# Patient Record
Sex: Female | Born: 1935 | Race: Black or African American | Hispanic: No | Smoking: Never smoker
Health system: Southern US, Community
[De-identification: ages and names within clinical notes are randomized; demographics above are authoritative.]

## PROBLEM LIST (undated history)

## (undated) ENCOUNTER — Emergency Department (HOSPITAL_COMMUNITY): Payer: Medicare Other | Source: Home / Self Care

## (undated) DIAGNOSIS — M1990 Unspecified osteoarthritis, unspecified site: Secondary | ICD-10-CM

## (undated) DIAGNOSIS — I2510 Atherosclerotic heart disease of native coronary artery without angina pectoris: Secondary | ICD-10-CM

## (undated) DIAGNOSIS — I219 Acute myocardial infarction, unspecified: Secondary | ICD-10-CM

## (undated) DIAGNOSIS — E039 Hypothyroidism, unspecified: Secondary | ICD-10-CM

## (undated) DIAGNOSIS — I639 Cerebral infarction, unspecified: Secondary | ICD-10-CM

## (undated) DIAGNOSIS — I509 Heart failure, unspecified: Secondary | ICD-10-CM

## (undated) DIAGNOSIS — I4891 Unspecified atrial fibrillation: Secondary | ICD-10-CM

## (undated) HISTORY — PX: TONSILLECTOMY: SUR1361

## (undated) HISTORY — PX: EYE SURGERY: SHX253

---

## 2002-09-21 ENCOUNTER — Encounter: Payer: Self-pay | Admitting: Ophthalmology

## 2002-09-24 ENCOUNTER — Ambulatory Visit (HOSPITAL_COMMUNITY): Admission: RE | Admit: 2002-09-24 | Discharge: 2002-09-25 | Payer: Self-pay | Admitting: Ophthalmology

## 2003-03-09 ENCOUNTER — Encounter: Payer: Self-pay | Admitting: Family Medicine

## 2003-03-09 ENCOUNTER — Ambulatory Visit (HOSPITAL_COMMUNITY): Admission: RE | Admit: 2003-03-09 | Discharge: 2003-03-09 | Payer: Self-pay | Admitting: Family Medicine

## 2003-07-08 ENCOUNTER — Ambulatory Visit (HOSPITAL_COMMUNITY): Admission: RE | Admit: 2003-07-08 | Discharge: 2003-07-08 | Payer: Self-pay | Admitting: Ophthalmology

## 2004-01-20 ENCOUNTER — Ambulatory Visit (HOSPITAL_COMMUNITY): Admission: RE | Admit: 2004-01-20 | Discharge: 2004-01-20 | Payer: Self-pay | Admitting: Ophthalmology

## 2005-05-16 ENCOUNTER — Ambulatory Visit (HOSPITAL_COMMUNITY): Admission: RE | Admit: 2005-05-16 | Discharge: 2005-05-16 | Payer: Self-pay | Admitting: Ophthalmology

## 2007-10-08 ENCOUNTER — Ambulatory Visit (HOSPITAL_COMMUNITY): Payer: Self-pay | Admitting: Family Medicine

## 2007-10-08 ENCOUNTER — Encounter (HOSPITAL_COMMUNITY): Admission: RE | Admit: 2007-10-08 | Discharge: 2007-11-07 | Payer: Self-pay | Admitting: Family Medicine

## 2008-02-23 ENCOUNTER — Ambulatory Visit (HOSPITAL_COMMUNITY): Payer: Self-pay | Admitting: Family Medicine

## 2008-02-23 ENCOUNTER — Encounter (HOSPITAL_COMMUNITY): Admission: RE | Admit: 2008-02-23 | Discharge: 2008-03-24 | Payer: Self-pay | Admitting: Family Medicine

## 2009-05-03 ENCOUNTER — Emergency Department (HOSPITAL_COMMUNITY): Admission: EM | Admit: 2009-05-03 | Discharge: 2009-05-04 | Payer: Self-pay | Admitting: Emergency Medicine

## 2010-12-16 LAB — GLUCOSE, CAPILLARY: Glucose-Capillary: 166 mg/dL — ABNORMAL HIGH (ref 70–99)

## 2011-01-26 NOTE — Op Note (Signed)
NAME:  [name], [name]                         ACCOUNT NO.:  [id number]   MEDICAL RECORD NO.:  [id number]                   PATIENT TYPE:  OIB   LOCATION:  2550                                 FACILITY:  [facility]   PHYSICIAN:  Salley Scarlet., M.D.         DATE OF BIRTH:  1921-09-08   DATE OF PROCEDURE:  01/20/2004  DATE OF DISCHARGE:  01/20/2004                                 OPERATIVE REPORT   PREOPERATIVE DIAGNOSES:  1. Glaucoma, poor control, right eye.  2. Cataract, right eye.   POSTOPERATIVE DIAGNOSES:  1. Glaucoma, poor control, right eye.  2. Cataract right eye.   OPERATION:  Trabeculectomy right eye, with extracapsular cataract extraction  and intraocular lens implantation, right eye.   ANESTHESIA:  Local using Xylocaine 2%, Marcaine 0.75%.   PROCEDURE:  Under the influence of IV sedation, a Van Lint akinesia and  retrobulbar anesthesia was given.  The patient was prepped and draped in the  usual manner.  The lid speculum was inserted under the upper and lower lid  of the right eye and a 4-0 silk traction suture was passed through the belly  of the superior rectus muscle for traction.  A limbal-based conjunctival  flap was turned and hemostasis achieved by using cautery.  A triangular  scleral flap was fashioned, having its base hinged at the limbus and being  about 6 mm in length.  After fashioning this flap, a Weck-Cel pledget soaked  in mitomycin C was allowed to sit on the sclera beneath the conjunctiva for  approximately two minutes.  The eye was then thoroughly irrigated so as to  irrigate the mitomycin C from the sclera.  The triangular scleral flap was  then dissected down into clear cornea using the crescent blade.  An incision  was made in the conjunctiva at the limbus at the 11 o'clock position.  Occucoat was injected into the eye through this incision.  An anterior  capsulotomy was done using a bent 25-gauge needle.  Then beneath the  triangular scleral  flap, a 3 x 3 section of tissue was excised so as to  remove the trabeculum and to enter the anterior chamber.  The corneoscleral  wound was then extended first toward the left, then toward the right, using  corneal scissors and placing an 8-0 Vicryl scissor across each arm of the  incision that was made respectively toward the left, then toward the right.  A third suture was placed at the apex of the triangular flap at the 12  o'clock position.  At this point the patient began to thrash around  uncontrollably and the decision was made to intubate the patient.  The  anesthesiologist was called, and the patient was intubated without  complications and the procedure was continued without complications.  The  cataract was then manually expressed from the eye and the two previously-  placed sutures were closed and the third one placed at the  12 o'clock  position.  The I/A handpiece was passed into the eye and the residual  cortical material was aspirated.  The posterior capsule was polished using  an olive-tipped polisher.  A posterior chamber lens of the EZ60 variety was  inserted into the eye behind the iris without difficulty.  The corneoscleral  wound was closed by using interrupted sutures of 8-0 Vicryl and 10-0 nylon.  The anterior chamber was reformed and the pupil was constricted using  Miochol.  Before closing the final suture, Occucoat was injected into the  eye so as to make sure that the anterior chamber stayed formed in the  immediate postop period.  After making sure that the wound was secure,  Tenon's capsule and the conjunctiva were closed over the sclera using a  running suture of 8-0 Vicryl.  Tenon's and conjunctiva were closed in  layers.  Celestone 1 mL and 0.5 mL of gentamicin were injected  subconjuctivally.  Maxitrol ophthalmic ointment and Pilopine ointment were  applied along with a patch and Fox shield.  The patient tolerated the  procedure well and was discharged to  the postanesthesia recovery room in  satisfactory condition.  She will be discharged when stable with  instructions to rest in bed today, to take Vicodin every four hours as  needed for pain, and to see me in the office tomorrow for further  evaluation.   DISCHARGE DIAGNOSES:  1. Glaucoma, poor control, right eye.  2. Cataract, right eye.                                               Salley Scarlet., M.D.    TB/MEDQ  D:  01/20/2004  T:  01/22/2004  Job:  [id number]

## 2011-01-26 NOTE — Op Note (Signed)
NAME:  [name], [name]                           ACCOUNT NO.:  [id number]   MEDICAL RECORD NO.:  [id number]                   PATIENT TYPE:  OIB   LOCATION:  5735                                 FACILITY:  [facility]   PHYSICIAN:  Salley Scarlet., M.D.         DATE OF BIRTH:  15-Feb-1921   DATE OF PROCEDURE:  DATE OF DISCHARGE:  09/25/2002                                 OPERATIVE REPORT   PREOPERATIVE DIAGNOSES:  1. Glaucoma poorly controlled left eye.  2. Cataract, left eye.   OPERATION:  1. Trabeculectomy, left eye.  2. Extracapsular extraction with intraoperative lens implantation, left eye.   SURGEON:  Nadyne Coombes, M.D.   ANESTHESIA:  Local using Xylocaine 2%, Marcaine 0.75% and Wydase.   JUSTIFICATION FOR PROCEDURE:  This is a 77 year old lady who has been  followed for several years for glaucoma.  She is on maximum tolerated  medical therapy but the pressure is still poorly controlled.  She complains  of blurring of vision.  She has developed a cataract over the course of  several years so that now she has a visual acuity best corrected of 20/50 on  the right and 20/70 on the left.  Trabeculectomy with cataract extraction  was recommended.  She is admitted at this time to undergo this procedure.   DESCRIPTION OF PROCEDURE:  Under the influence of IV sedation a Van Lint  akinesia and retrobulbar anesthesia were given.  The patient was prepped and  draped in the usual manner.  The lid speculum was inserted under the upper  and lower lid of the left eye and a 4-0 silk traction suture was passed  through the belly of the superior rectus muscle.  A limbal based  conjunctival flap was turned and hemostasis was achieved by using cautery.  A triangular scleral flap was fashioned in the sclerae approximately 1/2  thickness.  After fashioning the flap, a [place] pledget which had been  soaked in Mitomycin C was allowed to sit on the sclera beneath the  conjunctiva for  approximately two minutes.  The Mitomycin C was then  thoroughly irrigated from the eye.  The scleral flap was finished and  dissected down into clear cornea using crescent blade.  Then a 3 x 4 section  of tissue was removed from beneath the scleral flap containing the  trabeculum and entering the anterior chamber.  A peripheral iridectomy was  made in the eye.  The corneoscleral wound was then extended first toward the  right and then toward the left using corneal scissors.  A single 8-0 Vicryl  suture across the incision was made, towards the left and towards the right.  An anterior capsulotomy was done using bent 25 gauge needle.  The nucleus  was then manually expressed from the eye.  The two previously placed sutures  were closed and a third one was placed at the 12:00 position.  The eye hand  piece was passed into the eye and the residual cortical material was  aspirated.  The posterior capsule was polished using Olk's polisher. A PMMA  lens was then inserted into the eye behind the iris without difficulty.  The  anterior chamber was reformed using Miochol.  The corneoscleral wound was  closed by using a combination of interrupted sutures of 8-0 Vicryl and 10-0  Nylon packing each apex of the triangle scleral fat now down with 10-0  Nylon.  After it was ascertained that the wound was air tight but allowing  fluid to filter through the scleral wound, the conjunctiva was closed over  the sclera using running suture of 8-0 Vicryl.  Then 1 cc of Celestone and  0.25 cc of Gentamicin were injected subconjuctivally.  Atropine drops and  Maxitrol ointment were applied along with a pad and Fox shield.  The patient  tolerated the procedure well and was discharged to the postanesthesia care  unit in satisfactory condition.  She has been instructed to rest today.  Take Vicodin every four hours as-needed for pain.  To be seen in the office  tomorrow for further evaluation.   DISCHARGE  DIAGNOSES:  1. Glaucoma, poorly controlled.  2. Immature cataract, left eye.                                               Salley Scarlet., M.D.    TB/MEDQ  D:  09/30/2002  T:  10/01/2002  Job:  [id number]

## 2011-06-18 ENCOUNTER — Ambulatory Visit (HOSPITAL_COMMUNITY)
Admission: RE | Admit: 2011-06-18 | Discharge: 2011-06-18 | Disposition: A | Discharge status: Home / Self Care | Payer: Medicare Other | Source: Ambulatory Visit | Attending: Ophthalmology | Admitting: Ophthalmology

## 2011-06-18 ENCOUNTER — Encounter (HOSPITAL_COMMUNITY)
Admission: RE | Admit: 2011-06-18 | Discharge: 2011-06-18 | Disposition: A | Discharge status: Home / Self Care | Payer: Medicare Other | Source: Ambulatory Visit | Attending: Ophthalmology | Admitting: Ophthalmology

## 2011-06-18 ENCOUNTER — Other Ambulatory Visit (HOSPITAL_COMMUNITY): Payer: Self-pay | Admitting: Ophthalmology

## 2011-06-18 DIAGNOSIS — I10 Essential (primary) hypertension: Secondary | ICD-10-CM

## 2011-06-18 DIAGNOSIS — R5383 Other fatigue: Secondary | ICD-10-CM | POA: Insufficient documentation

## 2011-06-18 DIAGNOSIS — R5381 Other malaise: Secondary | ICD-10-CM | POA: Insufficient documentation

## 2011-06-18 DIAGNOSIS — Z01818 Encounter for other preprocedural examination: Secondary | ICD-10-CM | POA: Insufficient documentation

## 2011-06-18 DIAGNOSIS — R0602 Shortness of breath: Secondary | ICD-10-CM | POA: Insufficient documentation

## 2011-06-18 DIAGNOSIS — I517 Cardiomegaly: Secondary | ICD-10-CM | POA: Insufficient documentation

## 2011-06-18 LAB — BASIC METABOLIC PANEL
CO2: 25 mEq/L (ref 19–32)
Calcium: 10.5 mg/dL (ref 8.4–10.5)
GFR calc non Af Amer: 72 mL/min — ABNORMAL LOW (ref >90)
Potassium: 4.4 mEq/L (ref 3.5–5.1)
Sodium: 140 mEq/L (ref 135–145)

## 2011-06-18 LAB — CBC
HCT: 36.4 % (ref 36.0–46.0)
Hemoglobin: 12.3 g/dL (ref 12.0–15.0)
MCH: 33.3 pg (ref 26.0–34.0)
MCHC: 33.8 g/dL (ref 30.0–36.0)
MCV: 98.6 fL (ref 78.0–100.0)
Platelets: 152 10*3/uL (ref 150–400)
RBC: 3.69 MIL/uL — ABNORMAL LOW (ref 3.87–5.11)
RDW: 16 % — ABNORMAL HIGH (ref 11.5–15.5)
WBC: 7.7 10*3/uL (ref 4.0–10.5)

## 2011-06-18 LAB — SURGICAL PCR SCREEN
MRSA, PCR: NEGATIVE
Staphylococcus aureus: NEGATIVE

## 2011-06-18 LAB — PROTIME-INR: Prothrombin Time: 21.7 seconds — ABNORMAL HIGH (ref 11.6–15.2)

## 2011-06-18 LAB — APTT: aPTT: 40 seconds — ABNORMAL HIGH (ref 24–37)

## 2011-06-20 ENCOUNTER — Observation Stay (HOSPITAL_COMMUNITY)
Admission: RE | Admit: 2011-06-20 | Discharge: 2011-06-21 | Disposition: A | Discharge status: Home / Self Care | Payer: Medicare Other | Source: Ambulatory Visit | Attending: Ophthalmology | Admitting: Ophthalmology

## 2011-06-20 DIAGNOSIS — Z01818 Encounter for other preprocedural examination: Secondary | ICD-10-CM | POA: Insufficient documentation

## 2011-06-20 DIAGNOSIS — E119 Type 2 diabetes mellitus without complications: Secondary | ICD-10-CM | POA: Insufficient documentation

## 2011-06-20 DIAGNOSIS — Z01810 Encounter for preprocedural cardiovascular examination: Secondary | ICD-10-CM | POA: Insufficient documentation

## 2011-06-20 DIAGNOSIS — E039 Hypothyroidism, unspecified: Secondary | ICD-10-CM | POA: Insufficient documentation

## 2011-06-20 DIAGNOSIS — I517 Cardiomegaly: Secondary | ICD-10-CM | POA: Insufficient documentation

## 2011-06-20 DIAGNOSIS — H4020X0 Unspecified primary angle-closure glaucoma, stage unspecified: Principal | ICD-10-CM | POA: Insufficient documentation

## 2011-06-20 DIAGNOSIS — Z01812 Encounter for preprocedural laboratory examination: Secondary | ICD-10-CM | POA: Insufficient documentation

## 2011-06-20 DIAGNOSIS — I4891 Unspecified atrial fibrillation: Secondary | ICD-10-CM | POA: Insufficient documentation

## 2011-06-21 LAB — PROTIME-INR
INR: 1.53 — ABNORMAL HIGH (ref 0.00–1.49)
Prothrombin Time: 18.7 s — ABNORMAL HIGH (ref 11.6–15.2)

## 2011-06-21 LAB — GLUCOSE, CAPILLARY
Glucose-Capillary: 135 mg/dL — ABNORMAL HIGH (ref 70–99)
Glucose-Capillary: 107 mg/dL — ABNORMAL HIGH (ref 70–99)

## 2011-06-28 NOTE — Op Note (Signed)
NAMECAITLYNE, [name]               ACCOUNT NO.:  [id number]  MEDICAL RECORD NO.:  [id number]  LOCATION:                                 FACILITY:  PHYSICIAN:  Chalmers Guest, M.D.     DATE OF BIRTH:  [birth year]  DATE OF PROCEDURE:  06/20/2011 DATE OF DISCHARGE:                              OPERATIVE REPORT   PREOPERATIVE DIAGNOSIS:  Uncontrolled angle closure glaucoma, right eye associated with intraocular inflammation with pre-existing scarring from previous cataract surgery and previous glaucoma surgery.  POSTOPERATIVE DIAGNOSIS:  Uncontrolled angle closure glaucoma, right eye associated with intraocular inflammation with pre-existing scarring from previous cataract surgery and previous glaucoma surgery.  PROCEDURE:  Ahmed valve with mitomycin-C with Tutoplast graft.  COMPLICATIONS:  None.  ANESTHESIA:  Xylocaine 2% with epinephrine and 50:50 mix of 0.75% Marcaine with ampule of Wydase.  PROCEDURE IN DETAIL:  The patient was taken to the operating room, where a peribulbar block was given with the aforementioned local anesthetic agent.  Following this, pressure was applied to the eye and then the patient's face was prepped and draped in usual sterile fashion.  With the surgeon sitting superiorly, a 6-0 nylon suture was passed through clear cornea to infraduct the eye.  With the eye in intraductal position, it was noted that the superior nasal conjunctiva was extensively scarred.  An incision was made at the superior temporal limbus using a Hoskins forceps and a sharp Westcott scissors.  It was extended at the limbus using the blunt scissors.  A fornix based conjunctival flap was formed.  Bleeding was controlled with cautery. Following this, mitomycin-C 0.4 mg/mL was placed on a Gelfoam sponge and allowed to stay on the eye for 3 minutes and then irrigated with 50 mL of balanced salt solution.  At this point, the flexible plate Ahmed was taken from the packet and noted to  have no defects.  The Ahmed glaucoma valve was FP7 flexible plate, SN #[id number].  It was irrigated and it was noted that there was a hole in the tube line.  The tube was cut beneath the hole and then irrigated and appropriately planned.  It was then placed on the eye and measurements were made with the eyelets 8 mm from the corneoscleral limbus.  It was sutured to approximately 7 mm posterior to the superior temporal corneal scleral limbus using a 4-0 Mersilene suture on a spatula needle.  Following this, the patient was moving excessively during the case, therefore, the Tutoplast graft was taken from the packet.  The Tutoplast was fashioned to cover the tube. It was then irrigated.  The Tutoplast with reference [phone].  It was cut and then sewed to the sclera after the Ahmed tube had been placed into the eye.  The tube was placed in the eye after a 22-gauge needle tract was made into the anterior chamber passed in through the iris.  The tube was positioned above the iris and was not occluded.  It was sutured to the sclera with a 9-0 nylon suture.  Two interrupted nylon sutures were then used to suture the scleral patch graft.  A paracentesis tract was formed where Provisc was injected in the  eye.  Following this, the conjunctiva was then closed with 8-0 Vicryl.  The Vicryl suture was sutured on each corner and then sutured temporally.  BSS was injected in the anterior chamber to remove portions of the viscoelastic.  The bleb elevated and it was Seidel negative.  The subconjunctival injection of Kenalog 5 mg was given subconjunctivally in inferior nasal quadrant. Following this, topical TobraDex was placed on the eye.  A patch and Fox shield were placed.  The patient returned to the recovery area in stable condition.  The patient is 77 years old and has diabetes and other medical problems and was very uncomfortable at the end of the case necessitating admission to the  hospital.     Chalmers Guest, M.D.     RW/MEDQ  D:  06/20/2011  T:  06/21/2011  Job:  [id number]  Electronically Signed by Chalmers Guest M.D. on 06/28/2011 05:10:22 PM

## 2012-02-03 ENCOUNTER — Other Ambulatory Visit: Payer: Self-pay

## 2012-02-03 ENCOUNTER — Ambulatory Visit (HOSPITAL_COMMUNITY): Admit: 2012-02-03 | Payer: Self-pay | Admitting: Cardiology

## 2012-02-03 ENCOUNTER — Encounter (HOSPITAL_COMMUNITY): Payer: Self-pay | Admitting: *Deleted

## 2012-02-03 ENCOUNTER — Emergency Department (HOSPITAL_COMMUNITY): Payer: Medicare Other

## 2012-02-03 ENCOUNTER — Encounter (HOSPITAL_COMMUNITY)
Admission: EM | Disposition: A | Discharge status: Home / Self Care | Payer: Self-pay | Source: Home / Self Care | Attending: Cardiology

## 2012-02-03 ENCOUNTER — Inpatient Hospital Stay (HOSPITAL_COMMUNITY)
Admission: EM | Admit: 2012-02-03 | Discharge: 2012-02-05 | DRG: 248 | Disposition: A | Discharge status: Home / Self Care | Payer: Medicare Other | Attending: Cardiology | Admitting: Cardiology

## 2012-02-03 ENCOUNTER — Inpatient Hospital Stay (HOSPITAL_COMMUNITY): Payer: Medicare Other

## 2012-02-03 DIAGNOSIS — I10 Essential (primary) hypertension: Secondary | ICD-10-CM | POA: Diagnosis present

## 2012-02-03 DIAGNOSIS — I5021 Acute systolic (congestive) heart failure: Secondary | ICD-10-CM | POA: Diagnosis present

## 2012-02-03 DIAGNOSIS — I4891 Unspecified atrial fibrillation: Secondary | ICD-10-CM | POA: Diagnosis present

## 2012-02-03 DIAGNOSIS — I5023 Acute on chronic systolic (congestive) heart failure: Secondary | ICD-10-CM | POA: Diagnosis present

## 2012-02-03 DIAGNOSIS — M069 Rheumatoid arthritis, unspecified: Secondary | ICD-10-CM | POA: Diagnosis present

## 2012-02-03 DIAGNOSIS — I509 Heart failure, unspecified: Secondary | ICD-10-CM | POA: Diagnosis present

## 2012-02-03 DIAGNOSIS — E119 Type 2 diabetes mellitus without complications: Secondary | ICD-10-CM | POA: Diagnosis present

## 2012-02-03 DIAGNOSIS — E039 Hypothyroidism, unspecified: Secondary | ICD-10-CM | POA: Diagnosis present

## 2012-02-03 DIAGNOSIS — I252 Old myocardial infarction: Secondary | ICD-10-CM

## 2012-02-03 DIAGNOSIS — I2109 ST elevation (STEMI) myocardial infarction involving other coronary artery of anterior wall: Principal | ICD-10-CM | POA: Diagnosis present

## 2012-02-03 DIAGNOSIS — H409 Unspecified glaucoma: Secondary | ICD-10-CM | POA: Diagnosis present

## 2012-02-03 DIAGNOSIS — I213 ST elevation (STEMI) myocardial infarction of unspecified site: Secondary | ICD-10-CM

## 2012-02-03 DIAGNOSIS — I2510 Atherosclerotic heart disease of native coronary artery without angina pectoris: Secondary | ICD-10-CM | POA: Diagnosis present

## 2012-02-03 HISTORY — DX: Heart failure, unspecified: I50.9

## 2012-02-03 HISTORY — PX: PERCUTANEOUS CORONARY STENT INTERVENTION (PCI-S): SHX5485

## 2012-02-03 HISTORY — DX: Atherosclerotic heart disease of native coronary artery without angina pectoris: I25.10

## 2012-02-03 HISTORY — DX: Unspecified osteoarthritis, unspecified site: M19.90

## 2012-02-03 HISTORY — DX: Unspecified atrial fibrillation: I48.91

## 2012-02-03 HISTORY — DX: Hypothyroidism, unspecified: E03.9

## 2012-02-03 HISTORY — PX: CARDIAC CATHETERIZATION: SHX172

## 2012-02-03 HISTORY — DX: Acute myocardial infarction, unspecified: I21.9

## 2012-02-03 HISTORY — PX: LEFT HEART CATHETERIZATION WITH CORONARY ANGIOGRAM: SHX5451

## 2012-02-03 LAB — DIFFERENTIAL
Eosinophils Absolute: 0 10*3/uL (ref 0.0–0.7)
Eosinophils Relative: 0 % (ref 0–5)
Lymphocytes Relative: 28 % (ref 12–46)
Lymphs Abs: 2 10*3/uL (ref 0.7–4.0)
Monocytes Absolute: 0.5 10*3/uL (ref 0.1–1.0)
Monocytes Relative: 7 % (ref 3–12)

## 2012-02-03 LAB — POCT I-STAT TROPONIN I: Troponin i, poc: 0.08 ng/mL (ref 0.00–0.08)

## 2012-02-03 LAB — CBC
HCT: 35.5 % — ABNORMAL LOW (ref 36.0–46.0)
Hemoglobin: 11.8 g/dL — ABNORMAL LOW (ref 12.0–15.0)
MCH: 31.5 pg (ref 26.0–34.0)
MCV: 94.7 fL (ref 78.0–100.0)
RBC: 3.75 MIL/uL — ABNORMAL LOW (ref 3.87–5.11)

## 2012-02-03 LAB — POCT I-STAT, CHEM 8
Creatinine, Ser: 0.7 mg/dL (ref 0.50–1.10)
Hemoglobin: 13.3 g/dL (ref 12.0–15.0)
Potassium: 4.5 mEq/L (ref 3.5–5.1)
Sodium: 142 mEq/L (ref 135–145)
TCO2: 21 mmol/L (ref 0–100)

## 2012-02-03 LAB — MRSA PCR SCREENING: MRSA by PCR: NEGATIVE

## 2012-02-03 SURGERY — LEFT HEART CATHETERIZATION WITH CORONARY ANGIOGRAM

## 2012-02-03 MED ORDER — CARVEDILOL 3.125 MG PO TABS
3.1250 mg | ORAL_TABLET | Freq: Two times a day (BID) | ORAL | Status: DC
  Administered 2012-02-04 (×2): 3.125 mg via ORAL
  Filled 2012-02-03 (×5): qty 1

## 2012-02-03 MED ORDER — EPTIFIBATIDE 75 MG/100ML IV SOLN
1.0000 ug/kg/min | INTRAVENOUS | Status: AC
  Administered 2012-02-03: 1 ug/kg/min via INTRAVENOUS
  Filled 2012-02-03 (×2): qty 100

## 2012-02-03 MED ORDER — CLOPIDOGREL BISULFATE 300 MG PO TABS
ORAL_TABLET | ORAL | Status: AC
  Filled 2012-02-03: qty 2

## 2012-02-03 MED ORDER — METHOTREXATE 2.5 MG PO TABS
10.0000 mg | ORAL_TABLET | ORAL | Status: DC
  Administered 2012-02-03: 10 mg via ORAL
  Filled 2012-02-03: qty 4

## 2012-02-03 MED ORDER — BIMATOPROST 0.03 % OP SOLN
1.0000 [drp] | Freq: Every day | OPHTHALMIC | Status: DC
  Filled 2012-02-03: qty 2.5

## 2012-02-03 MED ORDER — SODIUM CHLORIDE 0.9 % IV SOLN
1.0000 mL/kg/h | INTRAVENOUS | Status: DC
  Administered 2012-02-03: 1 mL/kg/h via INTRAVENOUS

## 2012-02-03 MED ORDER — FENTANYL CITRATE 0.05 MG/ML IJ SOLN
INTRAMUSCULAR | Status: AC
  Filled 2012-02-03: qty 2

## 2012-02-03 MED ORDER — SODIUM CHLORIDE 0.9 % IV BOLUS (SEPSIS): 500.0000 mL | Freq: Once | INTRAVENOUS | Status: DC

## 2012-02-03 MED ORDER — NITROGLYCERIN 0.4 MG SL SUBL: 0.4000 mg | SUBLINGUAL_TABLET | SUBLINGUAL | Status: DC | PRN

## 2012-02-03 MED ORDER — HEPARIN (PORCINE) IN NACL 2-0.9 UNIT/ML-% IJ SOLN
INTRAMUSCULAR | Status: AC
  Filled 2012-02-03: qty 1000

## 2012-02-03 MED ORDER — ONDANSETRON HCL 4 MG/2ML IJ SOLN: 4.0000 mg | Freq: Four times a day (QID) | INTRAMUSCULAR | Status: DC | PRN

## 2012-02-03 MED ORDER — SODIUM CHLORIDE 0.9 % IJ SOLN: 3.0000 mL | INTRAMUSCULAR | Status: DC | PRN

## 2012-02-03 MED ORDER — SODIUM CHLORIDE 0.9 % IJ SOLN
3.0000 mL | Freq: Two times a day (BID) | INTRAMUSCULAR | Status: DC
  Administered 2012-02-04 (×2): 3 mL via INTRAVENOUS

## 2012-02-03 MED ORDER — GLIPIZIDE ER 5 MG PO TB24
5.0000 mg | ORAL_TABLET | Freq: Every day | ORAL | Status: DC
  Administered 2012-02-04 – 2012-02-05 (×2): 5 mg via ORAL
  Filled 2012-02-03 (×3): qty 1

## 2012-02-03 MED ORDER — POTASSIUM CHLORIDE CRYS ER 10 MEQ PO TBCR
10.0000 meq | EXTENDED_RELEASE_TABLET | Freq: Every day | ORAL | Status: DC
  Administered 2012-02-03 – 2012-02-05 (×3): 10 meq via ORAL
  Filled 2012-02-03 (×3): qty 1

## 2012-02-03 MED ORDER — HEPARIN BOLUS VIA INFUSION: 2500.0000 [IU] | Freq: Once | INTRAVENOUS | Status: DC

## 2012-02-03 MED ORDER — BRINZOLAMIDE 1 % OP SUSP
1.0000 [drp] | Freq: Three times a day (TID) | OPHTHALMIC | Status: DC
  Filled 2012-02-03: qty 10

## 2012-02-03 MED ORDER — LIDOCAINE HCL (PF) 1 % IJ SOLN
INTRAMUSCULAR | Status: AC
  Filled 2012-02-03: qty 30

## 2012-02-03 MED ORDER — CLOPIDOGREL BISULFATE 75 MG PO TABS
75.0000 mg | ORAL_TABLET | Freq: Every day | ORAL | Status: DC
  Administered 2012-02-04 – 2012-02-05 (×2): 75 mg via ORAL
  Filled 2012-02-03 (×2): qty 1

## 2012-02-03 MED ORDER — LEVOTHYROXINE SODIUM 125 MCG PO TABS
125.0000 ug | ORAL_TABLET | Freq: Every day | ORAL | Status: DC
  Administered 2012-02-04 – 2012-02-05 (×2): 125 ug via ORAL
  Filled 2012-02-03 (×3): qty 1

## 2012-02-03 MED ORDER — EPTIFIBATIDE 75 MG/100ML IV SOLN
INTRAVENOUS | Status: AC
  Administered 2012-02-03: 1 ug/kg/min via INTRAVENOUS
  Filled 2012-02-03: qty 100

## 2012-02-03 MED ORDER — CEFAZOLIN SODIUM 1-5 GM-% IV SOLN
INTRAVENOUS | Status: AC
  Filled 2012-02-03: qty 50

## 2012-02-03 MED ORDER — SODIUM CHLORIDE 0.9 % IV SOLN
0.2500 mg/kg/h | INTRAVENOUS | Status: DC
  Filled 2012-02-03: qty 250

## 2012-02-03 MED ORDER — MORPHINE SULFATE 4 MG/ML IJ SOLN: 4.0000 mg | Freq: Once | INTRAMUSCULAR | Status: DC

## 2012-02-03 MED ORDER — METOPROLOL TARTRATE 1 MG/ML IV SOLN
INTRAVENOUS | Status: AC
  Filled 2012-02-03: qty 5

## 2012-02-03 MED ORDER — BIMATOPROST 0.01 % OP SOLN
1.0000 [drp] | Freq: Every day | OPHTHALMIC | Status: DC
  Administered 2012-02-03 – 2012-02-04 (×2): 1 [drp] via OPHTHALMIC
  Filled 2012-02-03: qty 2.5

## 2012-02-03 MED ORDER — NITROGLYCERIN 0.2 MG/ML ON CALL CATH LAB
INTRAVENOUS | Status: AC
  Filled 2012-02-03: qty 1

## 2012-02-03 MED ORDER — ATORVASTATIN CALCIUM 40 MG PO TABS
40.0000 mg | ORAL_TABLET | Freq: Every day | ORAL | Status: DC
  Administered 2012-02-03 – 2012-02-04 (×2): 40 mg via ORAL
  Filled 2012-02-03 (×3): qty 1

## 2012-02-03 MED ORDER — METFORMIN HCL 500 MG PO TABS: 500.0000 mg | ORAL_TABLET | Freq: Two times a day (BID) | ORAL | Status: DC

## 2012-02-03 MED ORDER — HEPARIN BOLUS VIA INFUSION
4000.0000 [IU] | Freq: Once | INTRAVENOUS | Status: AC
  Administered 2012-02-03: 4000 [IU] via INTRAVENOUS

## 2012-02-03 MED ORDER — ALLOPURINOL 100 MG PO TABS
100.0000 mg | ORAL_TABLET | Freq: Every day | ORAL | Status: DC
  Administered 2012-02-03 – 2012-02-05 (×3): 100 mg via ORAL
  Filled 2012-02-03 (×3): qty 1

## 2012-02-03 MED ORDER — SODIUM CHLORIDE 0.9 % IV SOLN: 250.0000 mL | INTRAVENOUS | Status: DC

## 2012-02-03 MED ORDER — METOPROLOL TARTRATE 1 MG/ML IV SOLN
5.0000 mg | Freq: Once | INTRAVENOUS | Status: AC
  Administered 2012-02-03: 5 mg via INTRAVENOUS

## 2012-02-03 MED ORDER — HEPARIN SODIUM (PORCINE) 5000 UNIT/ML IJ SOLN
INTRAMUSCULAR | Status: AC
  Filled 2012-02-03: qty 1

## 2012-02-03 MED ORDER — BRIMONIDINE TARTRATE 0.2 % OP SOLN
1.0000 [drp] | Freq: Two times a day (BID) | OPHTHALMIC | Status: DC
  Administered 2012-02-03 – 2012-02-05 (×4): 1 [drp] via OPHTHALMIC
  Filled 2012-02-03: qty 5

## 2012-02-03 MED ORDER — HEPARIN (PORCINE) IN NACL 100-0.45 UNIT/ML-% IJ SOLN
850.0000 [IU]/h | INTRAMUSCULAR | Status: DC
  Filled 2012-02-03: qty 250

## 2012-02-03 MED ORDER — SODIUM CHLORIDE 0.9 % IV BOLUS (SEPSIS)
500.0000 mL | Freq: Once | INTRAVENOUS | Status: AC
  Administered 2012-02-03: 500 mL via INTRAVENOUS

## 2012-02-03 MED ORDER — TIMOLOL MALEATE 0.5 % OP SOLN
1.0000 [drp] | Freq: Two times a day (BID) | OPHTHALMIC | Status: DC
  Administered 2012-02-03 – 2012-02-05 (×4): 1 [drp] via OPHTHALMIC
  Filled 2012-02-03: qty 5

## 2012-02-03 MED ORDER — LEVOTHYROXINE SODIUM 125 MCG PO TABS
125.0000 ug | ORAL_TABLET | Freq: Every day | ORAL | Status: DC
  Filled 2012-02-03: qty 1

## 2012-02-03 MED ORDER — GLIPIZIDE ER 5 MG PO TB24
5.0000 mg | ORAL_TABLET | Freq: Every day | ORAL | Status: DC
  Filled 2012-02-03: qty 1

## 2012-02-03 MED ORDER — COLCHICINE 0.6 MG PO TABS
0.6000 mg | ORAL_TABLET | Freq: Two times a day (BID) | ORAL | Status: DC
  Administered 2012-02-03 – 2012-02-05 (×4): 0.6 mg via ORAL
  Filled 2012-02-03 (×5): qty 1

## 2012-02-03 MED ORDER — ACETAMINOPHEN 325 MG PO TABS
650.0000 mg | ORAL_TABLET | ORAL | Status: DC | PRN
  Administered 2012-02-04: 650 mg via ORAL
  Filled 2012-02-03: qty 2

## 2012-02-03 MED ORDER — BIVALIRUDIN 250 MG IV SOLR
INTRAVENOUS | Status: AC
  Filled 2012-02-03: qty 250

## 2012-02-03 MED ORDER — BRIMONIDINE TARTRATE-TIMOLOL 0.2-0.5 % OP SOLN: 1.0000 [drp] | Freq: Two times a day (BID) | OPHTHALMIC | Status: DC

## 2012-02-03 MED ORDER — CHOLECALCIFEROL 10 MCG (400 UNIT) PO TABS
400.0000 [IU] | ORAL_TABLET | Freq: Every morning | ORAL | Status: DC
  Administered 2012-02-04 – 2012-02-05 (×2): 400 [IU] via ORAL
  Filled 2012-02-03 (×2): qty 1

## 2012-02-03 MED ORDER — ASPIRIN 81 MG PO CHEW
81.0000 mg | CHEWABLE_TABLET | Freq: Every day | ORAL | Status: DC
  Administered 2012-02-04 – 2012-02-05 (×2): 81 mg via ORAL
  Filled 2012-02-03 (×2): qty 1

## 2012-02-03 MED ORDER — NICARDIPINE HCL 2.5 MG/ML IV SOLN
INTRAVENOUS | Status: AC
  Filled 2012-02-03: qty 10

## 2012-02-03 NOTE — Progress Notes (Signed)
MEDICATION RELATED CONSULT NOTE - INITIAL   Pharmacy Consult for Integrelin for 12 hours Indication: STEMI  No Known Allergies  Patient Measurements: Height: 5\' 3"  (160 cm) (estimate) Weight: 130 lb (58.968 kg) (estimate) IBW/kg (Calculated) : 52.4    Vital Signs: BP: 121/69 mmHg (05/26 1756) Intake/Output from previous day:   Intake/Output from this shift:    Labs:  Basename 02/03/12 1751 02/03/12 1732  WBC -- 6.9  HGB 13.3 11.8*  HCT 39.0 35.5*  PLT -- 158  APTT -- --  CREATININE 0.70 --  LABCREA -- --  CREATININE 0.70 --  CREAT24HRUR -- --  MG -- --  PHOS -- --  ALBUMIN -- --  PROT -- --  ALBUMIN -- --  AST -- --  ALT -- --  ALKPHOS -- --  BILITOT -- --  BILIDIR -- --  IBILI -- --   Estimated Creatinine Clearance: 39.4 ml/min (by C-G formula based on Cr of 0.7).   Microbiology: No results found for this or any previous visit (from the past 720 hour(s)).  Medical History: Past Medical History  Diagnosis Date  . Diabetes mellitus   . Glaucoma   . A-fib   . Myocardial infarction     3.5x90mm Veriflex stent 02/03/2012    Medications:  Prescriptions prior to admission  Medication Sig Dispense Refill  . allopurinol (ZYLOPRIM) 100 MG tablet Take 100 mg by mouth daily.      . bimatoprost (LUMIGAN) 0.03 % ophthalmic solution Place 1 drop into both eyes at bedtime.      . brimonidine-timolol (COMBIGAN) 0.2-0.5 % ophthalmic solution Place 1 drop into the right eye every 12 (twelve) hours.      . brinzolamide (AZOPT) 1 % ophthalmic suspension Place 1 drop into the right eye 3 (three) times daily.      . cholecalciferol (VITAMIN D) 400 UNITS TABS Take by mouth.      . colchicine 0.6 MG tablet Take 0.6 mg by mouth 2 (two) times daily.      Marland Kitchen glipiZIDE (GLUCOTROL XL) 5 MG 24 hr tablet Take 5 mg by mouth daily.      Marland Kitchen levothyroxine (SYNTHROID, LEVOTHROID) 125 MCG tablet Take 125 mcg by mouth daily.      . metFORMIN (GLUMETZA) 500 MG (MOD) 24 hr tablet Take  500-1,000 mg by mouth 2 (two) times daily with a meal. Takes 2 tablets (1000 mg) in the morning and 1 tablet (500 mg) in the evening      . methotrexate (RHEUMATREX) 2.5 MG tablet Take 10 mg by mouth once a week. Caution:Chemotherapy. Protect from light.      . potassium chloride (K-DUR,KLOR-CON) 10 MEQ tablet Take 10 mEq by mouth daily.        Assessment: 78 yo presents with code STEMI s/p PCI to continue integrelin 12 hours.  Goal of Therapy:  Adequate platelet inhibition  Plan:  Cont integrelin at 1 mcg/kg/min for 12 hours. Check CBC in 6 hours.   Talbert Cage Poteet 02/03/2012,8:21 PM

## 2012-02-03 NOTE — ED Notes (Addendum)
Cardio MD and cath lab at bedside

## 2012-02-03 NOTE — ED Provider Notes (Signed)
History     CSN: [id number]  Arrival date & time 02/03/12  1726   First MD Initiated Contact with Patient 02/03/12 1733      Chief Complaint  Patient presents with  . Code STEMI    (Consider location/radiation/quality/duration/timing/severity/associated sxs/prior treatment) HPI Comments: After eating today developed R sided chest pressure.  STEMI on EKG per EMS.  VSS in route.  Patient is a 78 y.o. female presenting with chest pain. The history is provided by the patient.  Chest Pain Episode onset: 1 hour ago. Duration of episode(s) is 1 hour. Chest pain occurs constantly. The chest pain is unchanged. Associated with: anything. The severity of the pain is moderate. Radiates to: R back/side of chest. Pertinent negatives for primary symptoms include no shortness of breath, no cough, no abdominal pain, no nausea and no vomiting. She tried nitroglycerin (improvement) for the symptoms.     Past Medical History  Diagnosis Date  . Diabetes mellitus   . Glaucoma   . A-fib     Past Surgical History  Procedure Date  . Knee surgery   . Eye surgery     History reviewed. No pertinent family history.  History  Substance Use Topics  . Smoking status: Never Smoker   . Smokeless tobacco: Not on file  . Alcohol Use: No    OB History    Grav Para Term Preterm Abortions TAB SAB Ect Mult Living                  Review of Systems  Constitutional: Negative for activity change.  HENT: Negative for congestion.   Eyes: Negative for visual disturbance.  Respiratory: Negative for cough, chest tightness and shortness of breath.   Cardiovascular: Positive for chest pain. Negative for leg swelling.  Gastrointestinal: Negative for nausea, vomiting and abdominal pain.  Genitourinary: Negative for dysuria.  Skin: Negative for rash.  Neurological: Negative for syncope.  Psychiatric/Behavioral: Negative for behavioral problems.    Allergies  Review of patient's allergies indicates not on  file.  Home Medications  No current outpatient prescriptions on file.  BP 121/83  Resp 17  Ht 5\' 3"  (1.6 m)  Wt 130 lb (58.968 kg)  BMI 23.03 kg/m2  SpO2 96%  Physical Exam  Constitutional: She is oriented to person, place, and time. She appears well-developed and well-nourished.       uncomfotable appearing.  HENT:  Head: Normocephalic and atraumatic.  Eyes: Conjunctivae and EOM are normal. Pupils are equal, round, and reactive to light. No scleral icterus.  Neck: Normal range of motion. Neck supple.  Cardiovascular: Regular rhythm.  Exam reveals no gallop and no friction rub.   No murmur heard.      tachy  Pulmonary/Chest: Effort normal and breath sounds normal. No respiratory distress. She has no wheezes. She has no rales. She exhibits no tenderness.  Abdominal: Soft. She exhibits no distension and no mass. There is no tenderness. There is no rebound and no guarding.  Musculoskeletal: Normal range of motion.  Neurological: She is alert and oriented to person, place, and time. She has normal reflexes. No cranial nerve deficit.  Skin: Skin is warm and dry. No rash noted.  Psychiatric: She has a normal mood and affect. Her behavior is normal. Judgment and thought content normal.    ED Course  Procedures (including critical care time)  Labs Reviewed  POCT I-STAT, CHEM 8 - Abnormal; Notable for the following:    Glucose, Bld 243 (*)  All other components within normal limits  POCT I-STAT TROPONIN I  CBC  DIFFERENTIAL  PRO B NATRIURETIC PEPTIDE   No results found.   1. STEMI (ST elevation myocardial infarction)       MDM  After eating today developed R sided chest pressure.  STEMI on EKG per EMS.  VSS in route.  Tachycardic on arrival.  HDS.  Asa given.  Nitro, morphine given.  Heparin bolus ordered.  HR slowed to 80s with lopressor.  Code STEMI called in route.  Cards to take to cath lab.        Army [place], MD 02/03/12 [phone]

## 2012-02-03 NOTE — ED Notes (Signed)
Pt very poor historian on PMH

## 2012-02-03 NOTE — CV Procedure (Addendum)
Procedure performed:   Left heart catheterization including hemodynamic monitoring of the left ventricle,LV gram, selective right and left coronary arteriography. PTCA and thrombectomy using xpress-way (unsuccessful) and Pronto catheters (successful), PTCA and stenting of the mid LAD with implantation of a non-drug-eluting 2.5 x 24 mm bare-metal Veriflex stent. Right femoral arteriogram and closure of right femoral arterial access with Perclose. Unsuccessful closure hence FemoStop was applied.  Indication patient is a 78 year-old woman with history of hypertension,   Diabetes Mellitus   who presents with acute ST elevation myocardial infarction. Was emergently brought to the cardiac catheterization lab to evaluate her coronary anatomy.  Hemodynamic data:  Left ventricular pressure was 106/13 with LVEDP of 22 mm mercury. Aortic pressure was 105/54 with a mean of 75 mm mercury.  Left ventricle: Performed in the RAO projection revealed LVEF of 30-35% There was no MR. mid to distal anterior anterolateral apical and inferoapical akinesis.  Right coronary artery: The vessel is smooth, normal,  Dominant.  Left main coronary artery is large and normal.  Circumflex coronary artery: A large vessel giving origin to a large obtuse marginal 1.   Ramus intermediate: This is a moderate-sized vessel which is smooth and normal.  LAD:  LAD gives origin to a moderate sized  diagonal-1.  LAD is occluded in the midsegment.  Interventional data: Extremely difficult case. Patient had significant thrombus burden and in spite of multiple catheter passage is unable to extract any kind of thrombus with Xpressway catheter. At the use Pronto-LP catheter and after multiple passes I was able to successfully established TIMI-3 flow except at the apex which had 0 flow. A large part of the myocardium was salvaged.  Successful PTCA and stenting of the mid LAD with a 3.5 x 24 mm Veriflex stent. Stenosis reduced to 100% to 0%  and TIMI flow improved from TIMI 0 to TIMI 3 flow.  Technique: Technique: Under sterile precautions using a 6 French right femoral arterial access, a 6 French sheath was introduced into the right femoral artery under fluoroscopy guidance. A 6 [place] multipurpose B2 catheter was advanced into the ascending aorta  right coronary artery was cannulated, a 6 [place] FLP 0.5 guide catheter was utilized then left coronary artery was cannulated and angiography was performed in multiple views. After completing the angioplasty I performed left the program to evaluate her LV systolic function for better management of the patient post PCI. MP-B-2 Catheter was used to perform LV gram which was performed in RAO projection. Catheter exchanged out of the body over J-Wire. NO immediate complications noted. Patient tolerated the procedure well.     Technique of intervention:  Using a 6 [place] FL 3.5  guide catheter the LM  coronary  was selected and cannulated. Using Angiomax for anticoagulation, I utilized a Cougar guidewire and across the LAD coronary artery without any difficulty. I placed the tip of the wire into the distal  coronary artery. Angiography was performed.   Then I utilized a Xpressway aspiration catheter and because of inability to be successful in establishing any flow I performed balloon angioplasty with a 3.0 x 15 mm emerge balloon, I again utilized Pronto-LP ASPIRATION CATHETER AND THIS TIME I WAS SUCCESSFUL IN ASPIRATION OF LARGE AMOUNT OF THROMBUS WHICH IS WELL ORGANIZED.   I then utilized the 0.0 x 15 mm emerge balloon. I performed balloon angioplasty at 8-14 pressure x 5 for 20-50 seconds each.   I proceeded with implantation of a 3.5 x 24 mm Veriflex  non-  drug-eluting stent into the mid LAD  coronary artery. The stent was deployed at 8x55 Sec then 14x22 Sec  Post-balloon angioplasty results were excellent with 0% residual stenoses and TIMI-3 flow was maintained, Except at the very tip which  there was no flow in this lesion was left alone. There was no evidence of edge dissection. The guidewire was withdrawn out of the body and the guide catheter was engaged and pulled out of the body over the J-wire the was no immediate complication. Patient tolerated the procedure well.  Disposition: Patient will be  admitted to the intensive care unit for further observation.  Will need Dual antiplatelet therapy with Plavix and ASA 81 mg for at least 1 year.

## 2012-02-03 NOTE — H&P (Addendum)
[name] is an 78 y.o. female.   Chief Complaint: Chest pain onset around 2:30 to 3:00 this afternoon  PCP: Michelle Nasuti, MD HPI: Patient is a 78 year old African American female with history of hypertension, diabetes mellitus who is admitted to the hospital via EMS for anterolateral acute myocardial infarction. She was also found to be in atrial fibrillation with rapid ventricular response on initial contact. EKG revealed ST segment elevations with hyperacute T waves in V2 to V6 with reciprocal changes in the inferior leads. There were also Q waves noted in V1 to V6. Old EKG revealed poor progression and pulmonary disease pattern. The EKG changes are new. Hence was brought directly to the cardiac catheterization lab to evaluate her coronary anatomy. She had started to feel better while she was in the emergency room.  Past Medical History  Diagnosis Date  . Diabetes mellitus   . Glaucoma   . A-fib   . Myocardial infarction     Past Surgical History  Procedure Date  . Knee surgery   . Eye surgery     History reviewed. No pertinent family history. Social History:  reports that she has never smoked. She does not have any smokeless tobacco history on file. She reports that she does not drink alcohol. Her drug history not on file.  Allergies: No Known Allergies  Medications Prior to Admission  Medication Sig Dispense Refill  . allopurinol (ZYLOPRIM) 100 MG tablet Take 100 mg by mouth daily.      . bimatoprost (LUMIGAN) 0.03 % ophthalmic solution Place 1 drop into both eyes at bedtime.      . brimonidine-timolol (COMBIGAN) 0.2-0.5 % ophthalmic solution Place 1 drop into the right eye every 12 (twelve) hours.      . brinzolamide (AZOPT) 1 % ophthalmic suspension Place 1 drop into the right eye 3 (three) times daily.      . cholecalciferol (VITAMIN D) 400 UNITS TABS Take by mouth.      . colchicine 0.6 MG tablet Take 0.6 mg by mouth 2 (two) times daily.      Marland Kitchen glipiZIDE (GLUCOTROL XL)  5 MG 24 hr tablet Take 5 mg by mouth daily.      Marland Kitchen levothyroxine (SYNTHROID, LEVOTHROID) 125 MCG tablet Take 125 mcg by mouth daily.      . metFORMIN (GLUMETZA) 500 MG (MOD) 24 hr tablet Take 500-1,000 mg by mouth 2 (two) times daily with a meal. Takes 2 tablets (1000 mg) in the morning and 1 tablet (500 mg) in the evening      . methotrexate (RHEUMATREX) 2.5 MG tablet Take 10 mg by mouth once a week. Caution:Chemotherapy. Protect from light.      . potassium chloride (K-DUR,KLOR-CON) 10 MEQ tablet Take 10 mEq by mouth daily.        Results for orders placed during the hospital encounter of 02/03/12 (from the past 48 hour(s))  CBC     Status: Abnormal   Collection Time   02/03/12  5:32 PM      Component Value Range Comment   WBC 6.9  4.0 - 10.5 (K/uL)    RBC 3.75 (*) 3.87 - 5.11 (MIL/uL)    Hemoglobin 11.8 (*) 12.0 - 15.0 (g/dL)    HCT 16.1 (*) 09.6 - 46.0 (%)    MCV 94.7  78.0 - 100.0 (fL)    MCH 31.5  26.0 - 34.0 (pg)    MCHC 33.2  30.0 - 36.0 (g/dL)    RDW 04.5 (*)  11.5 - 15.5 (%)    Platelets 158  150 - 400 (K/uL)   DIFFERENTIAL     Status: Normal   Collection Time   02/03/12  5:32 PM      Component Value Range Comment   Neutrophils Relative 65  43 - 77 (%)    Neutro Abs 4.5  1.7 - 7.7 (K/uL)    Lymphocytes Relative 28  12 - 46 (%)    Lymphs Abs 2.0  0.7 - 4.0 (K/uL)    Monocytes Relative 7  3 - 12 (%)    Monocytes Absolute 0.5  0.1 - 1.0 (K/uL)    Eosinophils Relative 0  0 - 5 (%)    Eosinophils Absolute 0.0  0.0 - 0.7 (K/uL)    Basophils Relative 0  0 - 1 (%)    Basophils Absolute 0.0  0.0 - 0.1 (K/uL)   PRO B NATRIURETIC PEPTIDE     Status: Abnormal   Collection Time   02/03/12  5:33 PM      Component Value Range Comment   Pro B Natriuretic peptide (BNP) 1499.0 (*) 0 - 450 (pg/mL)   POCT I-STAT TROPONIN I     Status: Normal   Collection Time   02/03/12  5:49 PM      Component Value Range Comment   Troponin i, poc 0.08  0.00 - 0.08 (ng/mL)    Comment 3            POCT  I-STAT, CHEM 8     Status: Abnormal   Collection Time   02/03/12  5:51 PM      Component Value Range Comment   Sodium 142  135 - 145 (mEq/L)    Potassium 4.5  3.5 - 5.1 (mEq/L)    Chloride 108  96 - 112 (mEq/L)    BUN 17  6 - 23 (mg/dL)    Creatinine, Ser 0.45  0.50 - 1.10 (mg/dL)    Glucose, Bld 409 (*) 70 - 99 (mg/dL)    Calcium, Ion 8.11  1.12 - 1.32 (mmol/L)    TCO2 21  0 - 100 (mmol/L)    Hemoglobin 13.3  12.0 - 15.0 (g/dL)    HCT 91.4  78.2 - 95.6 (%)    No results found.  Review of systems: A brief review of systems was performed and there was no history of stroke, no history of recent GI bleed, no major surgeries in the last 3-4 years. Other systems are negative.  Blood pressure 121/69, resp. rate 17, height 5\' 3"  (1.6 m), weight 58.968 kg (130 lb), SpO2 100.00%. General appearance: alert, cooperative, appears stated age and mild distress Eyes: conjunctivae/corneas clear. PERRL, EOM's intact. Fundi benign. Neck: no adenopathy, no carotid bruit, no JVD, supple, symmetrical, trachea midline and thyroid not enlarged, symmetric, no tenderness/mass/nodules Neck: JVP - normal, carotids 2+= without bruits Resp: clear to auscultation bilaterally Chest wall: no tenderness Cardio: S1, S2 normal and 2/6 systolic ejection murmur heard in the apical region. There is no gallop. GI: soft, non-tender; bowel sounds normal; no masses,  no organomegaly Extremities: extremities normal, atraumatic, no cyanosis or edema Pulses: 2+ and symmetric Skin: Skin color, texture, turgor normal. No rashes or lesions Neurologic: Alert and oriented X 3, normal strength and tone. Normal symmetric reflexes. Normal coordination and gait   Assessment/Plan  1. Acute anterolateral wall myocardial infarction onset at least 2-3 hours prior to presentation. Still has hyperacute T wave changes noted in anterolateral leads. 2. A. Fibrillation with  RVR. Duration unknown.  Recommendation I have met the patient's  family including her grandson who she lives with and discussed with him regarding proceeding with cardiac catheterization. I may further condition after this.  Pamella Pert, MD 02/03/2012, 7:40 PM

## 2012-02-03 NOTE — ED Notes (Signed)
Pt to cath lab with cardio MD and RN, family at bedside, belongings to family

## 2012-02-03 NOTE — ED Notes (Signed)
EDP and resident at bedside, pt on zoll and ekg being repeated

## 2012-02-03 NOTE — ED Provider Notes (Signed)
I saw and evaluated the patient, reviewed the resident's note and I agree with the findings and plan.   .Face to face Exam:  General:  Awake HEENT:  Atraumatic Resp:  Normal effort Abd:  Nondistended Neuro:No focal weakness Lymph: No adenopathy   CRITICAL CARE Performed by: Nelva Nay L   Total critical care time: 30 min  Critical care time was exclusive of separately billable procedures and treating other patients.  Critical care was necessary to treat or prevent imminent or life-threatening deterioration.  Critical care was time spent personally by me on the following activities: development of treatment plan with patient and/or surrogate as well as nursing, discussions with consultants, evaluation of patient's response to treatment, examination of patient, obtaining history from patient or surrogate, ordering and performing treatments and interventions, ordering and review of laboratory studies, ordering and review of radiographic studies, pulse oximetry and re-evaluation of patient's condition.   Nelia Shi, MD 02/03/12 1946

## 2012-02-03 NOTE — Progress Notes (Signed)
PHARMACIST - PHYSICIAN COMMUNICATION DR:  Jacinto Halim CONCERNING:  METFORMIN SAFE ADMINISTRATION POLICY  RECOMMENDATION: Metformin has been placed on DISCONTINUE (rejected order) STATUS and should be reordered only after any of the conditions below are ruled out.  Current safety recommendations include avoiding metformin for a minimum of 48 hours after the patient's exposure to intravenous contrast media.  DESCRIPTION:  The Pharmacy Committee has adopted a policy that restricts the use of metformin in hospitalized patients until all the contraindications to administration have been ruled out. Specific contraindications are: []  Serum creatinine ? 1.5 for males []  Serum creatinine ? 1.4 for females [x]  Shock, acute MI, sepsis, hypoxemia, dehydration []  Planned administration of intravenous iodinated contrast media []  Heart Failure patients with low EF []  Acute or chronic metabolic acidosis (including DKA)     This patient received contrast during her emergent cath this evening. Will plan to hold metformin for a minimum of 48 hours -- please re-order when patient should be resumed.  Georgina Pillion, PharmD, BCPS Clinical Pharmacist Pager: [phone] 02/03/2012 8:25 PM

## 2012-02-03 NOTE — Progress Notes (Signed)
Patient [name], 78 year old African American female arrived at Cath lab after incurring heart attack.  After stemi procedure, patient moved to room 2910.  Patient's family thanked Orthoptist for providing pastoral prayer, presence, and conversation.  For follow-up, I will refer patient to the unit Chaplain.

## 2012-02-03 NOTE — ED Notes (Signed)
EMS-right sided chest pain started this afternoon with no associated symptoms, pt states she thought it was a gas bubble.  324 asa given en route. afib on monitor 80-130. Pt given 4 nitros en route, pt is now pain free. 20(R)AC. No hx of stent or cabg. BO 130 systolic.

## 2012-02-04 DIAGNOSIS — I4891 Unspecified atrial fibrillation: Secondary | ICD-10-CM | POA: Diagnosis present

## 2012-02-04 LAB — COMPREHENSIVE METABOLIC PANEL
Albumin: 3.2 g/dL — ABNORMAL LOW (ref 3.5–5.2)
Alkaline Phosphatase: 75 U/L (ref 39–117)
BUN: 16 mg/dL (ref 6–23)
Potassium: 4.6 mEq/L (ref 3.5–5.1)
Sodium: 138 mEq/L (ref 135–145)
Total Protein: 6.2 g/dL (ref 6.0–8.3)

## 2012-02-04 LAB — CBC
HCT: 31.7 % — ABNORMAL LOW (ref 36.0–46.0)
MCV: 94.6 fL (ref 78.0–100.0)
RBC: 3.35 MIL/uL — ABNORMAL LOW (ref 3.87–5.11)
WBC: 6.8 10*3/uL (ref 4.0–10.5)

## 2012-02-04 LAB — BASIC METABOLIC PANEL
BUN: 15 mg/dL (ref 6–23)
CO2: 20 mEq/L (ref 19–32)
Chloride: 108 mEq/L (ref 96–112)
Creatinine, Ser: 0.64 mg/dL (ref 0.50–1.10)
Glucose, Bld: 147 mg/dL — ABNORMAL HIGH (ref 70–99)

## 2012-02-04 LAB — LIPID PANEL
LDL Cholesterol: 39 mg/dL (ref 0–99)
Triglycerides: 80 mg/dL (ref <150)
VLDL: 16 mg/dL (ref 0–40)

## 2012-02-04 LAB — CARDIAC PANEL(CRET KIN+CKTOT+MB+TROPI)
Relative Index: 9.5 — ABNORMAL HIGH (ref 0.0–2.5)
Relative Index: 9.6 — ABNORMAL HIGH (ref 0.0–2.5)
Relative Index: 9 — ABNORMAL HIGH (ref 0.0–2.5)
Total CK: 1171 U/L — ABNORMAL HIGH (ref 7–177)
Total CK: 845 U/L — ABNORMAL HIGH (ref 7–177)
Troponin I: >25 ng/mL (ref <0.30)
Troponin I: >25 ng/mL (ref <0.30)

## 2012-02-04 MED ORDER — RAMIPRIL 1.25 MG PO CAPS
1.2500 mg | ORAL_CAPSULE | Freq: Every day | ORAL | Status: DC
  Administered 2012-02-04: 1.25 mg via ORAL
  Filled 2012-02-04 (×2): qty 1

## 2012-02-04 MED ORDER — PREDNISOLONE ACETATE 1 % OP SUSP
1.0000 [drp] | Freq: Two times a day (BID) | OPHTHALMIC | Status: DC
  Filled 2012-02-04: qty 1

## 2012-02-04 MED ORDER — SODIUM CHLORIDE 0.9 % IV SOLN: 250.0000 mL | INTRAVENOUS | Status: AC

## 2012-02-04 MED ORDER — BRINZOLAMIDE 1 % OP SUSP
1.0000 [drp] | Freq: Three times a day (TID) | OPHTHALMIC | Status: DC
  Filled 2012-02-04 (×2): qty 10

## 2012-02-04 MED ORDER — CHOLECALCIFEROL 10 MCG (400 UNIT) PO TABS: 400.0000 [IU] | ORAL_TABLET | Freq: Every day | ORAL | Status: DC

## 2012-02-04 MED ORDER — BRIMONIDINE TARTRATE-TIMOLOL 0.2-0.5 % OP SOLN: 1.0000 [drp] | Freq: Two times a day (BID) | OPHTHALMIC | Status: DC

## 2012-02-04 MED ORDER — ENOXAPARIN SODIUM 40 MG/0.4ML ~~LOC~~ SOLN
40.0000 mg | SUBCUTANEOUS | Status: DC
  Administered 2012-02-04: 40 mg via SUBCUTANEOUS
  Filled 2012-02-04 (×2): qty 0.4

## 2012-02-04 MED ORDER — PREDNISOLONE ACETATE 1 % OP SUSP
1.0000 [drp] | Freq: Two times a day (BID) | OPHTHALMIC | Status: DC
  Administered 2012-02-04 – 2012-02-05 (×2): 1 [drp] via OPHTHALMIC
  Filled 2012-02-04 (×2): qty 5

## 2012-02-04 MED ORDER — SODIUM CHLORIDE 0.9 % IV SOLN: 250.0000 mL | INTRAVENOUS | Status: DC

## 2012-02-04 MED ORDER — METHOTREXATE 2.5 MG PO TABS: 10.0000 mg | ORAL_TABLET | ORAL | Status: DC

## 2012-02-04 MED ORDER — ALUM & MAG HYDROXIDE-SIMETH 200-200-20 MG/5ML PO SUSP
30.0000 mL | ORAL | Status: DC | PRN
  Administered 2012-02-04: 30 mL via ORAL
  Filled 2012-02-04: qty 30

## 2012-02-04 MED FILL — Dextrose Inj 5%: INTRAVENOUS | Qty: 50 | Status: AC

## 2012-02-04 NOTE — Progress Notes (Signed)
CRITICAL VALUE ALERT  Critical value received: CKMB =121.5; Troponin > 25  Date of notification:  5/57/2013  Time of notification:  0126  Critical value read back:yes  Nurse who received alert:  Ruffin Pyo  MD notified (1st page):  Expected resulted; pt with STEMI  Time of first page:    MD notified (2nd page):  Time of second page:  Responding MD:    Time MD responded:

## 2012-02-04 NOTE — Clinical Documentation Improvement (Signed)
CHF DOCUMENTATION CLARIFICATION QUERY  THIS DOCUMENT IS NOT A PERMANENT PART OF THE MEDICAL RECORD  TO RESPOND TO THE THIS QUERY, FOLLOW THE INSTRUCTIONS BELOW:  1. If needed, update documentation for the patient's encounter via the notes activity.  2. Access this query again and click edit on the In [employer].  3. After updating, or not, click F2 to complete all highlighted (required) fields concerning your review. Select "additional documentation in the medical record" OR "no additional documentation provided".  4. Click Sign note button.  5. The deficiency will fall out of your In Basket *Please let us know if you are not able to complete this workflow by phone or e-mail (listed below).  Please update your documentation within the medical record to reflect your response to this query.                                                                                    02/04/12  Dear Dr.[facility],  In a better effort to capture your patient's severity of illness, reflect appropriate length of stay and utilization of resources, a review of the patient medical record has revealed the following indicators the diagnosis of Heart Failure.  Based on your clinical judgment, please clarify and document in a progress note and/or discharge summary the clinical condition associated with the following supporting information: In responding to this query please exercise your independent judgment.  The fact that a query is asked, does not imply that any particular answer is desired or expected.  Possible Clinical Conditions?  Please specify systolic heart failure as acute or chronic: thanks.   Chronic Systolic Congestive Heart Failure  Acute Systolic Congestive Heart Failure  Acute on Chronic Systolic Congestive Heart Failure  Other Condition  Cannot Clinically Determine  Supporting Information: Risk Factors: chronic atrial fibrillation; s/p acute MI  Signs & Symptoms: Decreased  breath sound at bases with faint basilar Rales; Mild systolic heart failure; 2/6 systolic ejection murmur heard in the apical region  Diagnostics: Lab: BNP: 1499  EKG: afib  Radiology: Shallow inspiration. Cardiac enlargement. No pulmonary vascular congestion or edema.  Treatment: cath Meds: integrillin  Reviewed: additional documentation in the medical record  Thank You,  Amada Kingfisher  RN, BSN, CCM  Clinical Documentation Specialist: [email or web address] [phone]   Health Information Management [place]  I have put this diagnosis in my discharge notes    .

## 2012-02-04 NOTE — Progress Notes (Signed)
Patient a little confused this afternoon, stating she has been sitting in the sun all day and ready to go back in the house.  Patient easily reoriented.  Remembers me when I enter the room.  She remembers and can name all family members at bedside.  Grips equal, ambulating with very minimal assist to bathroom.  Transferred up from 2900 this afternoon before dinner.  Shades in current room all the way up and room is very bright.  Patient stated she got a little confused with the room changes.  Family states patient does get some confused at home when she awakes from sleep.  Dr. Jacinto Halim paged and notified.  No new orders received.  Will continue to monitor.  Colman Cater

## 2012-02-04 NOTE — Progress Notes (Signed)
Subjective:  Patient denies any chest pain or shortness of breath states feels better after PCI. States she used to take Coumadin many years ago for an irregular heart beat.  Objective:  Vital Signs in the last 24 hours: Temp:  [97.7 F (36.5 C)-98.5 F (36.9 C)] 97.7 F (36.5 C) (05/27 0400) Pulse Rate:  [56-80] 74  (05/27 0800) Resp:  [14-23] 18  (05/27 0800) BP: (95-157)/(11-102) 122/70 mmHg (05/27 0756) SpO2:  [96 %-100 %] 100 % (05/27 0800) Weight:  [58.968 kg (130 lb)-63.4 kg (139 lb 12.4 oz)] 63.4 kg (139 lb 12.4 oz) (05/26 2036)  Intake/Output from previous day: 05/26 0701 - 05/27 0700 In: 1003.7 [P.O.:420; I.V.:583.7] Out: 525 [Urine:525] Intake/Output from this shift: Total I/O In: 560 [P.O.:560] Out: -   Physical Exam: Neck: no adenopathy, no carotid bruit, no JVD and supple, symmetrical, trachea midline Lungs: Decreased breath sound at bases with faint basilar Rales Heart: irregularly irregular rhythm, S1, S2 normal and Soft systolic murmur and S3 gallop noted Abdomen: soft, non-tender; bowel sounds normal; no masses,  no organomegaly Extremities: extremities normal, atraumatic, no cyanosis or edema and Right groin stable with no hematoma or bruit  Lab Results:  Basename 02/04/12 0336 02/03/12 1751 02/03/12 1732  WBC 6.8 -- 6.9  HGB 10.7* 13.3 --  PLT 132* -- 158    Basename 02/04/12 0336 02/03/12 2344  NA 137 138  K 4.3 4.6  CL 108 107  CO2 20 19  GLUCOSE 147* 236*  BUN 15 16  CREATININE 0.64 0.65    Basename 02/04/12 0336 02/03/12 2343  TROPONINI >25.00* >25.00*   Hepatic Function Panel  Basename 02/03/12 2344  PROT 6.2  ALBUMIN 3.2*  AST 141*  ALT 24  ALKPHOS 75  BILITOT 0.2*  BILIDIR --  IBILI --    Basename 02/03/12 2352  CHOL 97   No results found for this basename: PROTIME in the last 72 hours  Imaging: Imaging results have been reviewed and Dg Chest Portable 1 View  02/03/2012  *RADIOLOGY REPORT*  Clinical Data: Code STEMI.   Post cath lab.  History of MI, CHF, diabetes.  PORTABLE CHEST - 1 VIEW  Comparison: 06/18/2011  Findings: Cardiac enlargement with normal pulmonary vascularity. Shallow inspiration.  No focal airspace consolidation or edema.  No blunting of costophrenic angles.  No pneumothorax.  Degenerative changes in the spine and shoulders.  Calcification of the aorta.  IMPRESSION: Shallow inspiration.  Cardiac enlargement.  No pulmonary vascular congestion or edema.  Original Report Authenticated By: Marlon Pel, M.D.    Cardiac Studies:  Assessment/Plan:  Status post acute anterolateral wall myocardial infarction status post PCI to LAD Mild systolic heart failure Hypertension Diabetes mellitus Chronic atrial fibrillation Hypothyroidism Rheumatoid arthritis  Plan Continue present management Add low-dose ACE inhibitors Check labs in a.m.   LOS: 1 day    [name] 02/04/2012, 10:29 AM

## 2012-02-05 DIAGNOSIS — I5021 Acute systolic (congestive) heart failure: Secondary | ICD-10-CM | POA: Diagnosis present

## 2012-02-05 LAB — CARDIAC PANEL(CRET KIN+CKTOT+MB+TROPI)
Relative Index: 5.4 — ABNORMAL HIGH (ref 0.0–2.5)
Total CK: 379 U/L — ABNORMAL HIGH (ref 7–177)

## 2012-02-05 LAB — CBC
Hemoglobin: 9.9 g/dL — ABNORMAL LOW (ref 12.0–15.0)
MCH: 31.2 pg (ref 26.0–34.0)
MCHC: 33.3 g/dL (ref 30.0–36.0)
MCV: 93.7 fL (ref 78.0–100.0)
RBC: 3.17 MIL/uL — ABNORMAL LOW (ref 3.87–5.11)

## 2012-02-05 LAB — BASIC METABOLIC PANEL
BUN: 15 mg/dL (ref 6–23)
CO2: 23 mEq/L (ref 19–32)
Calcium: 9.6 mg/dL (ref 8.4–10.5)
GFR calc non Af Amer: 60 mL/min — ABNORMAL LOW (ref >90)
Glucose, Bld: 113 mg/dL — ABNORMAL HIGH (ref 70–99)
Potassium: 4.2 mEq/L (ref 3.5–5.1)

## 2012-02-05 LAB — POCT ACTIVATED CLOTTING TIME: Activated Clotting Time: 435 seconds

## 2012-02-05 MED ORDER — CARVEDILOL 3.125 MG PO TABS: 3.1250 mg | ORAL_TABLET | Freq: Two times a day (BID) | ORAL | Status: DC

## 2012-02-05 MED ORDER — PRAVASTATIN SODIUM 20 MG PO TABS: 20.0000 mg | ORAL_TABLET | Freq: Every day | ORAL | Status: DC

## 2012-02-05 MED ORDER — ASPIRIN 81 MG PO CHEW: 81.0000 mg | CHEWABLE_TABLET | Freq: Every day | ORAL | Status: AC

## 2012-02-05 MED ORDER — NITROGLYCERIN 0.4 MG SL SUBL: 0.4000 mg | SUBLINGUAL_TABLET | SUBLINGUAL | Status: DC | PRN

## 2012-02-05 MED ORDER — CLOPIDOGREL BISULFATE 75 MG PO TABS: 75.0000 mg | ORAL_TABLET | Freq: Every day | ORAL | Status: AC

## 2012-02-05 MED ORDER — RAMIPRIL 1.25 MG PO CAPS: 1.2500 mg | ORAL_CAPSULE | Freq: Every day | ORAL | Status: DC

## 2012-02-05 NOTE — Discharge Summary (Signed)
Physician Discharge Summary  Patient ID: [name] MRN: [id number] DOB/AGE: 1921/03/17 78 y.o.  Admit date: 02/03/2012 Discharge date: 02/05/2012  Primary Discharge Diagnosis Acute anterolateral myocardial infarction that occurred on 02/03/2012. Successful PTCA and stenting of the mid LAD with a non-drug-eluting 3.5 x 24 mm Veriflex stent.  Secondary Discharge Diagnosis 2. Atrial fibrillation probably chronic as evidenced by markedly enlarged left atrium by echocardiogram done on 02/05/2012.3. Diabetes mellitus controlled 2. 4. Hypertension 5. Hyperlipidemia 6. Acute systolic heart failure with ejection fraction of 30% due to the acute myocardial infarction. Presently hemodynamically stable. Heart failure resolved with medical therapy.   Significant Diagnostic Studies: Left heart catheterization including hemodynamic monitoring of the left ventricle,LV gram, selective right and left coronary arteriography. PTCA and thrombectomy using xpress-way (unsuccessful) and Pronto catheters (successful), PTCA and stenting of the mid LAD with implantation of a non-drug-eluting 2.5 x 24 mm bare-metal Veriflex stent.  Hospital Course: Patient has been having chest pain since 2 PM or so and eventually came to the hospital around 5:30 in the evening. Patient was admitted to the hospital directly via activation of STEMI and was brought to the emergency department and then directly to the cardiac catheterization lab. She underwent very complex but successful angioplasty to the totally occluded mid LAD. Apical LAD remained occluded at the end of the procedure. Patient did well and is dilating in the hallway with of cardiac rehabilitation without any chest pain, shortness breath. She slept well last night without any PND or orthopnea. Hence she was felt stable for discharge with home health care and outpatient cardiac rehabilitation after a period or patient's family members were involved in the care of the  patient. Patient will need aspirin 81 mg by mouth daily and this will be permanent and at least one year of Plavix for acute myocardial infarction and a bare-metal stent in the mid LAD. From atrial fibrillation standpoint, patient is 78 years of age with anemia and will be on aspirin and Plavix for acute myocardial infarction. She'll be a high risk for bleed. I will consider starting her on Coumadin if her hemoglobin stabilizes. Patient was remotely on Coumadin but was stopped in the past. Keep her PCP informed regarding this.   Discharge Exam: Blood pressure 88/54, pulse 64, temperature 98.3 F (36.8 C), temperature source Oral, resp. rate 18, height 5\' 2"  (1.575 m), weight 63.4 kg (139 lb 12.4 oz), SpO2 92.00%.    General appearance: alert, cooperative, appears stated age and no distress Resp: clear to auscultation bilaterally Cardio: regular rate and rhythm, S1, S2 normal, no murmur, click, rub or gallop Extremities: extremities normal, atraumatic, no cyanosis or edema Pulses: 2+ and symmetric right groin site without hematoma. Labs:   Lab Results  Component Value Date   WBC 8.3 02/05/2012   HGB 9.9* 02/05/2012   HCT 29.7* 02/05/2012   MCV 93.7 02/05/2012   PLT 122* 02/05/2012    Lab 02/05/12 0508 02/03/12 2344  NA 141 --  K 4.2 --  CL 110 --  CO2 23 --  BUN 15 --  CREATININE 0.84 --  CALCIUM 9.6 --  PROT -- 6.2  BILITOT -- 0.2*  ALKPHOS -- 75  ALT -- 24  AST -- 141*  GLUCOSE 113* --   Lab Results  Component Value Date   CKTOTAL 379* 02/05/2012   CKMB 20.3* 02/05/2012   TROPONINI 22.57* 02/05/2012    Lipid Panel     Component Value Date/Time   CHOL 97 02/03/2012 2352  TRIG 80 02/03/2012 2352   HDL 42 02/03/2012 2352   CHOLHDL 2.3 02/03/2012 2352   VLDL 16 02/03/2012 2352   LDLCALC 39 02/03/2012 2352   HbA1c 6.7.  Radiology: Dg Chest Portable 1 View  02/03/2012  *RADIOLOGY REPORT*  Clinical Data: Code STEMI.  Post cath lab.  History of MI, CHF, diabetes.  PORTABLE  CHEST - 1 VIEW  Comparison: 06/18/2011  Findings: Cardiac enlargement with normal pulmonary vascularity. Shallow inspiration.  No focal airspace consolidation or edema.  No blunting of costophrenic angles.  No pneumothorax.  Degenerative changes in the spine and shoulders.  Calcification of the aorta.  IMPRESSION: Shallow inspiration.  Cardiac enlargement.  No pulmonary vascular congestion or edema.  Original Report Authenticated By: Marlon Pel, M.D.    EKG: On admission on 02/03/2012 revealed presence of atrial fibrillation with rapid response, hyperacute T waves in V2 to V6 with reciprocal ST depression in inferior leads suggestive of acute injury pattern in the anterolateral wall. 02/04/2012 EKG reveals atrial fibrillation with a ventricular rate of 61 beats a minute, Q waves noted in V1 to V6 with T wave inversion suggestive of completed anterior infarct.  Echocardiogram done on 01/28/2012 reveals ejection fraction of 30%, mid to distal anterior anteroapical dialysis. Mild mitral regurgitation. Left atrium is markedly dilated.  FOLLOW UP PLANS AND APPOINTMENTS  Medication List  As of 02/05/2012 10:59 AM   STOP taking these medications         potassium chloride 10 MEQ tablet         TAKE these medications         allopurinol 100 MG tablet   Commonly known as: ZYLOPRIM   Take 100 mg by mouth daily.      aspirin 81 MG chewable tablet   Chew 1 tablet (81 mg total) by mouth daily.      bimatoprost 0.03 % ophthalmic solution   Commonly known as: LUMIGAN   Place 1 drop into both eyes at bedtime.      brinzolamide 1 % ophthalmic suspension   Commonly known as: AZOPT   Place 1 drop into both eyes 3 (three) times daily.      carvedilol 3.125 MG tablet   Commonly known as: COREG   Take 1 tablet (3.125 mg total) by mouth 2 (two) times daily with a meal.      cholecalciferol 400 UNITS Tabs   Commonly known as: VITAMIN D   Take 400 Units by mouth daily.      clopidogrel 75 MG  tablet   Commonly known as: PLAVIX   Take 1 tablet (75 mg total) by mouth daily with breakfast.      colchicine 0.6 MG tablet   Take 0.6 mg by mouth 2 (two) times daily.      COMBIGAN 0.2-0.5 % ophthalmic solution   Generic drug: brimonidine-timolol   Place 1 drop into both eyes every 12 (twelve) hours.      glipiZIDE 5 MG 24 hr tablet   Commonly known as: GLUCOTROL XL   Take 5 mg by mouth daily.      levothyroxine 125 MCG tablet   Commonly known as: SYNTHROID, LEVOTHROID   Take 125 mcg by mouth daily.      metFORMIN 500 MG (MOD) 24 hr tablet   Commonly known as: GLUMETZA   Take 500-1,000 mg by mouth 2 (two) times daily with a meal. Takes 2 tablets (1000 mg) in the morning and 1 tablet (500 mg) in the evening  methotrexate 2.5 MG tablet   Commonly known as: RHEUMATREX   Take 10 mg by mouth once a week. Caution:Chemotherapy. Protect from light. Takes on saturdays      nitroGLYCERIN 0.4 MG SL tablet   Commonly known as: NITROSTAT   Place 1 tablet (0.4 mg total) under the tongue every 5 (five) minutes as needed for chest pain (CP or SOB).      pravastatin 20 MG tablet   Commonly known as: PRAVACHOL   Take 1 tablet (20 mg total) by mouth daily.      prednisoLONE acetate 1 % ophthalmic suspension   Commonly known as: PRED FORTE   Place 1 drop into the right eye 2 (two) times daily.      ramipril 1.25 MG capsule   Commonly known as: ALTACE   Take 1 capsule (1.25 mg total) by mouth daily.           Follow-up Information    Call Pamella Pert, MD.   Contact information:   [address]  [place] [address] [phone]       Call Milana Obey, MD.   Contact information:   [address] [place] [address] [phone]           Pamella Pert, MD 02/05/2012, 10:59 AM

## 2012-02-05 NOTE — Progress Notes (Signed)
  Echocardiogram 2D Echocardiogram has been performed.  Sabriyah Wilcher L 02/05/2012, 9:14 AM

## 2012-02-05 NOTE — Discharge Instructions (Addendum)
Home Health Services arranged with [facility]. [phone].  1. Registered Nurse.   Durable Medical Equipment:  Rollator Walker to be delivered to room before d/c.

## 2012-02-05 NOTE — Care Management Note (Signed)
    Page 1 of 1   02/05/2012     10:04:17 AM   CARE MANAGEMENT NOTE 02/05/2012  Patient:  [name], [name]   Account Number:  [id number]  Date Initiated:  02/05/2012  Documentation initiated by:  GRAVES-BIGELOW,Carolle Ishii  Subjective/Objective Assessment:   Pt admitted with Stemi. Pt is from home with grandson. Will d/c home with granddaughter. Orders for [facility] RN placed in system.     Action/Plan:   Pt's family chose [facility] for services. CM made referral for services.   Anticipated DC Date:  02/05/2012   Anticipated DC Plan:  HOME W HOME HEALTH SERVICES      DC Planning Services  CM consult      Choice offered to / List presented to:  C-4 Adult Children        HH arranged  HH-10 DISEASE MANAGEMENT  HH-1 RN      [facility] agency  [facility]   Status of service:  Completed, signed off Medicare Important Message given?   (If response is "NO", the following Medicare IM given date fields will be blank) Date Medicare IM given:   Date Additional Medicare IM given:    Discharge Disposition:  HOME W HOME HEALTH SERVICES  Per UR Regulation:  Reviewed for med. necessity/level of care/duration of stay  If discussed at Long Length of Stay Meetings, dates discussed:    Comments:  02-05-12 [address] [place], [place] [phone] CM made referral for services listed above. SOC to begin within 24- 48 hours post d/c.

## 2012-02-05 NOTE — Progress Notes (Addendum)
CARDIAC REHAB PHASE I   PRE:  Rate/Rhythm: 77 Afib    BP: sitting 40981    SaO2: 100 RA  MODE:  Ambulation: 170 ft   POST:  Rate/Rhythm: 108 Afib    BP: sitting 19147     SaO2: 95 RA  Tolerated fairly well with RW and gait belt, assist x1. Fairly steady. Sts she does not walk much at home. Uses cane and family assist when she goes out. Suggest rollator for more independent walking and duration (goal to walk to mail box or in grocery store). Seems to fatigue easily. Discussed MI, stent, low sodium, daily wts, and walking 3 times qd. One family member present, who did not seem attentive. Pt N/A for CRPII. [phone]  Harriet Masson CES, ACSM

## 2012-02-05 NOTE — Progress Notes (Signed)
UR Completed Lyndall Windt Graves-Bigelow, RN,BSN [phone]  

## 2013-03-15 ENCOUNTER — Emergency Department (HOSPITAL_COMMUNITY): Payer: Medicare Other

## 2013-03-15 ENCOUNTER — Encounter (HOSPITAL_COMMUNITY): Payer: Self-pay | Admitting: *Deleted

## 2013-03-15 ENCOUNTER — Observation Stay (HOSPITAL_COMMUNITY)
Admission: EM | Admit: 2013-03-15 | Discharge: 2013-03-15 | Disposition: A | Discharge status: Home / Self Care | Payer: Medicare Other | Attending: Family Medicine | Admitting: Family Medicine

## 2013-03-15 DIAGNOSIS — I5022 Chronic systolic (congestive) heart failure: Secondary | ICD-10-CM

## 2013-03-15 DIAGNOSIS — I4891 Unspecified atrial fibrillation: Secondary | ICD-10-CM

## 2013-03-15 DIAGNOSIS — R079 Chest pain, unspecified: Secondary | ICD-10-CM

## 2013-03-15 DIAGNOSIS — R002 Palpitations: Secondary | ICD-10-CM

## 2013-03-15 DIAGNOSIS — I2510 Atherosclerotic heart disease of native coronary artery without angina pectoris: Secondary | ICD-10-CM

## 2013-03-15 DIAGNOSIS — R001 Bradycardia, unspecified: Secondary | ICD-10-CM

## 2013-03-15 DIAGNOSIS — R0789 Other chest pain: Secondary | ICD-10-CM

## 2013-03-15 DIAGNOSIS — I509 Heart failure, unspecified: Secondary | ICD-10-CM | POA: Insufficient documentation

## 2013-03-15 DIAGNOSIS — I498 Other specified cardiac arrhythmias: Principal | ICD-10-CM | POA: Insufficient documentation

## 2013-03-15 DIAGNOSIS — Z7901 Long term (current) use of anticoagulants: Secondary | ICD-10-CM | POA: Insufficient documentation

## 2013-03-15 DIAGNOSIS — I2109 ST elevation (STEMI) myocardial infarction involving other coronary artery of anterior wall: Secondary | ICD-10-CM | POA: Diagnosis present

## 2013-03-15 DIAGNOSIS — Z955 Presence of coronary angioplasty implant and graft: Secondary | ICD-10-CM

## 2013-03-15 DIAGNOSIS — I369 Nonrheumatic tricuspid valve disorder, unspecified: Secondary | ICD-10-CM

## 2013-03-15 DIAGNOSIS — E119 Type 2 diabetes mellitus without complications: Secondary | ICD-10-CM

## 2013-03-15 DIAGNOSIS — Z79899 Other long term (current) drug therapy: Secondary | ICD-10-CM | POA: Insufficient documentation

## 2013-03-15 DIAGNOSIS — Z9861 Coronary angioplasty status: Secondary | ICD-10-CM

## 2013-03-15 LAB — BASIC METABOLIC PANEL
BUN: 14 mg/dL (ref 6–23)
CO2: 25 mEq/L (ref 19–32)
GFR calc non Af Amer: 74 mL/min — ABNORMAL LOW (ref >90)
Glucose, Bld: 159 mg/dL — ABNORMAL HIGH (ref 70–99)
Potassium: 3.9 mEq/L (ref 3.5–5.1)

## 2013-03-15 LAB — COMPREHENSIVE METABOLIC PANEL
ALT: 8 U/L (ref 0–35)
AST: 16 U/L (ref 0–37)
Albumin: 3.4 g/dL — ABNORMAL LOW (ref 3.5–5.2)
CO2: 25 mEq/L (ref 19–32)
Calcium: 9.7 mg/dL (ref 8.4–10.5)
GFR calc non Af Amer: 72 mL/min — ABNORMAL LOW (ref >90)
Sodium: 141 mEq/L (ref 135–145)

## 2013-03-15 LAB — CBC
HCT: 31 % — ABNORMAL LOW (ref 36.0–46.0)
Hemoglobin: 10.4 g/dL — ABNORMAL LOW (ref 12.0–15.0)
MCH: 32.5 pg (ref 26.0–34.0)
MCHC: 33.5 g/dL (ref 30.0–36.0)
RBC: 3.2 MIL/uL — ABNORMAL LOW (ref 3.87–5.11)

## 2013-03-15 LAB — GLUCOSE, CAPILLARY
Glucose-Capillary: 100 mg/dL — ABNORMAL HIGH (ref 70–99)
Glucose-Capillary: 147 mg/dL — ABNORMAL HIGH (ref 70–99)

## 2013-03-15 LAB — TROPONIN I
Troponin I: <0.3 ng/mL (ref <0.30)
Troponin I: <0.3 ng/mL (ref <0.30)
Troponin I: <0.3 ng/mL (ref <0.30)

## 2013-03-15 LAB — MAGNESIUM: Magnesium: 1.6 mg/dL (ref 1.5–2.5)

## 2013-03-15 LAB — HEMOGLOBIN A1C: Mean Plasma Glucose: 114 mg/dL (ref <117)

## 2013-03-15 MED ORDER — CARVEDILOL 3.125 MG PO TABS
3.1250 mg | ORAL_TABLET | Freq: Every day | ORAL | Status: DC
  Administered 2013-03-15: 3.125 mg via ORAL
  Filled 2013-03-15 (×2): qty 1

## 2013-03-15 MED ORDER — ISOSORBIDE MONONITRATE ER 30 MG PO TB24
30.0000 mg | ORAL_TABLET | Freq: Every day | ORAL | Status: DC
  Administered 2013-03-15: 30 mg via ORAL
  Filled 2013-03-15: qty 1

## 2013-03-15 MED ORDER — PREDNISOLONE ACETATE 1 % OP SUSP
1.0000 [drp] | Freq: Two times a day (BID) | OPHTHALMIC | Status: DC
  Administered 2013-03-15: 1 [drp] via OPHTHALMIC
  Filled 2013-03-15: qty 1

## 2013-03-15 MED ORDER — SODIUM CHLORIDE 0.9 % IJ SOLN: 3.0000 mL | INTRAMUSCULAR | Status: DC | PRN

## 2013-03-15 MED ORDER — ASPIRIN EC 81 MG PO TBEC
81.0000 mg | DELAYED_RELEASE_TABLET | Freq: Every day | ORAL | Status: DC
  Administered 2013-03-15: 81 mg via ORAL
  Filled 2013-03-15: qty 1

## 2013-03-15 MED ORDER — LEVOTHYROXINE SODIUM 125 MCG PO TABS
125.0000 ug | ORAL_TABLET | Freq: Every day | ORAL | Status: DC
  Administered 2013-03-15: 125 ug via ORAL
  Filled 2013-03-15 (×2): qty 1

## 2013-03-15 MED ORDER — BIMATOPROST 0.01 % OP SOLN
1.0000 [drp] | Freq: Every day | OPHTHALMIC | Status: DC
  Filled 2013-03-15: qty 2.5

## 2013-03-15 MED ORDER — GLIPIZIDE ER 5 MG PO TB24
5.0000 mg | ORAL_TABLET | Freq: Every day | ORAL | Status: DC
  Administered 2013-03-15: 5 mg via ORAL
  Filled 2013-03-15 (×2): qty 1

## 2013-03-15 MED ORDER — SODIUM CHLORIDE 0.9 % IV SOLN: 250.0000 mL | INTRAVENOUS | Status: DC | PRN

## 2013-03-15 MED ORDER — SODIUM CHLORIDE 0.9 % IJ SOLN: 3.0000 mL | Freq: Two times a day (BID) | INTRAMUSCULAR | Status: DC

## 2013-03-15 MED ORDER — SODIUM CHLORIDE 0.9 % IJ SOLN
3.0000 mL | Freq: Two times a day (BID) | INTRAMUSCULAR | Status: DC
  Administered 2013-03-15: 3 mL via INTRAVENOUS

## 2013-03-15 MED ORDER — APIXABAN 5 MG PO TABS
5.0000 mg | ORAL_TABLET | Freq: Every day | ORAL | Status: DC
  Administered 2013-03-15: 5 mg via ORAL
  Filled 2013-03-15: qty 1

## 2013-03-15 MED ORDER — RAMIPRIL 1.25 MG PO CAPS
1.2500 mg | ORAL_CAPSULE | Freq: Every day | ORAL | Status: DC
  Administered 2013-03-15: 1.25 mg via ORAL
  Filled 2013-03-15: qty 1

## 2013-03-15 NOTE — ED Provider Notes (Signed)
History    CSN: [id number] Arrival date & time 03/15/13  0140  First MD Initiated Contact with Patient 03/15/13 0216     Chief Complaint  Patient presents with  . Palpitations   (Consider location/radiation/quality/duration/timing/severity/associated sxs/prior Treatment) HPI Pt with history of atrial fibrillation brought to the ED via EMS from home where she was complaining of palpitations earlier this evening. She denies having had any chest pain to me, but per EMS she was complaining of right sided chest pain and back pain earlier today. Family at bedside states she took one NTG at home and is now asymptomatic. She had reported HR variable from 30-160 while enroute. No SOB, fever, diaphoresis today.   Past Medical History  Diagnosis Date  . Diabetes mellitus   . Glaucoma   . A-fib   . Myocardial infarction     3.5x14mm Veriflex stent 02/03/2012  . Coronary artery disease   . Hypothyroidism   . CHF (congestive heart failure)   . Arthritis     RA   Past Surgical History  Procedure Laterality Date  . Eye surgery    . Cardiac catheterization  02/03/12  . Tonsillectomy     No family history on file. History  Substance Use Topics  . Smoking status: Never Smoker   . Smokeless tobacco: Not on file  . Alcohol Use: No   OB History   Grav Para Term Preterm Abortions TAB SAB Ect Mult Living                 Review of Systems All other systems reviewed and are negative except as noted in HPI.   Allergies  Review of patient's allergies indicates no known allergies.  Home Medications   Current Outpatient Rx  Name  Route  Sig  Dispense  Refill  . allopurinol (ZYLOPRIM) 100 MG tablet   Oral   Take 100 mg by mouth daily.         . bimatoprost (LUMIGAN) 0.03 % ophthalmic solution   Both Eyes   Place 1 drop into both eyes at bedtime.         . brimonidine-timolol (COMBIGAN) 0.2-0.5 % ophthalmic solution   Both Eyes   Place 1 drop into both eyes every 12 (twelve)  hours.         . brinzolamide (AZOPT) 1 % ophthalmic suspension   Both Eyes   Place 1 drop into both eyes 3 (three) times daily.         Marland Kitchen EXPIRED: carvedilol (COREG) 3.125 MG tablet   Oral   Take 1 tablet (3.125 mg total) by mouth 2 (two) times daily with a meal.   60 tablet   6   . cholecalciferol (VITAMIN D) 400 UNITS TABS   Oral   Take 400 Units by mouth daily.         . colchicine 0.6 MG tablet   Oral   Take 0.6 mg by mouth 2 (two) times daily.         Marland Kitchen glipiZIDE (GLUCOTROL XL) 5 MG 24 hr tablet   Oral   Take 5 mg by mouth daily.         Marland Kitchen levothyroxine (SYNTHROID, LEVOTHROID) 125 MCG tablet   Oral   Take 125 mcg by mouth daily.         . metFORMIN (GLUMETZA) 500 MG (MOD) 24 hr tablet   Oral   Take 500-1,000 mg by mouth 2 (two) times daily with a meal. Takes 2  tablets (1000 mg) in the morning and 1 tablet (500 mg) in the evening         . methotrexate (RHEUMATREX) 2.5 MG tablet   Oral   Take 10 mg by mouth once a week. Caution:Chemotherapy. Protect from light. Takes on saturdays         . EXPIRED: nitroGLYCERIN (NITROSTAT) 0.4 MG SL tablet   Sublingual   Place 1 tablet (0.4 mg total) under the tongue every 5 (five) minutes as needed for chest pain (CP or SOB).   30 tablet   3   . EXPIRED: pravastatin (PRAVACHOL) 20 MG tablet   Oral   Take 1 tablet (20 mg total) by mouth daily.   30 tablet   6   . prednisoLONE acetate (PRED FORTE) 1 % ophthalmic suspension   Right Eye   Place 1 drop into the right eye 2 (two) times daily.         Marland Kitchen EXPIRED: ramipril (ALTACE) 1.25 MG capsule   Oral   Take 1 capsule (1.25 mg total) by mouth daily.   30 capsule   6    BP 140/62  Pulse 53  Temp(Src) 98.3 F (36.8 C) (Oral)  Resp 16  SpO2 100% Physical Exam  Nursing note and vitals reviewed. Constitutional: She is oriented to person, place, and time. She appears well-developed and well-nourished.  HENT:  Head: Normocephalic and atraumatic.  Eyes:  EOM are normal. Pupils are equal, round, and reactive to light.  Neck: Normal range of motion. Neck supple.  Cardiovascular: Normal rate, normal heart sounds and intact distal pulses.  An irregularly irregular rhythm present.  Pulmonary/Chest: Effort normal and breath sounds normal.  Abdominal: Bowel sounds are normal. She exhibits no distension. There is no tenderness.  Musculoskeletal: Normal range of motion. She exhibits no edema and no tenderness.  Neurological: She is alert and oriented to person, place, and time. She has normal strength. No cranial nerve deficit or sensory deficit.  Skin: Skin is warm and dry. No rash noted.  Psychiatric: She has a normal mood and affect.    ED Course  Procedures (including critical care time) Labs Reviewed  CBC - Abnormal; Notable for the following:    RBC 3.20 (*)    Hemoglobin 10.4 (*)    HCT 31.0 (*)    RDW 17.2 (*)    Platelets 103 (*)    All other components within normal limits  BASIC METABOLIC PANEL - Abnormal; Notable for the following:    Glucose, Bld 159 (*)    GFR calc non Af Amer 74 (*)    GFR calc Af Amer 85 (*)    All other components within normal limits  PROTIME-INR - Abnormal; Notable for the following:    Prothrombin Time 15.8 (*)    All other components within normal limits  TROPONIN I   Dg Chest 2 View  03/15/2013   *RADIOLOGY REPORT*  Clinical Data: Right-sided chest and back discomfort. Palpitations.  CHEST - 2 VIEW  Comparison: 02/05/2012  Findings: Slightly shallow inspiration.  Diffuse cardiac enlargement with normal pulmonary vascularity similar to previous study.  No focal consolidation or airspace disease in the lungs. No blunting of costophrenic angles.  No pneumothorax.  Mild emphysematous changes.  Calcification of the aorta.  Degenerative changes in the spine and shoulders.  No significant change since previous study.  IMPRESSION: Diffuse cardiac enlargement.  No pulmonary vascular congestion or edema.  No active  lung disease.   Original Report Authenticated  By: Burman Nieves, M.D.   1. Chest pain   2. Palpitations     MDM   Date: 03/15/2013  Rate: 69  Rhythm: atrial fibrillation  QRS Axis: left  Intervals: normal  ST/T Wave abnormalities: normal  Conduction Disutrbances:none  Narrative Interpretation: low voltages  Old EKG Reviewed: unchanged  POC Trop did not cross over but was 0.02. Will order a formal lab Trop. Pt remains pain free in the ED with no significant or sustained tachycardia. Admit for rule out.    Charles B. Bernette Mayers, MD 03/15/13 4098

## 2013-03-15 NOTE — Progress Notes (Signed)
PHARMACIST - PHYSICIAN COMMUNICATION DR:    CONCERNING:  Eliquis   DESCRIPTION: 79yo female on Eliquis 5mg  daily at home, reordered as inpatient.  Proper dosing for this pt is 2.5mg  BID given age and weight <60kg.  Total dose appropriate but should be given divided BID.  Please consider changing.

## 2013-03-15 NOTE — Discharge Summary (Signed)
Physician Discharge Summary  [name] AVW:[id number] DOB: 09/19/1920 DOA: 03/15/2013  PCP: Milana Obey, MD  Admit date: 03/15/2013 Discharge date: 03/15/2013  Time spent: 30 minutes minutes  Recommendations for Outpatient Follow-up:  1.Acute MI; will obtain serial cardiac enzymes, patient scheduled for 2-D echo, if any studies are positive we will consult cardiology. 2. Chest pain; never present 3. A. Fib; Pt is rate controlled and Eliquis for anticoagulation  4. DM; well controlled with hemoglobin A1c= 5.6 5.CHF: Stable/mildly improved cardiac function. Patient to be discharged and follow up with her cardiologist discharge on home medication regimen   Discharge Diagnoses:  Principal Problem:   Chest pressure Active Problems:   Acute MI anterior wall first episode care 05/14   CAD (coronary artery disease), native coronary artery   Type II or unspecified type diabetes mellitus without mention of complication, not stated as uncontrolled   Atrial fibrillation   Palpitations   Sinus bradycardia   Chronic systolic CHF (congestive heart failure) EF30%   Stented coronary artery 05/14 by dr Jacinto Halim   Discharge Condition: Stable   Diet recommendation: Heart healthy  Filed Weights   03/15/13 0617  Weight: 58.605 kg (129 lb 3.2 oz)    History of present illness:  78 yo female with stemi s/p stent 5/14, chf with ef 30% , dm, woke up suddenly tonight with her heart racing fast her grandson stays with her called ems. She reported chest pressure then, but denies this now. ems reported her hr range from 30 to 130. She has been more normal here with rate above 50, and denies chest pressure since arrival. She did get ntg with ems. Denies recent fevers/n/v/d. No le edema or swelling. No sob. Feels back to normal now. TODAY cardiac enzymes x3 negative patient states that she never had chest pain or tightness. Patient reports that she had a fast heartbeat and therefore took some  nitroglycerin which significantly slow down her heart rate which is why patient was transported to ED negative chest pain, shortness of breath, negative tachycardia, negative pedal edema   Hospital Course:  Patient admitted on 03/15/2013 for tachycardia/bradycardia secondary to NTG. Patient had cardiac enzymes x3 which are negative, chest x-ray which showed no significant change from previous, and a 2-D cardiac echo which continues to show patient has congestive heart failure. Although it does show some mild improvement in LVEF= 40-50% as compared to cardiac echo from 02/05/2012 which showed LVEF= 30-45%  Procedures: CXR 03/15/2013 showed Diffuse cardiac enlargement. No pulmonary vascular congestion or  edema. No active lung disease  2D Cardiac Echo - Left ventricle: Septal hypokinesis The cavity size was normal. Wall thickness was normal. Systolic function was mildly reduced. The estimated ejection fraction was in the range of 45% to 50%. - Aortic valve: Mild regurgitation. - Mitral valve: Calcified annulus. Moderate regurgitation. - Left atrium: The atrium was severely dilated. - Right ventricle: The cavity size was mildly dilated. - Right atrium: The atrium was moderately to severely dilated. - Atrial septum: No defect or patent foramen ovale was identified. - Tricuspid valve: Severe regurgitation. - Pericardium, extracardiac: A trivial pericardial effusion was identified posterior to the heart.     EKG 03/15/2013;  clear to EKG dated 02/04/2012 patient continues to be in A. fib, leftward axis, patient has changes in her leads 1 aVL V4 through V6. Leads 1 and aVL T-wave flat/upright T wave in V4 through V6 is now up right. Consider lateral ischemia vs infarct.     Consultations:  Discharge Exam: Filed Vitals:   03/15/13 0154 03/15/13 0257 03/15/13 0316 03/15/13 0617  BP: 140/62 142/70 139/86 162/73  Pulse: 53  72 67  Temp: 98.3 F (36.8 C) 97.5 F (36.4 C) 97.8 F (36.6 C)  98.1 F (36.7 C)  TempSrc: Oral Oral Oral Oral  Resp: 16 20 24 18   Height:    5\' 2"  (1.575 m)  Weight:    58.605 kg (129 lb 3.2 oz)  SpO2: 100% 100% 98% 97%    General: AlertNAD Cardiovascular: Regular rhythm and rate, negative murmurs rubs gallops, DP/PT pulse one plus Respiratory: Clear to auscultation bilateral   Discharge Instructions     Medication List    ASK your doctor about these medications       allopurinol 100 MG tablet  Commonly known as:  ZYLOPRIM  Take 100 mg by mouth daily.     bimatoprost 0.03 % ophthalmic solution  Commonly known as:  LUMIGAN  Place 1 drop into both eyes at bedtime.     brinzolamide 1 % ophthalmic suspension  Commonly known as:  AZOPT  Place 1 drop into both eyes 3 (three) times daily.     carvedilol 3.125 MG tablet  Commonly known as:  COREG  Take 3.125 mg by mouth daily with breakfast.     cholecalciferol 400 UNITS Tabs  Commonly known as:  VITAMIN D  Take 400 Units by mouth daily.     COMBIGAN 0.2-0.5 % ophthalmic solution  Generic drug:  brimonidine-timolol  Place 1 drop into both eyes every 12 (twelve) hours.     ELIQUIS 5 MG Tabs tablet  Generic drug:  apixaban  Take 5 mg by mouth daily.     glipiZIDE 5 MG 24 hr tablet  Commonly known as:  GLUCOTROL XL  Take 5 mg by mouth daily.     isosorbide mononitrate 30 MG 24 hr tablet  Commonly known as:  IMDUR  Take 30 mg by mouth daily.     levothyroxine 125 MCG tablet  Commonly known as:  SYNTHROID, LEVOTHROID  Take 125 mcg by mouth daily.     metFORMIN 500 MG (MOD) 24 hr tablet  Commonly known as:  GLUMETZA  Take 500-1,000 mg by mouth 2 (two) times daily with a meal. Takes 2 tablets (1000 mg) in the morning and 1 tablet (500 mg) in the evening     methotrexate 2.5 MG tablet  Commonly known as:  RHEUMATREX  - Take 10 mg by mouth once a week. Caution:Chemotherapy. Protect from light.  - On Saturdays     nitroGLYCERIN 0.4 MG SL tablet  Commonly known as:   NITROSTAT  Place 0.4 mg under the tongue every 5 (five) minutes as needed for chest pain.     pravastatin 20 MG tablet  Commonly known as:  PRAVACHOL  Take 20 mg by mouth every evening.     prednisoLONE acetate 1 % ophthalmic suspension  Commonly known as:  PRED FORTE  Place 1 drop into the right eye 2 (two) times daily.     ramipril 1.25 MG capsule  Commonly known as:  ALTACE  Take 1.25 mg by mouth daily.       No Known Allergies    The results of significant diagnostics from this hospitalization (including imaging, microbiology, ancillary and laboratory) are listed below for reference.    Significant Diagnostic Studies: Dg Chest 2 View  03/15/2013   *RADIOLOGY REPORT*  Clinical Data: Right-sided chest and back discomfort. Palpitations.  CHEST -  2 VIEW  Comparison: 02/05/2012  Findings: Slightly shallow inspiration.  Diffuse cardiac enlargement with normal pulmonary vascularity similar to previous study.  No focal consolidation or airspace disease in the lungs. No blunting of costophrenic angles.  No pneumothorax.  Mild emphysematous changes.  Calcification of the aorta.  Degenerative changes in the spine and shoulders.  No significant change since previous study.  IMPRESSION: Diffuse cardiac enlargement.  No pulmonary vascular congestion or edema.  No active lung disease.   Original Report Authenticated By: Burman Nieves, M.D.    Microbiology: No results found for this or any previous visit (from the past 240 hour(s)).   Labs: Basic Metabolic Panel:  Recent Labs Lab 03/15/13 0235  NA 138  K 3.9  CL 107  CO2 25  GLUCOSE 159*  BUN 14  CREATININE 0.71  CALCIUM 9.6   Liver Function Tests: No results found for this basename: AST, ALT, ALKPHOS, BILITOT, PROT, ALBUMIN,  in the last 168 hours No results found for this basename: LIPASE, AMYLASE,  in the last 168 hours No results found for this basename: AMMONIA,  in the last 168 hours CBC:  Recent Labs Lab  03/15/13 0235  WBC 6.5  HGB 10.4*  HCT 31.0*  MCV 96.9  PLT 103*   Cardiac Enzymes:  Recent Labs Lab 03/15/13 0445  TROPONINI <0.30   BNP: BNP (last 3 results) No results found for this basename: PROBNP,  in the last 8760 hours CBG:  Recent Labs Lab 03/15/13 0615  GLUCAP 100*       Signed:  WOODS, CURTIS, J  [facility] 03/15/2013, 7:36 AM

## 2013-03-15 NOTE — Progress Notes (Signed)
  Echocardiogram 2D Echocardiogram has been performed.  Georgian Co 03/15/2013, 11:50 AM

## 2013-03-15 NOTE — ED Notes (Signed)
EMS-pt reports she started having right sided chest and back discomfort this evening and then started having palpitations. Pt took 1 nitro at home. Pt with hx of afib. HR 30-160 en route. 22g(L)hand. Pt denies any pain at this time. Denies sob.

## 2013-03-15 NOTE — H&P (Signed)
PCP:   Milana Obey, MD   Chief Complaint:  Heart racing  HPI: 79 yo female with stemi s/p stent 5/14, chf with ef 30% , dm, woke up suddenly tonight with her heart racing fast her grandson stays with her called ems.  She reported chest pressure then, but denies this now.  ems reported her hr range from 30 to 130.  She has been more normal here with rate above 50, and denies chest pressure since arrival.  She did get ntg with ems.  Denies recent fevers/n/v/d.  No le edema or swelling.  No sob.  Feels back to normal now.  Review of Systems:  Positive and negative as per HPI otherwise all other systems are negative  Past Medical History: Past Medical History  Diagnosis Date  . Diabetes mellitus   . Glaucoma   . A-fib   . Myocardial infarction     3.5x63mm Veriflex stent 02/03/2012  . Coronary artery disease   . Hypothyroidism   . CHF (congestive heart failure)   . Arthritis     RA   Past Surgical History  Procedure Laterality Date  . Eye surgery    . Cardiac catheterization  02/03/12  . Tonsillectomy      Medications: Prior to Admission medications   Medication Sig Start Date End Date Taking? Authorizing Provider  allopurinol (ZYLOPRIM) 100 MG tablet Take 100 mg by mouth daily.   Yes Historical Provider, MD  apixaban (ELIQUIS) 5 MG TABS tablet Take 5 mg by mouth daily.   Yes Historical Provider, MD  bimatoprost (LUMIGAN) 0.03 % ophthalmic solution Place 1 drop into both eyes at bedtime.   Yes Historical Provider, MD  brimonidine-timolol (COMBIGAN) 0.2-0.5 % ophthalmic solution Place 1 drop into both eyes every 12 (twelve) hours.   Yes Historical Provider, MD  brinzolamide (AZOPT) 1 % ophthalmic suspension Place 1 drop into both eyes 3 (three) times daily.   Yes Historical Provider, MD  carvedilol (COREG) 3.125 MG tablet Take 3.125 mg by mouth daily with breakfast.   Yes Historical Provider, MD  cholecalciferol (VITAMIN D) 400 UNITS TABS Take 400 Units by mouth daily.    Yes Historical Provider, MD  glipiZIDE (GLUCOTROL XL) 5 MG 24 hr tablet Take 5 mg by mouth daily.   Yes Historical Provider, MD  isosorbide mononitrate (IMDUR) 30 MG 24 hr tablet Take 30 mg by mouth daily.   Yes Historical Provider, MD  levothyroxine (SYNTHROID, LEVOTHROID) 125 MCG tablet Take 125 mcg by mouth daily.   Yes Historical Provider, MD  metFORMIN (GLUMETZA) 500 MG (MOD) 24 hr tablet Take 500-1,000 mg by mouth 2 (two) times daily with a meal. Takes 2 tablets (1000 mg) in the morning and 1 tablet (500 mg) in the evening   Yes Historical Provider, MD  methotrexate (RHEUMATREX) 2.5 MG tablet Take 10 mg by mouth once a week. Caution:Chemotherapy. Protect from light. On Saturdays   Yes Historical Provider, MD  nitroGLYCERIN (NITROSTAT) 0.4 MG SL tablet Place 0.4 mg under the tongue every 5 (five) minutes as needed for chest pain.   Yes Historical Provider, MD  pravastatin (PRAVACHOL) 20 MG tablet Take 20 mg by mouth every evening.   Yes Historical Provider, MD  prednisoLONE acetate (PRED FORTE) 1 % ophthalmic suspension Place 1 drop into the right eye 2 (two) times daily.   Yes Historical Provider, MD  ramipril (ALTACE) 1.25 MG capsule Take 1.25 mg by mouth daily.   Yes Historical Provider, MD    Allergies:  No Known Allergies  Social History:  reports that she has never smoked. She does not have any smokeless tobacco history on file. She reports that she does not drink alcohol.  Family History: Denies  Physical Exam: Filed Vitals:   03/15/13 0154 03/15/13 0257 03/15/13 0316  BP: 140/62 142/70 139/86  Pulse: 53  72  Temp: 98.3 F (36.8 C) 97.5 F (36.4 C) 97.8 F (36.6 C)  TempSrc: Oral Oral Oral  Resp: 16 20 24   SpO2: 100% 100% 98%   General appearance: alert, cooperative and no distress Head: Normocephalic, without obvious abnormality, atraumatic Eyes: negative Nose: Nares normal. Septum midline. Mucosa normal. No drainage or sinus tenderness. Neck: no JVD and supple,  symmetrical, trachea midline Lungs: clear to auscultation bilaterally Heart: regular rate and rhythm, S1, S2 normal, no murmur, click, rub or gallop Abdomen: soft, non-tender; bowel sounds normal; no masses,  no organomegaly Extremities: extremities normal, atraumatic, no cyanosis or edema Pulses: 2+ and symmetric Skin: Skin color, texture, turgor normal. No rashes or lesions Neurologic: Grossly normal  Labs on Admission:   Recent Labs  03/15/13 0235  NA 138  K 3.9  CL 107  CO2 25  GLUCOSE 159*  BUN 14  CREATININE 0.71  CALCIUM 9.6    Recent Labs  03/15/13 0235  WBC 6.5  HGB 10.4*  HCT 31.0*  MCV 96.9  PLT 103*   Radiological Exams on Admission: Dg Chest 2 View  03/15/2013   *RADIOLOGY REPORT*  Clinical Data: Right-sided chest and back discomfort. Palpitations.  CHEST - 2 VIEW  Comparison: 02/05/2012  Findings: Slightly shallow inspiration.  Diffuse cardiac enlargement with normal pulmonary vascularity similar to previous study.  No focal consolidation or airspace disease in the lungs. No blunting of costophrenic angles.  No pneumothorax.  Mild emphysematous changes.  Calcification of the aorta.  Degenerative changes in the spine and shoulders.  No significant change since previous study.  IMPRESSION: Diffuse cardiac enlargement.  No pulmonary vascular congestion or edema.  No active lung disease.   Original Report Authenticated By: Burman Nieves, M.D.    Assessment/Plan  79 yo female with sinus bradycardia also probably tachy earlier with questionable chest pressure earlier also with recent stemi/stent Principal Problem:   Chest pressure Active Problems:   Acute MI anterior wall first episode care 05/14   CAD (coronary artery disease), native coronary artery   Type II or unspecified type diabetes mellitus without mention of complication, not stated as uncontrolled   Atrial fibrillation   Palpitations   Sinus bradycardia   Chronic systolic CHF (congestive heart  failure) EF30%   Stented coronary artery 05/14 by dr Jacinto Halim  chf compensated.  Tele.  Romi.  Echo in am.  Monitor closely.  Cardiologist is dr Jacinto Halim.  Full code.  Adalina Dopson A 03/15/2013, 4:55 AM

## 2013-03-15 NOTE — ED Notes (Signed)
Patient transported to X-ray 

## 2013-03-16 NOTE — Progress Notes (Signed)
Utilization review completed.  

## 2013-06-30 ENCOUNTER — Other Ambulatory Visit: Payer: Self-pay | Admitting: Family Medicine

## 2013-06-30 DIAGNOSIS — Z1231 Encounter for screening mammogram for malignant neoplasm of breast: Secondary | ICD-10-CM

## 2013-07-24 ENCOUNTER — Ambulatory Visit: Payer: Medicare Other

## 2013-09-10 DIAGNOSIS — I639 Cerebral infarction, unspecified: Secondary | ICD-10-CM

## 2013-09-10 HISTORY — DX: Cerebral infarction, unspecified: I63.9

## 2013-09-15 ENCOUNTER — Inpatient Hospital Stay (HOSPITAL_COMMUNITY)
Admission: EM | Admit: 2013-09-15 | Discharge: 2013-09-21 | DRG: 061 | Disposition: A | Discharge status: Home Health | Payer: Medicare Other | Attending: Neurology | Admitting: Neurology

## 2013-09-15 ENCOUNTER — Emergency Department (HOSPITAL_COMMUNITY): Payer: Medicare Other

## 2013-09-15 ENCOUNTER — Encounter (HOSPITAL_COMMUNITY): Payer: Self-pay | Admitting: Radiology

## 2013-09-15 DIAGNOSIS — Z7901 Long term (current) use of anticoagulants: Secondary | ICD-10-CM

## 2013-09-15 DIAGNOSIS — I4891 Unspecified atrial fibrillation: Secondary | ICD-10-CM | POA: Diagnosis present

## 2013-09-15 DIAGNOSIS — R471 Dysarthria and anarthria: Secondary | ICD-10-CM | POA: Diagnosis present

## 2013-09-15 DIAGNOSIS — E039 Hypothyroidism, unspecified: Secondary | ICD-10-CM | POA: Diagnosis present

## 2013-09-15 DIAGNOSIS — I6350 Cerebral infarction due to unspecified occlusion or stenosis of unspecified cerebral artery: Principal | ICD-10-CM | POA: Diagnosis present

## 2013-09-15 DIAGNOSIS — G8190 Hemiplegia, unspecified affecting unspecified side: Secondary | ICD-10-CM | POA: Diagnosis present

## 2013-09-15 DIAGNOSIS — I252 Old myocardial infarction: Secondary | ICD-10-CM

## 2013-09-15 DIAGNOSIS — I2510 Atherosclerotic heart disease of native coronary artery without angina pectoris: Secondary | ICD-10-CM | POA: Diagnosis present

## 2013-09-15 DIAGNOSIS — R29810 Facial weakness: Secondary | ICD-10-CM | POA: Diagnosis present

## 2013-09-15 DIAGNOSIS — R4701 Aphasia: Secondary | ICD-10-CM | POA: Diagnosis present

## 2013-09-15 DIAGNOSIS — G936 Cerebral edema: Secondary | ICD-10-CM | POA: Diagnosis present

## 2013-09-15 DIAGNOSIS — E119 Type 2 diabetes mellitus without complications: Secondary | ICD-10-CM | POA: Diagnosis present

## 2013-09-15 DIAGNOSIS — I509 Heart failure, unspecified: Secondary | ICD-10-CM | POA: Diagnosis present

## 2013-09-15 DIAGNOSIS — Z9861 Coronary angioplasty status: Secondary | ICD-10-CM

## 2013-09-15 DIAGNOSIS — I639 Cerebral infarction, unspecified: Secondary | ICD-10-CM

## 2013-09-15 DIAGNOSIS — F0390 Unspecified dementia without behavioral disturbance: Secondary | ICD-10-CM | POA: Diagnosis present

## 2013-09-15 DIAGNOSIS — M069 Rheumatoid arthritis, unspecified: Secondary | ICD-10-CM | POA: Diagnosis present

## 2013-09-15 DIAGNOSIS — Z79899 Other long term (current) drug therapy: Secondary | ICD-10-CM

## 2013-09-15 LAB — CBC
HCT: 33.9 % — ABNORMAL LOW (ref 36.0–46.0)
HEMOGLOBIN: 11.3 g/dL — AB (ref 12.0–15.0)
MCH: 32.4 pg (ref 26.0–34.0)
MCHC: 33.3 g/dL (ref 30.0–36.0)
MCV: 97.1 fL (ref 78.0–100.0)
Platelets: 111 10*3/uL — ABNORMAL LOW (ref 150–400)
RBC: 3.49 MIL/uL — AB (ref 3.87–5.11)
RDW: 16.8 % — ABNORMAL HIGH (ref 11.5–15.5)
WBC: 5.7 10*3/uL (ref 4.0–10.5)

## 2013-09-15 LAB — POCT I-STAT, CHEM 8
BUN: 16 mg/dL (ref 6–23)
CALCIUM ION: 1.27 mmol/L (ref 1.13–1.30)
Chloride: 109 mEq/L (ref 96–112)
Creatinine, Ser: 0.8 mg/dL (ref 0.50–1.10)
Glucose, Bld: 158 mg/dL — ABNORMAL HIGH (ref 70–99)
HEMATOCRIT: 38 % (ref 36.0–46.0)
HEMOGLOBIN: 12.9 g/dL (ref 12.0–15.0)
Potassium: 4.2 mEq/L (ref 3.7–5.3)
SODIUM: 143 meq/L (ref 137–147)
TCO2: 21 mmol/L (ref 0–100)

## 2013-09-15 LAB — RAPID URINE DRUG SCREEN, HOSP PERFORMED
Amphetamines: NOT DETECTED
BARBITURATES: NOT DETECTED
Benzodiazepines: NOT DETECTED
COCAINE: NOT DETECTED
Opiates: NOT DETECTED
Tetrahydrocannabinol: NOT DETECTED

## 2013-09-15 LAB — DIFFERENTIAL
Basophils Absolute: 0 10*3/uL (ref 0.0–0.1)
Basophils Relative: 0 % (ref 0–1)
EOS PCT: 1 % (ref 0–5)
Eosinophils Absolute: 0 10*3/uL (ref 0.0–0.7)
LYMPHS ABS: 1.6 10*3/uL (ref 0.7–4.0)
Lymphocytes Relative: 28 % (ref 12–46)
Monocytes Absolute: 0.4 10*3/uL (ref 0.1–1.0)
Monocytes Relative: 7 % (ref 3–12)
Neutro Abs: 3.7 10*3/uL (ref 1.7–7.7)
Neutrophils Relative %: 64 % (ref 43–77)

## 2013-09-15 LAB — URINE MICROSCOPIC-ADD ON

## 2013-09-15 LAB — COMPREHENSIVE METABOLIC PANEL
ALT: 8 U/L (ref 0–35)
AST: 14 U/L (ref 0–37)
Albumin: 3.7 g/dL (ref 3.5–5.2)
Alkaline Phosphatase: 99 U/L (ref 39–117)
BILIRUBIN TOTAL: 0.4 mg/dL (ref 0.3–1.2)
BUN: 16 mg/dL (ref 6–23)
CALCIUM: 9.8 mg/dL (ref 8.4–10.5)
CO2: 23 meq/L (ref 19–32)
CREATININE: 0.79 mg/dL (ref 0.50–1.10)
Chloride: 108 mEq/L (ref 96–112)
GFR calc Af Amer: 82 mL/min — ABNORMAL LOW (ref >90)
GFR, EST NON AFRICAN AMERICAN: 71 mL/min — AB (ref >90)
GLUCOSE: 157 mg/dL — AB (ref 70–99)
Potassium: 4.4 mEq/L (ref 3.7–5.3)
Sodium: 145 mEq/L (ref 137–147)
Total Protein: 6.8 g/dL (ref 6.0–8.3)

## 2013-09-15 LAB — URINALYSIS, ROUTINE W REFLEX MICROSCOPIC
Bilirubin Urine: NEGATIVE
Glucose, UA: NEGATIVE mg/dL
Ketones, ur: 15 mg/dL — AB
Leukocytes, UA: NEGATIVE
NITRITE: NEGATIVE
PH: 6 (ref 5.0–8.0)
Protein, ur: NEGATIVE mg/dL
SPECIFIC GRAVITY, URINE: 1.019 (ref 1.005–1.030)
Urobilinogen, UA: 1 mg/dL (ref 0.0–1.0)

## 2013-09-15 LAB — TROPONIN I: Troponin I: <0.3 ng/mL (ref <0.30)

## 2013-09-15 LAB — MRSA PCR SCREENING: MRSA by PCR: NEGATIVE

## 2013-09-15 LAB — GLUCOSE, CAPILLARY
GLUCOSE-CAPILLARY: 152 mg/dL — AB (ref 70–99)
GLUCOSE-CAPILLARY: 129 mg/dL — AB (ref 70–99)

## 2013-09-15 LAB — PROTIME-INR
INR: 1.19 (ref 0.00–1.49)
Prothrombin Time: 14.8 seconds (ref 11.6–15.2)

## 2013-09-15 LAB — POCT I-STAT TROPONIN I: TROPONIN I, POC: 0.01 ng/mL (ref 0.00–0.08)

## 2013-09-15 LAB — ETHANOL: Alcohol, Ethyl (B): <11 mg/dL (ref 0–11)

## 2013-09-15 LAB — APTT: aPTT: 29 seconds (ref 24–37)

## 2013-09-15 MED ORDER — SENNOSIDES-DOCUSATE SODIUM 8.6-50 MG PO TABS
1.0000 | ORAL_TABLET | Freq: Every evening | ORAL | Status: DC | PRN
  Filled 2013-09-15: qty 1

## 2013-09-15 MED ORDER — TIMOLOL MALEATE 0.5 % OP SOLN
1.0000 [drp] | Freq: Two times a day (BID) | OPHTHALMIC | Status: DC
  Administered 2013-09-15 – 2013-09-21 (×12): 1 [drp] via OPHTHALMIC
  Filled 2013-09-15: qty 5

## 2013-09-15 MED ORDER — CHOLECALCIFEROL 10 MCG (400 UNIT) PO TABS
400.0000 [IU] | ORAL_TABLET | Freq: Every day | ORAL | Status: DC
  Administered 2013-09-17 – 2013-09-21 (×5): 400 [IU] via ORAL
  Filled 2013-09-15 (×9): qty 1

## 2013-09-15 MED ORDER — METFORMIN HCL ER 500 MG PO TB24
1000.0000 mg | ORAL_TABLET | Freq: Two times a day (BID) | ORAL | Status: DC
  Administered 2013-09-17: 1000 mg via ORAL
  Filled 2013-09-15 (×5): qty 2

## 2013-09-15 MED ORDER — MIDAZOLAM HCL 2 MG/2ML IJ SOLN
INTRAMUSCULAR | Status: AC | PRN
Start: 2013-09-15 — End: 2013-09-15
  Administered 2013-09-15 (×2): 1 mg via INTRAVENOUS

## 2013-09-15 MED ORDER — ACETAMINOPHEN 650 MG RE SUPP: 650.0000 mg | RECTAL | Status: DC | PRN

## 2013-09-15 MED ORDER — FENTANYL CITRATE 0.05 MG/ML IJ SOLN
INTRAMUSCULAR | Status: AC | PRN
  Administered 2013-09-15 (×2): 25 ug via INTRAVENOUS

## 2013-09-15 MED ORDER — MIDAZOLAM HCL 2 MG/2ML IJ SOLN
INTRAMUSCULAR | Status: AC
  Filled 2013-09-15: qty 2

## 2013-09-15 MED ORDER — ALTEPLASE (STROKE) FULL DOSE INFUSION
0.9000 mg/kg | Freq: Once | INTRAVENOUS | Status: AC
  Administered 2013-09-15: 53 mg via INTRAVENOUS
  Filled 2013-09-15: qty 53

## 2013-09-15 MED ORDER — SIMVASTATIN 10 MG PO TABS
10.0000 mg | ORAL_TABLET | Freq: Every day | ORAL | Status: DC
  Administered 2013-09-16 – 2013-09-21 (×5): 10 mg via ORAL
  Filled 2013-09-15 (×7): qty 1

## 2013-09-15 MED ORDER — METFORMIN HCL ER 500 MG PO TB24
500.0000 mg | ORAL_TABLET | Freq: Every day | ORAL | Status: DC
  Administered 2013-09-16: 500 mg via ORAL
  Filled 2013-09-15 (×2): qty 1

## 2013-09-15 MED ORDER — BRIMONIDINE TARTRATE-TIMOLOL 0.2-0.5 % OP SOLN: 1.0000 [drp] | Freq: Two times a day (BID) | OPHTHALMIC | Status: DC

## 2013-09-15 MED ORDER — METFORMIN HCL ER 500 MG PO TB24
500.0000 mg | ORAL_TABLET | Freq: Every day | ORAL | Status: DC
  Administered 2013-09-17 – 2013-09-21 (×4): 500 mg via ORAL
  Filled 2013-09-15 (×6): qty 1

## 2013-09-15 MED ORDER — CARVEDILOL 3.125 MG PO TABS
3.1250 mg | ORAL_TABLET | Freq: Every day | ORAL | Status: DC
  Administered 2013-09-17 – 2013-09-21 (×5): 3.125 mg via ORAL
  Filled 2013-09-15 (×7): qty 1

## 2013-09-15 MED ORDER — LABETALOL HCL 5 MG/ML IV SOLN: 10.0000 mg | INTRAVENOUS | Status: DC | PRN

## 2013-09-15 MED ORDER — ALLOPURINOL 100 MG PO TABS
100.0000 mg | ORAL_TABLET | Freq: Every day | ORAL | Status: DC
  Administered 2013-09-17 – 2013-09-21 (×5): 100 mg via ORAL
  Filled 2013-09-15 (×8): qty 1

## 2013-09-15 MED ORDER — LEVOTHYROXINE SODIUM 125 MCG PO TABS
125.0000 ug | ORAL_TABLET | Freq: Every day | ORAL | Status: DC
  Administered 2013-09-17 – 2013-09-21 (×5): 125 ug via ORAL
  Filled 2013-09-15 (×8): qty 1

## 2013-09-15 MED ORDER — SODIUM CHLORIDE 0.9 % IV SOLN
INTRAVENOUS | Status: DC
  Administered 2013-09-15: 22:00:00 via INTRAVENOUS

## 2013-09-15 MED ORDER — METFORMIN HCL ER 500 MG PO TB24
500.0000 mg | ORAL_TABLET | Freq: Two times a day (BID) | ORAL | Status: DC
Start: 2013-09-15 — End: 2013-09-15

## 2013-09-15 MED ORDER — NITROGLYCERIN 0.4 MG SL SUBL: 0.4000 mg | SUBLINGUAL_TABLET | SUBLINGUAL | Status: DC | PRN

## 2013-09-15 MED ORDER — ISOSORBIDE MONONITRATE ER 30 MG PO TB24
30.0000 mg | ORAL_TABLET | Freq: Every day | ORAL | Status: DC
  Administered 2013-09-18 – 2013-09-21 (×4): 30 mg via ORAL
  Filled 2013-09-15 (×7): qty 1

## 2013-09-15 MED ORDER — SODIUM CHLORIDE 0.9 % IV SOLN
INTRAVENOUS | Status: DC
  Administered 2013-09-17: 12:00:00 via INTRAVENOUS
  Administered 2013-09-18: 75 mL/h via INTRAVENOUS
  Administered 2013-09-19: 22:00:00 via INTRAVENOUS

## 2013-09-15 MED ORDER — ACETAMINOPHEN 325 MG PO TABS
650.0000 mg | ORAL_TABLET | ORAL | Status: DC | PRN
  Administered 2013-09-17 – 2013-09-21 (×4): 650 mg via ORAL
  Filled 2013-09-15 (×4): qty 2

## 2013-09-15 MED ORDER — PREDNISOLONE ACETATE 1 % OP SUSP
1.0000 [drp] | Freq: Two times a day (BID) | OPHTHALMIC | Status: DC
  Administered 2013-09-15 – 2013-09-21 (×11): 1 [drp] via OPHTHALMIC
  Filled 2013-09-15 (×2): qty 1

## 2013-09-15 MED ORDER — IOHEXOL 300 MG/ML  SOLN
150.0000 mL | Freq: Once | INTRAMUSCULAR | Status: AC | PRN
  Administered 2013-09-15: 60 mL via INTRA_ARTERIAL

## 2013-09-15 MED ORDER — LATANOPROST 0.005 % OP SOLN
1.0000 [drp] | Freq: Every day | OPHTHALMIC | Status: DC
  Administered 2013-09-15 – 2013-09-20 (×6): 1 [drp] via OPHTHALMIC
  Filled 2013-09-15: qty 2.5

## 2013-09-15 MED ORDER — GLIPIZIDE ER 5 MG PO TB24
5.0000 mg | ORAL_TABLET | Freq: Every day | ORAL | Status: DC
  Administered 2013-09-18 – 2013-09-21 (×3): 5 mg via ORAL
  Filled 2013-09-15 (×7): qty 1

## 2013-09-15 MED ORDER — PANTOPRAZOLE SODIUM 40 MG IV SOLR
40.0000 mg | Freq: Every day | INTRAVENOUS | Status: DC
  Administered 2013-09-15 – 2013-09-17 (×3): 40 mg via INTRAVENOUS
  Filled 2013-09-15 (×5): qty 40

## 2013-09-15 MED ORDER — METHOTREXATE 2.5 MG PO TABS
10.0000 mg | ORAL_TABLET | ORAL | Status: DC
  Administered 2013-09-19: 10 mg via ORAL
  Filled 2013-09-15: qty 4

## 2013-09-15 MED ORDER — BRINZOLAMIDE 1 % OP SUSP
1.0000 [drp] | Freq: Three times a day (TID) | OPHTHALMIC | Status: DC
  Administered 2013-09-15 – 2013-09-21 (×16): 1 [drp] via OPHTHALMIC
  Filled 2013-09-15 (×2): qty 10

## 2013-09-15 MED ORDER — BRIMONIDINE TARTRATE 0.2 % OP SOLN
1.0000 [drp] | Freq: Two times a day (BID) | OPHTHALMIC | Status: DC
  Administered 2013-09-15 – 2013-09-21 (×9): 1 [drp] via OPHTHALMIC
  Filled 2013-09-15 (×2): qty 5

## 2013-09-15 MED ORDER — RAMIPRIL 1.25 MG PO CAPS
1.2500 mg | ORAL_CAPSULE | Freq: Every day | ORAL | Status: DC
  Administered 2013-09-17 – 2013-09-21 (×5): 1.25 mg via ORAL
  Filled 2013-09-15 (×7): qty 1

## 2013-09-15 MED ORDER — NITROGLYCERIN 1 MG/10 ML FOR IR/CATH LAB
INTRA_ARTERIAL | Status: AC
  Filled 2013-09-15: qty 10

## 2013-09-15 MED ORDER — FENTANYL CITRATE 0.05 MG/ML IJ SOLN
INTRAMUSCULAR | Status: AC
  Filled 2013-09-15: qty 2

## 2013-09-15 NOTE — Code Documentation (Signed)
80yo female arriving to [facility] via [facility] at 1600.  Patient was LKW at 1500 per family.  She developed sudden onset left sided weakness, left facial droop, right gaze and altered mental status per EMS at 1515.  Patient taken to CT on arrival.  NIHSS 19 on arrival, see documentation for details.  Code stroke times documented in doc flowsheets.  Dr. Leroy Kennedy at bedside and discussed care with family.  Family confirms that patient has not been taking her Eliquis recently because she ran out of the medication.  Decision made to treat with tPA per Dr. Leroy Kennedy.  tPA started at 1649.  Patient transported to IR at 1700.  Bedside handoff completed with IR RN Peggy.  Report given to [place], RN on [place].

## 2013-09-15 NOTE — ED Provider Notes (Signed)
CSN: [id number]     Arrival date & time 09/15/13  1600 History   First MD Initiated Contact with Patient 09/15/13 [phone]     Chief Complaint  Patient presents with  . Code Stroke   Patient is a 80 y.o. female presenting with Acute Neurological Problem.  Cerebrovascular Accident This is a new problem. The current episode started today. The problem occurs constantly. The problem has been unchanged. Associated symptoms include weakness. Nothing aggravates the symptoms. She has tried nothing for the symptoms. The treatment provided no relief.    80 y/o with history of DM, MI, A-fib, CHF and CAD who presents via EMS for stroke like symptoms. Last seen normal at 2:00 PM. Was found by son with left sided weakness and right gaze preference.    Past Medical History  Diagnosis Date  . Diabetes mellitus   . Glaucoma   . A-fib   . Myocardial infarction     3.5x77mm Veriflex stent 02/03/2012  . Coronary artery disease   . Hypothyroidism   . CHF (congestive heart failure)   . Arthritis     RA   Past Surgical History  Procedure Laterality Date  . Eye surgery    . Cardiac catheterization  02/03/12  . Tonsillectomy     History reviewed. No pertinent family history. History  Substance Use Topics  . Smoking status: Never Smoker   . Smokeless tobacco: Not on file  . Alcohol Use: No   OB History   Grav Para Term Preterm Abortions TAB SAB Ect Mult Living                 Review of Systems  Unable to perform ROS: Acuity of condition  Neurological: Positive for weakness.    Allergies  Review of patient's allergies indicates no known allergies.  Home Medications   No current outpatient prescriptions on file. BP 151/62  Pulse 66  Temp(Src) 98 F (36.7 C) (Oral)  Resp 21  Ht 5\' 4"  (1.626 m)  Wt 125 lb 10.6 oz (57 kg)  BMI 21.56 kg/m2  SpO2 100% Physical Exam  Nursing note and vitals reviewed. Constitutional: She is oriented to person, place, and time. She appears well-developed and  well-nourished. No distress.  HENT:  Head: Normocephalic and atraumatic.  Eyes: Conjunctivae are normal. Pupils are equal, round, and reactive to light.  Neck: Normal range of motion. Neck supple.  Cardiovascular: Normal rate and regular rhythm.  Exam reveals no gallop and no friction rub.   No murmur heard. Pulmonary/Chest: Effort normal and breath sounds normal.  Abdominal: Soft. She exhibits no distension. There is no tenderness.  Musculoskeletal: Normal range of motion. She exhibits no edema and no tenderness.  Neurological: She is alert and oriented to person, place, and time. She has normal reflexes. A cranial nerve deficit (decreased strength and sensation along left face) and sensory deficit (decreased on left) is present. GCS eye subscore is 4. GCS verbal subscore is 5. GCS motor subscore is 6.  Left hemiparesis. Left facial droop. Rightward gaze preference.   Skin: Skin is warm and dry.  Psychiatric: She has a normal mood and affect.    ED Course  Procedures (including critical care time) Labs Review Labs Reviewed  CBC - Abnormal; Notable for the following:    RBC 3.49 (*)    Hemoglobin 11.3 (*)    HCT 33.9 (*)    RDW 16.8 (*)    Platelets 111 (*)    All other components within normal  limits  COMPREHENSIVE METABOLIC PANEL - Abnormal; Notable for the following:    Glucose, Bld 157 (*)    GFR calc non Af Amer 71 (*)    GFR calc Af Amer 82 (*)    All other components within normal limits  URINALYSIS, ROUTINE W REFLEX MICROSCOPIC - Abnormal; Notable for the following:    Hgb urine dipstick SMALL (*)    Ketones, ur 15 (*)    All other components within normal limits  GLUCOSE, CAPILLARY - Abnormal; Notable for the following:    Glucose-Capillary 152 (*)    All other components within normal limits  URINE MICROSCOPIC-ADD ON - Abnormal; Notable for the following:    Casts HYALINE CASTS (*)    All other components within normal limits  HEMOGLOBIN A1C - Abnormal; Notable for  the following:    Hemoglobin A1C 6.5 (*)    Mean Plasma Glucose 140 (*)    All other components within normal limits  LIPID PANEL - Abnormal; Notable for the following:    HDL 38 (*)    All other components within normal limits  GLUCOSE, CAPILLARY - Abnormal; Notable for the following:    Glucose-Capillary 129 (*)    All other components within normal limits  PROTIME-INR - Abnormal; Notable for the following:    Prothrombin Time 15.8 (*)    All other components within normal limits  POCT I-STAT, CHEM 8 - Abnormal; Notable for the following:    Glucose, Bld 158 (*)    All other components within normal limits  MRSA PCR SCREENING  ETHANOL  PROTIME-INR  APTT  DIFFERENTIAL  TROPONIN I  URINE RAPID DRUG SCREEN (HOSP PERFORMED)  APTT  GLUCOSE, CAPILLARY  GLUCOSE, CAPILLARY  POCT I-STAT TROPONIN I   Imaging Review Ct Head Wo Contrast  09/15/2013   CLINICAL DATA:  Slurred speech.  EXAM: CT HEAD WITHOUT CONTRAST  TECHNIQUE: Contiguous axial images were obtained from the base of the skull through the vertex without intravenous contrast.  COMPARISON:  None.  FINDINGS: No intracranial hemorrhage.  Slightly hyperdense right carotid terminus and M1 segment of the right middle cerebral artery. Thrombus not excluded in proper clinical setting.  Right occipital white matter type changes spare the overlying gray matter as may be expected with encephalomalacia from prior infarct. Vasogenic edema therefore not entirely excluded (although no surrounding mass effect upon the lateral ventricle). Contrast-enhanced examination could be performed to exclude underlying enhancing lesion.  Mild atrophy. Ventricular prominence felt to be related to atrophy rather than hydrocephalus.  The dens projects superiorly which may reflect cranial settling with mild encroachment on the cervical medullary junction.  IMPRESSION: No intracranial hemorrhage.  Slightly hyperdense right carotid terminus and M1 segment of the right  middle cerebral artery. Thrombus not excluded in proper clinical setting.  Right occipital white matter type changes may represent result of prior infarct although slightly atypical as noted above.  Mild atrophy.  The dens projects superiorly which may reflect cranial settling with mild encroachment on the cervical medullary junction.  These results were called by telephone at the time of interpretation on 09/15/2013 at 4:18 PM to Dr. Cyril Mourning, who verbally acknowledged these results.   Electronically Signed   By: Bridgett Larsson M.D.   On: 09/15/2013 16:27   Dg Chest Port 1 View  09/16/2013   CLINICAL DATA:  History of CVA, post angiogram images.  EXAM: PORTABLE CHEST - 1 VIEW  COMPARISON:  None.  FINDINGS: The cardiac silhouette is enlarged. The pulmonary  vascularity is prominent centrally. There is no pleural effusion. There is no alveolar infiltrate. The observed portions of the bony structures appear normal.  IMPRESSION: There is mild enlargement of cardiac silhouette and prominence of the central pulmonary vascularity which may reflect a low-grade CHF. There is no evidence of atelectasis or nor pneumonia.   Electronically Signed   By: David  [place]   On: 09/16/2013 12:29   Ir Angio Intra Extracran Sel Com Carotid Innominate Uni R Mod Sed  09/16/2013   CLINICAL DATA:  Acute onset of left-sided paralysis, right gaze deviation. Hyperdense right MCA sign.  EXAM: IR ANGIO INTRA EXTRACRAN SEL COM CAROTID INNOMINATE UNI RIGHT MOD SED AND RIGHT VERTEBRAL ARTERY ANGIOGRAM:  MEDICATIONS: Versed 2 mg IV. Fentanyl 50 mcg IV.  ANESTHESIA/SEDATION: Conscious sedation.  CONTRAST:  35 OMNIPAQUE IOHEXOL 300 MG/ML  SOLN  PROCEDURE: Following a full explanation of the procedure along with the potential associated complications, an informed witnessed consent was obtained.  The right groin was prepped and draped in the usual sterile fashion. Thereafter using modified Seldinger technique, transfemoral access into the right common  femoral artery was obtained without difficulty. Over a 0.035-inch guidewire, a 5 [place] Pinnacle sheath was inserted. Through this and also over a 0.035-inch guidewire, a 5 [place] JB1 catheter was advanced to the aortic arch region and selectively positioned into the right common carotid artery and innominate artery.  There were no acute complications. The patient tolerated the procedure well.  COMPLICATIONS: None immediate.  FINDINGS: The right common carotid arteriogram demonstrates the right external carotid artery and its major branches to be grossly normal.  The right internal carotid artery at the bulb to the cranial skull base is normal.  The petrous, the cavernous and the supraclinoid segments are widely patent.  Normal opacification is seen of the right anterior cerebral artery into the capillary and venous phases.  There is complete angiographic occlusion of the right middle cerebral artery in its proximal one-third.  Transient opacification of the right posterior communicating artery is seen.  The right vertebral artery origin is normal. The vessel opacifies normally to the cranial skull base. There is normal opacification of the right vertebrobasilar junction and the right posterior inferior cerebellar artery.  The opacified portions of the basilar artery, the posterior cerebral arteries, the superior cerebellar arteries and the anterior inferior cerebellar arteries is normal into the delayed arterial phase.  IMPRESSION: Angiographically occluded right middle cerebral artery in its proximal one-third.   Electronically Signed   By: Julieanne Cotton M.D.   On: 09/16/2013 08:41    EKG Interpretation    Date/Time:  Tuesday September 15 2013 16:22:53 EST Ventricular Rate:  61 PR Interval:    QRS Duration: 84 QT Interval:  452 QTC Calculation: 455 R Axis:   14 Text Interpretation:  Atrial fibrillation Probable anterior infarct, age indeterminate Confirmed by Bebe Shaggy  MD, DONALD [phone]) on 09/15/2013  4:26:16 PM            MDM   1. CVA (cerebral infarction)     Code stroke called on arrival. ABC's intact. CT revealed right MCA occlusion. Pt in window. Neurology recommended tPA. After informed consent tPA was given and the patinet was admitted to the neuro ICU in HDS condition.     Shanon Ace, MD 09/16/13 1343

## 2013-09-15 NOTE — Consult Note (Signed)
Referring Physician: Bebe ShaggyWickline    Chief Complaint: Code stroke  HPI:                                                                                                                                         [name] is an 80 y.o. female who has known Afib and on Eliquis.  Talking to her granddaughter she has not refilled her Elequis and she is sure she has not taken this medication since 09/03/13.  Patient was her normal self this AM per son and was walking around.  At 15:00 hours he noted she called his name and he noted she was not moving her right side and unable to answer his questions appropriately. On exam she was showing left arm flaccidity , left leg hemiparesis, left facial droop and right gaze preference.   Date last known well: Date: 09/15/2012 Time last known well: Time: 15:00 NIHSS: 19 tPA Given: Yes  Past Medical History  Diagnosis Date  . Diabetes mellitus   . Glaucoma   . A-fib   . Myocardial infarction     3.5x6224mm Veriflex stent 02/03/2012  . Coronary artery disease   . Hypothyroidism   . CHF (congestive heart failure)   . Arthritis     RA    Past Surgical History  Procedure Laterality Date  . Eye surgery    . Cardiac catheterization  02/03/12  . Tonsillectomy      No family history on file. Social History:  reports that she has never smoked. She does not have any smokeless tobacco history on file. She reports that she does not drink alcohol. Her drug history is not on file.  Allergies: No Known Allergies  Medications:                                                                                                                           Current Facility-Administered Medications  Medication Dose Route Frequency Provider Last Rate Last Dose  . alteplase (ACTIVASE) 1 mg/mL infusion 53 mg  0.9 mg/kg Intravenous Once Joya Gaskinsonald W Wickline, MD       Current Outpatient Prescriptions  Medication Sig Dispense Refill  . allopurinol (ZYLOPRIM) 100 MG tablet Take  100 mg by mouth daily.      Marland Kitchen. apixaban (ELIQUIS) 5 MG TABS tablet Take 5 mg by mouth daily.      .Marland Kitchen  bimatoprost (LUMIGAN) 0.03 % ophthalmic solution Place 1 drop into both eyes at bedtime.      . brimonidine-timolol (COMBIGAN) 0.2-0.5 % ophthalmic solution Place 1 drop into both eyes every 12 (twelve) hours.      . brinzolamide (AZOPT) 1 % ophthalmic suspension Place 1 drop into both eyes 3 (three) times daily.      . carvedilol (COREG) 3.125 MG tablet Take 3.125 mg by mouth daily with breakfast.      . cholecalciferol (VITAMIN D) 400 UNITS TABS Take 400 Units by mouth daily.      Marland Kitchen glipiZIDE (GLUCOTROL XL) 5 MG 24 hr tablet Take 5 mg by mouth daily.      . isosorbide mononitrate (IMDUR) 30 MG 24 hr tablet Take 30 mg by mouth daily.      Marland Kitchen levothyroxine (SYNTHROID, LEVOTHROID) 125 MCG tablet Take 125 mcg by mouth daily.      . metFORMIN (GLUMETZA) 500 MG (MOD) 24 hr tablet Take 500-1,000 mg by mouth 2 (two) times daily with a meal. Takes 2 tablets (1000 mg) in the morning and 1 tablet (500 mg) in the evening      . methotrexate (RHEUMATREX) 2.5 MG tablet Take 10 mg by mouth once a week. Caution:Chemotherapy. Protect from light. On Saturdays      . nitroGLYCERIN (NITROSTAT) 0.4 MG SL tablet Place 0.4 mg under the tongue every 5 (five) minutes as needed for chest pain.      . pravastatin (PRAVACHOL) 20 MG tablet Take 20 mg by mouth every evening.      . prednisoLONE acetate (PRED FORTE) 1 % ophthalmic suspension Place 1 drop into the right eye 2 (two) times daily.      . ramipril (ALTACE) 1.25 MG capsule Take 1.25 mg by mouth daily.         ROS:  Unable to obtain due to mental status                                                                                                                                     History obtained from the patient and and son   Physical exam: pleasant female in no apparent distress.  Head: normocephalic. Neck: supple, no bruits, no JVD. Cardiac: no  murmurs. Lungs: clear. Abdomen: soft, no tender, no mass. Extremities: no edema.  Neurologic Examination:  Blood pressure 161/134, pulse 70, temperature 98.1 F (36.7 C), temperature source Oral, resp. rate 18, SpO2 100.00%. Mental Status: Alert, oriented, thought content appropriate. Dysarthric.  Able to follow 3 step commands without difficulty. Cranial Nerves: II: Discs flat bilaterally; Visual fields grossly normal, pupils equal, round, reactive to light and accommodation III,IV, VI: ptosis not present, right gaze preference V,VII: smile asymmetric with left face weakness, facial light touch diminished in the left. VIII: hearing normal bilaterally IX,X: gag reflex present XI: bilateral shoulder shrug no tested XII: midline tongue extension without atrophy or fasciculations  Motor: Left HP Tone and bulk:normal tone throughout; no atrophy noted Sensory: Pinprick and light touch diminished in the left Deep Tendon Reflexes:  Right: Upper Extremity   Left: Upper extremity   biceps (C-5 to C-6) 2/4   biceps (C-5 to C-6) 2/4 tricep (C7) 2/4    triceps (C7) 2/4 Brachioradialis (C6) 2/4  Brachioradialis (C6) 2/4  Lower Extremity Lower Extremity  quadriceps (L-2 to L-4) 2/4   quadriceps (L-2 to L-4) 2/4 Achilles (S1) 2/4   Achilles (S1) 2/4  Plantars: Right: downgoing   Left: upgoing Cerebellar: No tested Gait: No tested CV: pulses palpable throughout      Results for orders placed during the hospital encounter of 09/15/13 (from the past 48 hour(s))  PROTIME-INR     Status: None   Collection Time    09/15/13  4:06 PM      Result Value Range   Prothrombin Time 14.8  11.6 - 15.2 seconds   INR 1.19  0.00 - 1.49  APTT     Status: None   Collection Time    09/15/13  4:06 PM      Result Value Range   aPTT 29  24 - 37 seconds  CBC     Status: Abnormal (Preliminary result)    Collection Time    09/15/13  4:06 PM      Result Value Range   WBC 5.7  4.0 - 10.5 K/uL   RBC 3.49 (*) 3.87 - 5.11 MIL/uL   Hemoglobin 11.3 (*) 12.0 - 15.0 g/dL   HCT 16.1 (*) 09.6 - 04.5 %   MCV 97.1  78.0 - 100.0 fL   MCH 32.4  26.0 - 34.0 pg   MCHC 33.3  30.0 - 36.0 g/dL   RDW 40.9 (*) 81.1 - 91.4 %   Platelets PENDING  150 - 400 K/uL  DIFFERENTIAL     Status: None   Collection Time    09/15/13  4:06 PM      Result Value Range   Neutrophils Relative % 64  43 - 77 %   Neutro Abs 3.7  1.7 - 7.7 K/uL   Lymphocytes Relative 28  12 - 46 %   Lymphs Abs 1.6  0.7 - 4.0 K/uL   Monocytes Relative 7  3 - 12 %   Monocytes Absolute 0.4  0.1 - 1.0 K/uL   Eosinophils Relative 1  0 - 5 %   Eosinophils Absolute 0.0  0.0 - 0.7 K/uL   Basophils Relative 0  0 - 1 %   Basophils Absolute 0.0  0.0 - 0.1 K/uL  POCT I-STAT TROPONIN I     Status: None   Collection Time    09/15/13  4:10 PM      Result Value Range   Troponin i, poc 0.01  0.00 - 0.08 ng/mL   Comment 3  Comment: Due to the release kinetics of cTnI,     a negative result within the first hours     of the onset of symptoms does not rule out     myocardial infarction with certainty.     If myocardial infarction is still suspected,     repeat the test at appropriate intervals.  POCT I-STAT, CHEM 8     Status: Abnormal   Collection Time    09/15/13  4:12 PM      Result Value Range   Sodium 143  137 - 147 mEq/L   Potassium 4.2  3.7 - 5.3 mEq/L   Chloride 109  96 - 112 mEq/L   BUN 16  6 - 23 mg/dL   Creatinine, Ser 4.01  0.50 - 1.10 mg/dL   Glucose, Bld 027 (*) 70 - 99 mg/dL   Calcium, Ion 2.53  6.64 - 1.30 mmol/L   TCO2 21  0 - 100 mmol/L   Hemoglobin 12.9  12.0 - 15.0 g/dL   HCT 40.3  47.4 - 25.9 %  GLUCOSE, CAPILLARY     Status: Abnormal   Collection Time    09/15/13  4:29 PM      Result Value Range   Glucose-Capillary 152 (*) 70 - 99 mg/dL   Ct Head Wo Contrast  09/15/2013   CLINICAL DATA:  Slurred speech.   EXAM: CT HEAD WITHOUT CONTRAST  TECHNIQUE: Contiguous axial images were obtained from the base of the skull through the vertex without intravenous contrast.  COMPARISON:  None.  FINDINGS: No intracranial hemorrhage.  Slightly hyperdense right carotid terminus and M1 segment of the right middle cerebral artery. Thrombus not excluded in proper clinical setting.  Right occipital white matter type changes spare the overlying gray matter as may be expected with encephalomalacia from prior infarct. Vasogenic edema therefore not entirely excluded (although no surrounding mass effect upon the lateral ventricle). Contrast-enhanced examination could be performed to exclude underlying enhancing lesion.  Mild atrophy. Ventricular prominence felt to be related to atrophy rather than hydrocephalus.  The dens projects superiorly which may reflect cranial settling with mild encroachment on the cervical medullary junction.  IMPRESSION: No intracranial hemorrhage.  Slightly hyperdense right carotid terminus and M1 segment of the right middle cerebral artery. Thrombus not excluded in proper clinical setting.  Right occipital white matter type changes may represent result of prior infarct although slightly atypical as noted above.  Mild atrophy.  The dens projects superiorly which may reflect cranial settling with mild encroachment on the cervical medullary junction.  These results were called by telephone at the time of interpretation on 09/15/2013 at 4:18 PM to Dr. Cyril Mourning, who verbally acknowledged these results.   Electronically Signed   By: Bridgett Larsson M.D.   On: 09/15/2013 16:27     Assessment: 80 y.o. female with acute onset left HP, left face weakness, and dysarthria. NIHSS 19. CT brain with no acute ischemia but mildly hyperdense right MCA. Her neurological syndrome is consistent with a right MCA distribution infarct. She meets criteria for thrombolysis ( as per family she has been off apixaban since 08/2513). Plan is to  go ahead with IV tpa and after discussion with interventional radiology we decided to take her to the angio suite to determine whether or not further intervention is prudent. Discussed at length with family and they agreed to pursue that pathway.   Stroke Risk Factors - age, atrial fibrillation and diabetes mellitus, CAD  Wyatt Portela, MD [facility]  [phone]  09/15/2013, 4:37 PM

## 2013-09-15 NOTE — Procedures (Signed)
S/P RT common carotid arteriogram. RT CFa approach. Findings. RT MCA occlusion . Extensive tortuosity of aortic arch.

## 2013-09-15 NOTE — ED Notes (Signed)
MD d/w family POC await decision

## 2013-09-15 NOTE — ED Notes (Signed)
Transferred pt to IR.

## 2013-09-15 NOTE — Progress Notes (Signed)
Right groin sight has started to ooze. Dressing not completely saturated. Held pressure for and applied sandbag to site. Will continue to monitor closely.

## 2013-09-15 NOTE — ED Notes (Signed)
NOTIFIRD DR. Bebe Shaggy IN PERSON OF PATIENTS LAB RESULTS FOR CODE STROKE ON 09/15/2013.

## 2013-09-15 NOTE — ED Notes (Signed)
Dr. Cyril Mourning wanting to do TPa, pharmacy has been contacted. Foley cath will be inserted.

## 2013-09-15 NOTE — ED Notes (Signed)
Tpa gtt complete changed to NS

## 2013-09-15 NOTE — ED Notes (Signed)
RN from IR at bedside.

## 2013-09-15 NOTE — ED Provider Notes (Signed)
D/w neuro, will administer TPA and go to IR Pt stable at this time, resting comfortably   Joya Gaskins, MD 09/15/13 1701

## 2013-09-15 NOTE — ED Notes (Signed)
Granddaughter at bedside, reporting pt hasn't been taking Eliquis for a few days now because she ran out and was waiting to hear back from the pharmacy about a refill.

## 2013-09-15 NOTE — ED Notes (Signed)
Per EMS - pt LSN was 2pm, son went outside when he came back at 315pm he noticed she was altered, having left sided facial droop and left sided weakness. Upon EMS arrival they noticed all of the above and a right sided gaze, no hx of stroke, denies use of bld thinners. 20 G in left hand. BP 190/84 HR 74 CBG 142. Doesn't wear O2 at home.

## 2013-09-15 NOTE — ED Notes (Signed)
Initial stick

## 2013-09-15 NOTE — ED Provider Notes (Signed)
Patient seen/examined in the Emergency Department in conjunction with Resident Physician Provider [place] Patient reports left sided weakness Exam : left facial droop noted Plan: awaiting neuro evaluation, may be candidate for acute intervention Pt was seen on arrival to the ER and then when she returned from CT imaging   Joya Gaskins, MD 09/15/13 1617

## 2013-09-15 NOTE — ED Notes (Signed)
Activase bolus finished.

## 2013-09-16 ENCOUNTER — Inpatient Hospital Stay (HOSPITAL_COMMUNITY): Payer: Medicare Other

## 2013-09-16 DIAGNOSIS — I6350 Cerebral infarction due to unspecified occlusion or stenosis of unspecified cerebral artery: Principal | ICD-10-CM

## 2013-09-16 LAB — LIPID PANEL
Cholesterol: 84 mg/dL (ref 0–200)
HDL: 38 mg/dL — ABNORMAL LOW (ref >39)
LDL CALC: 33 mg/dL (ref 0–99)
TRIGLYCERIDES: 67 mg/dL (ref <150)
Total CHOL/HDL Ratio: 2.2 RATIO
VLDL: 13 mg/dL (ref 0–40)

## 2013-09-16 LAB — APTT: aPTT: 33 seconds (ref 24–37)

## 2013-09-16 LAB — GLUCOSE, CAPILLARY
Glucose-Capillary: 91 mg/dL (ref 70–99)
Glucose-Capillary: 93 mg/dL (ref 70–99)
Glucose-Capillary: 113 mg/dL — ABNORMAL HIGH (ref 70–99)
Glucose-Capillary: 106 mg/dL — ABNORMAL HIGH (ref 70–99)

## 2013-09-16 LAB — HEMOGLOBIN A1C
HEMOGLOBIN A1C: 6.5 % — AB (ref <5.7)
Mean Plasma Glucose: 140 mg/dL — ABNORMAL HIGH (ref <117)

## 2013-09-16 LAB — PROTIME-INR
INR: 1.29 (ref 0.00–1.49)
Prothrombin Time: 15.8 seconds — ABNORMAL HIGH (ref 11.6–15.2)

## 2013-09-16 NOTE — Progress Notes (Signed)
Made Dr. Corliss Skains aware of patients site oozing. Will continue to monitor patient. Received orders for a pt/ptt and INR.

## 2013-09-16 NOTE — Progress Notes (Signed)
09/16/13 Neuro IR  Rt 5 french femoral sheath removed at 854. Sheath was extremely kinked and oozing around site was noticed.  5Fr Exoseal was used to achieve hemostasis at 910. Distal pulses intact. No hematoma visualized. Pressure dresssing and sandbag placed on site.  Reviewed with Toma Copier RN. No complications. [facility] RT-R [employer] RT-R

## 2013-09-16 NOTE — ED Provider Notes (Signed)
I have personally seen and examined the patient.  I have discussed the plan of care with the resident.  I have reviewed the documentation on PMH/FH/Soc. History.  I have reviewed the documentation of the resident and agree.  I have reviewed and agree with the ECG interpretation(s) documented by the resident.  CRITICAL CARE Performed by: Joya Gaskins Total critical care time: 31 Critical care time was exclusive of separately billable procedures and treating other patients. Critical care was necessary to treat or prevent imminent or life-threatening deterioration. Critical care was time spent personally by me on the following activities: development of treatment plan with patient and/or surrogate as well as nursing, discussions with consultants, evaluation of patient's response to treatment, examination of patient, obtaining history from patient or surrogate, ordering and performing treatments and interventions, ordering and review of laboratory studies, ordering and review of radiographic studies, pulse oximetry and re-evaluation of patient's condition.   Pt checked on multiple times, found to have acute CVA and after d/w on call neurologist plan was to administer TPA and admit to ICU and possible thrombectomy by IR.  Pt stabilized in the ED   Joya Gaskins, MD 09/16/13 Paulo Fruit

## 2013-09-16 NOTE — Evaluation (Signed)
Clinical/Bedside Swallow Evaluation Patient Details  Name: [name] MRN: [id number] Date of Birth: 02-12-1921  Today's Date: 09/16/2013 Time: 0086-7619 SLP Time Calculation (min): 8 min  Past Medical History:  Past Medical History  Diagnosis Date  . Diabetes mellitus   . Glaucoma   . A-fib   . Myocardial infarction     3.5x32mm Veriflex stent 02/03/2012  . Coronary artery disease   . Hypothyroidism   . CHF (congestive heart failure)   . Arthritis     RA   Past Surgical History:  Past Surgical History  Procedure Laterality Date  . Eye surgery    . Cardiac catheterization  02/03/12  . Tonsillectomy     HPI:  80 y.o. female with acute onset left HP, left face weakness, and dysarthria. NIHSS 19. CT brain with no acute ischemia but mildly hyperdense right MCA. Her neurological syndrome is consistent with a right MCA distribution infarct. S/P RT common carotid arteriogram.   Assessment / Plan / Recommendation Clinical Impression  Pt presents with a neurogenic dysphagia marked by multiple clinical symptoms - motor and sensory impairment, inferred delays and pharyngeal weakness.  There was probable aspiration, with immediate cough response occurring after consumption of thin and nectar-thick liquids.  Function is further inhibited by fluctuating level of alertness.  Recommend initiating a dysphagia 1 diet with honey-thick liquids from spoon only; crush meds.  Will f/u next date to determine readiness for instrumental swallow study.  D/W RN.    Aspiration Risk  Severe (without diet modifications)   Diet Recommendation Dysphagia 1 (Puree);Honey-thick liquid   Liquid Administration via: Spoon Medication Administration: Crushed with puree Supervision: Full supervision/cueing for compensatory strategies;Staff to assist with self feeding Compensations: Slow rate;Small sips/bites;Check for anterior loss;Clear throat intermittently Postural Changes and/or Swallow Maneuvers: Seated  upright 90 degrees    Other  Recommendations Recommended Consults: MBS Oral Care Recommendations: Oral care BID Other Recommendations: Order thickener from pharmacy   Follow Up Recommendations   (tba)    Frequency and Duration min 3x week  2 weeks       SLP Swallow Goals   See care plan  Swallow Study Prior Functional Status       General Date of Onset: 09/15/13  Type of Study: Bedside swallow evaluation Previous Swallow Assessment: none Diet Prior to this Study: NPO Temperature Spikes Noted: No Respiratory Status: Room air History of Recent Intubation: No Behavior/Cognition: Alert;Cooperative Oral Cavity - Dentition: Edentulous Self-Feeding Abilities: Needs assist Patient Positioning: Upright in bed Baseline Vocal Quality: Clear Volitional Cough: Strong Volitional Swallow: Able to elicit    Oral/Motor/Sensory Function Overall Oral Motor/Sensory Function:  (decrease CN VII left )   Ice Chips Ice chips: Impaired Presentation: Spoon Oral Phase Impairments: Reduced labial seal Oral Phase Functional Implications: Left anterior spillage Pharyngeal Phase Impairments: Throat Clearing - Immediate   Thin Liquid Thin Liquid: Impaired Presentation: Spoon;Cup Oral Phase Impairments: Reduced labial seal Oral Phase Functional Implications: Left anterior spillage Pharyngeal  Phase Impairments: Suspected delayed Swallow;Decreased hyoid-laryngeal movement;Cough - Immediate    Nectar Thick Nectar Thick Liquid: Impaired Presentation: Cup Oral Phase Impairments: Reduced labial seal Oral phase functional implications: Left anterior spillage Pharyngeal Phase Impairments: Suspected delayed Swallow;Decreased hyoid-laryngeal movement;Multiple swallows;Cough - Immediate   Honey Thick Honey Thick Liquid: Impaired Presentation: Spoon Oral Phase Impairments: Reduced labial seal Oral Phase Functional Implications: Left anterior spillage Pharyngeal Phase Impairments: Suspected delayed  Swallow;Decreased hyoid-laryngeal movement   Puree Puree: Impaired Presentation: Spoon Oral Phase Impairments: Reduced labial seal Oral  Phase Functional Implications: Left anterior spillage Pharyngeal Phase Impairments: Suspected delayed Swallow;Decreased hyoid-laryngeal movement   Solid   GO    Solid: Not tested      Karen Caponi L. Samson [name], [place] CCC/SLP Pager [phone]  [name] 09/16/2013,3:49 PM

## 2013-09-16 NOTE — Evaluation (Signed)
Physical Therapy Evaluation Patient Details Name: [name] MRN: [id number] DOB: March 21, 1921 Today's Date: 09/16/2013 Time: 4765-4650 PT Time Calculation (min): 23 min  PT Assessment / Plan / Recommendation History of Present Illness  80 y.o. female who has known Afib and on Eliquis.  Talking to her granddaughter she has not refilled her Elequis and she is sure she has not taken this medication since 09/03/13.  Patient was her normal self this AM per son and was walking around.  At 15:00 hours he noted she called his name and he noted she was not moving her right side and unable to answer his questions appropriately. On exam she was showing left arm flaccidity , left leg hemiparesis, left facial droop and right gaze preference.   Clinical Impression  Pt with increased lethargy today however participatory. Pt with significant L sided weakness and neglect. Per family pt indep with cane PTA now requiring maximal assist for adls and mobility. Pt to benefit from CIR upon d/c to achieve maximal functional recovery to decrease burden of care upon d/c home with family.    PT Assessment  Patient needs continued PT services    Follow Up Recommendations  Supervision/Assistance - 24 hour;CIR    Does the patient have the potential to tolerate intense rehabilitation      Barriers to Discharge        Equipment Recommendations   (TBD)    Recommendations for Other Services Rehab consult   Frequency Min 4X/week    Precautions / Restrictions Precautions Precautions: Fall Precaution Comments: L sided neglect Restrictions Weight Bearing Restrictions: No   Pertinent Vitals/Pain L LE pain in weight-bearing      Mobility  Bed Mobility Overal bed mobility: +2 for physical assistance General bed mobility comments: assist for trunk elevation and to bring hips to EOB, no pt initiation Transfers Overall transfer level: Needs assistance Equipment used: 2 person hand held assist Transfers: Sit  to/from Stand Sit to Stand: +2 physical assistance;Total assist General transfer comment: pt unable to achieve full upright position. pt with c/o L LE pain. used gait belt and bed pad to achieve full upright position Ambulation/Gait Ambulation/Gait assistance:  (not assessed)    Exercises     PT Diagnosis: Difficulty walking;Hemiplegia non-dominant side  PT Problem List: Decreased strength;Decreased activity tolerance;Decreased balance;Decreased mobility PT Treatment Interventions: DME instruction;Gait training;Functional mobility training;Therapeutic activities;Therapeutic exercise;Neuromuscular re-education;Balance training     PT Goals(Current goals can be found in the care plan section) Acute Rehab PT Goals Patient Stated Goal: didn't report PT Goal Formulation: Patient unable to participate in goal setting Time For Goal Achievement: 09/30/13 Potential to Achieve Goals: Fair  Visit Information  Last PT Received On: 09/16/13 Assistance Needed: +2 History of Present Illness: 80 y.o. female who has known Afib and on Eliquis.  Talking to her granddaughter she has not refilled her Elequis and she is sure she has not taken this medication since 09/03/13.  Patient was her normal self this AM per son and was walking around.  At 15:00 hours he noted she called his name and he noted she was not moving her right side and unable to answer his questions appropriately. On exam she was showing left arm flaccidity , left leg hemiparesis, left facial droop and right gaze preference.        Prior Functioning  Home Living Family/patient expects to be discharged to:: Inpatient rehab Living Arrangements: Other relatives (grandson) Available Help at Discharge: Family;Available 24 hours/day Type of Home: House Additional  Comments: Prior Function Level of Independence:   Comments: pt reports using a cane, family reports pt to be indep with ADLs and used a cane PTA Communication Communication:   (delayed response time, lethargic, minimal eye opening) Dominant Hand: Right    Cognition  Cognition Arousal/Alertness: Awake/alert Behavior During Therapy: WFL for tasks assessed/performed Overall Cognitive Status: No family/caregiver present to determine baseline cognitive functioning    Extremity/Trunk Assessment Upper Extremity Assessment Upper Extremity Assessment: LUE deficits/detail LUE Deficits / Details: pt with some tone in bicep otherwise flaccid. pt with no withdrawl to deep pressure to nail bed LUE Sensation: decreased light touch Lower Extremity Assessment Lower Extremity Assessment: LLE deficits/detail LLE Deficits / Details: initiated L LAQ in sitting, impaired sensation Cervical / Trunk Assessment Cervical / Trunk Assessment: Kyphotic   Balance Balance Overall balance assessment: Needs assistance Sitting-balance support: Feet supported Sitting balance-Leahy Scale: Poor Dynamic Sitting - Comments: pt with L laterl bias, unable to indentify midline or achieve midline. pt with impaired propicoception Postural control: Left lateral lean  End of Session PT - End of Session Equipment Utilized During Treatment: Gait belt Activity Tolerance: Patient limited by lethargy Patient left: in bed;with call bell/phone within reach Nurse Communication: Mobility status  GP     Marcene Brawn 09/16/2013, 5:03 PM  Lewis Shock, PT, DPT Pager #: [phone] Office #: [phone]

## 2013-09-16 NOTE — Progress Notes (Signed)
  Echocardiogram 2D Echocardiogram has been performed.  [name] 09/16/2013, 12:38 PM

## 2013-09-16 NOTE — Evaluation (Signed)
Speech Language Pathology Evaluation Patient Details Name: SHEVETTE BESS MRN: [id number] DOB: [birth year] Today's Date: 09/16/2013 Time: 7322-0254 SLP Time Calculation (min): 15 min  Problem List:  Patient Active Problem List   Diagnosis Date Noted  . Stroke 09/15/2013  . Palpitations 03/15/2013  . Sinus bradycardia 03/15/2013  . Chest pressure 03/15/2013  . Chronic systolic CHF (congestive heart failure) EF30% 03/15/2013  . Stented coronary artery 05/14 by dr Jacinto Halim 03/15/2013  . Systolic CHF, acute 02/05/2012  . Atrial fibrillation 02/04/2012  . Acute MI anterior wall first episode care 05/14 02/03/2012  . CAD (coronary artery disease), native coronary artery 02/03/2012  . Type II or unspecified type diabetes mellitus without mention of complication, not stated as uncontrolled 02/03/2012   Past Medical History:  Past Medical History  Diagnosis Date  . Diabetes mellitus   . Glaucoma   . A-fib   . Myocardial infarction     3.5x49mm Veriflex stent 02/03/2012  . Coronary artery disease   . Hypothyroidism   . CHF (congestive heart failure)   . Arthritis     RA   Past Surgical History:  Past Surgical History  Procedure Laterality Date  . Eye surgery    . Cardiac catheterization  02/03/12  . Tonsillectomy     HPI:  80 y.o. female with acute onset left HP, left face weakness, and dysarthria. NIHSS 19. CT brain with no acute ischemia but mildly hyperdense right MCA. Her neurological syndrome is consistent with a right MCA distribution infarct. S/P RT common carotid arteriogram.   Assessment / Plan / Recommendation Clinical Impression  Pt presents with cognitive deficits - baseline status is unknown.  Currently presents with right gaze preference; inattention to left and impaired awareness of deficits.  Pt friendly, talkative, speech functional - only mildly dysarthric.  Slightly confabulatory; disoriented to elements of time and situation.  Demonstrating inconsistent ability to  recall events from earlier in the day.  Recommend SLP f/u for cognition; will need to determine baseline function.     SLP Assessment  Patient needs continued Speech Lanaguage Pathology Services    Follow Up Recommendations   (tba)    Frequency and Duration min 2x/week  2 weeks   Pertinent Vitals/Pain No pain   SLP Goals  SLP Goals Potential to Achieve Goals: Good  SLP Evaluation Prior Functioning  Cognitive/Linguistic Baseline: Information not available   Cognition  Overall Cognitive Status: No family/caregiver present to determine baseline cognitive functioning Arousal/Alertness: Awake/alert Orientation Level: Oriented to person;Oriented to place;Disoriented to time;Disoriented to situation Attention: Focused Focused Attention: Impaired Focused Attention Impairment: Verbal basic;Functional basic Memory: Impaired Memory Impairment: Storage deficit;Retrieval deficit;Decreased recall of new information Awareness: Impaired Awareness Impairment: Intellectual impairment Problem Solving: Impaired Problem Solving Impairment: Verbal basic;Functional basic Safety/Judgment: Impaired    Comprehension  Auditory Comprehension Overall Auditory Comprehension: Appears within functional limits for tasks assessed Visual Recognition/Discrimination Discrimination: Not tested Reading Comprehension Reading Status: Not tested    Expression Expression Primary Mode of Expression: Verbal Verbal Expression Overall Verbal Expression: Appears within functional limits for tasks assessed Initiation: No impairment Written Expression Written Expression: Not tested   Oral / Motor Oral Motor/Sensory Function Overall Oral Motor/Sensory Function: Impaired Motor Speech Overall Motor Speech: Appears within functional limits for tasks assessed   Jayleen Scaglione L. Samson [place], [place] CCC/SLP Pager [phone]      [name] 09/16/2013, 4:07 PM

## 2013-09-16 NOTE — Progress Notes (Signed)
Spoke with Dr. Amada Jupiter about patient's MRI results. Will continue to monitor.

## 2013-09-16 NOTE — Progress Notes (Signed)
OT Cancellation Note  Patient Details Name: [name] MRN: [id number] DOB: 12/29/1920   Cancelled Treatment:    Reason Eval/Treat Not Completed: Medical issues which prohibited therapy Will see this pm if able. If not, will assess in am. [facility] Jaelyn Cloninger, OTR/L  [phone] 09/16/2013 09/16/2013, 10:10 AM

## 2013-09-16 NOTE — Progress Notes (Signed)
07:15 R groin checked from previous drainage, site marked. Pulses noted by doppler.  08:10 R groin site continuing to ooze. Site soft, no hematoma noted. Margins marked. Slight pressure applied. Pulses noted by doppler.   08:25 top dressing removed to reveal original dressing. Under dressing, sheath noted to be kinked. Radiology PA paged immediately. Per Pam, PA, reinforce dressing and apply sandbag. PA to contact IR for sheath removal as soon as possible. Dressing reinforced and 10lb sandbag applied. Will continue to monitor.   Holly Bodily

## 2013-09-16 NOTE — Progress Notes (Signed)
Held pressure on sheath site for again bc site continued to ooze. Reinforced dressing and placed sandbag on site again.

## 2013-09-16 NOTE — Progress Notes (Signed)
PT Cancellation Note  Patient Details Name: [name] MRN: [id number] DOB: 04-03-1921   Cancelled Treatment:    Reason Eval/Treat Not Completed: Medical issues which prohibited therapy. Will see this afternoon if able. If not, will assess in the am. Pt with femoral sheath removed this morning and has increased oozing at site.    [name], [place] [facility] [place] [phone] 09/16/2013, 11:12 AM

## 2013-09-16 NOTE — Progress Notes (Signed)
SLP Cancellation Note  Patient Details Name: ARILYN BRIERLEY MRN: [id number] DOB: [birth year]   Cancelled treatment:       Reason Eval/Treat Not Completed: Medical issues which prohibited therapy (Patient unable to be positioned upright. Will f/u in pm. )  Ferdinand Lango MA, CCC-SLP [phone]  Amar Sippel Meryl 09/16/2013, 8:20 AM

## 2013-09-16 NOTE — Progress Notes (Signed)
Stroke Team Progress Note  HISTORY [name] is an 80 y.o. female who has known Afib and on Eliquis. Talking to her granddaughter she has not refilled her Elequis and she is sure she has not taken this medication since 09/03/13. Patient was her normal self this AM 09/15/2013 per son and was walking around. At 15:00 hours he noted she called his name and he noted she was not moving her right side and unable to answer his questions appropriately. On exam she was showing left arm flaccidity , left leg hemiparesis, left facial droop and right gaze preference. Patient was given TPA. She was taken for an angiogram which showed right MCA occlusion. Intervention was not attempted as it was anticipated to be technically challenging and family did not want her to receive general anesthesia.  She was admitted to the neuro ICU for further evaluation and treatment.  SUBJECTIVE No family is at the bedside.  Overall she feels her condition is stable.   OBJECTIVE Most recent Vital Signs: Filed Vitals:   09/16/13 0400 09/16/13 0500 09/16/13 0600 09/16/13 0700  BP: 153/69 143/44 113/84 136/57  Pulse: 63 65 59 83  Temp:      TempSrc:      Resp: 22 25 22 20   Height:      Weight:      SpO2: 100% 100% 100% 100%   CBG (last 3)   Recent Labs  09/15/13 1629 09/15/13 1954  GLUCAP 152* 129*    IV Fluid Intake:   . sodium chloride    . sodium chloride 75 mL/hr at 09/16/13 0700    MEDICATIONS  . allopurinol  100 mg Oral Daily  . brimonidine  1 drop Both Eyes Q12H   And  . timolol  1 drop Both Eyes Q12H  . brinzolamide  1 drop Both Eyes TID  . carvedilol  3.125 mg Oral Q breakfast  . cholecalciferol  400 Units Oral Daily  . glipiZIDE  5 mg Oral Daily  . isosorbide mononitrate  30 mg Oral Daily  . latanoprost  1 drop Both Eyes QHS  . levothyroxine  125 mcg Oral Daily  . metFORMIN  1,000 mg Oral BID WC  . metFORMIN  500 mg Oral q1800  . metFORMIN  500 mg Oral q1800  . [START ON 09/19/2013]  methotrexate  10 mg Oral Weekly  . pantoprazole (PROTONIX) IV  40 mg Intravenous QHS  . prednisoLONE acetate  1 drop Right Eye BID  . ramipril  1.25 mg Oral Daily  . simvastatin  10 mg Oral q1800   PRN:  acetaminophen, acetaminophen, labetalol, nitroGLYCERIN, senna-docusate  Diet:  NPO  Activity:  Bedrest DVT Prophylaxis:  SCDs   CLINICALLY SIGNIFICANT STUDIES Basic Metabolic Panel:  Recent Labs Lab 09/15/13 1606 09/15/13 1612  NA 145 143  K 4.4 4.2  CL 108 109  CO2 23  --   GLUCOSE 157* 158*  BUN 16 16  CREATININE 0.79 0.80  CALCIUM 9.8  --    Liver Function Tests:  Recent Labs Lab 09/15/13 1606  AST 14  ALT 8  ALKPHOS 99  BILITOT 0.4  PROT 6.8  ALBUMIN 3.7   CBC:  Recent Labs Lab 09/15/13 1606 09/15/13 1612  WBC 5.7  --   NEUTROABS 3.7  --   HGB 11.3* 12.9  HCT 33.9* 38.0  MCV 97.1  --   PLT 111*  --    Coagulation:  Recent Labs Lab 09/15/13 1606 09/16/13 0540  LABPROT 14.8  15.8*  INR 1.19 1.29   Cardiac Enzymes:  Recent Labs Lab 09/15/13 1607  TROPONINI <0.30   Urinalysis:  Recent Labs Lab 09/15/13 1709  COLORURINE YELLOW  LABSPEC 1.019  PHURINE 6.0  GLUCOSEU NEGATIVE  HGBUR SMALL*  BILIRUBINUR NEGATIVE  KETONESUR 15*  PROTEINUR NEGATIVE  UROBILINOGEN 1.0  NITRITE NEGATIVE  LEUKOCYTESUR NEGATIVE   Lipid Panel    Component Value Date/Time   CHOL 84 09/16/2013 0540   TRIG 67 09/16/2013 0540   HDL 38* 09/16/2013 0540   CHOLHDL 2.2 09/16/2013 0540   VLDL 13 09/16/2013 0540   LDLCALC 33 09/16/2013 0540   HgbA1C  Lab Results  Component Value Date   HGBA1C 5.6 03/15/2013    Urine Drug Screen:     Component Value Date/Time   LABOPIA NONE DETECTED 09/15/2013 1709   COCAINSCRNUR NONE DETECTED 09/15/2013 1709   LABBENZ NONE DETECTED 09/15/2013 1709   AMPHETMU NONE DETECTED 09/15/2013 1709   THCU NONE DETECTED 09/15/2013 1709   LABBARB NONE DETECTED 09/15/2013 1709    Alcohol Level:  Recent Labs Lab 09/15/13 1606  ETH <11     CT of the  brain  09/15/2013    No intracranial hemorrhage.  Slightly hyperdense right carotid terminus and M1 segment of the right middle cerebral artery. Thrombus not excluded in proper clinical setting.  Right occipital white matter type changes may represent result of prior infarct although slightly atypical as noted above.  Mild atrophy.  The dens projects superiorly which may reflect cranial settling with mild encroachment on the cervical medullary junction.    Cerebral Angiogram  RT CFa approach.  RT MCA occlusion.  Extensive tortuosity of aortic arch.  MRI of the brain    MRA of the brain    2D Echocardiogram    Carotid Doppler    CXR    EKG  atrial fibrillation, rate 61.   Therapy Recommendations   Physical Exam   Frail elderly african american lady not in distress.Awake alert. Afebrile. Head is nontraumatic. Neck is supple without bruit. . Cardiac exam no murmur or gallop. Lungs are clear to auscultation. Distal pulses are well felt. Neurological Exam : Awake alert globally aphasic. Speaks few words mostly nonsensical and short sentences. Will follow only occasional midline commands. Left gaze deviation has some spontaneous eye movements to the right but will not cross midline. Does not blink to threat on the right but does so on the left. Right lower facial weakness. Tongue midline. Motor system exam reveals dense right hemiplegia and will withdraw minimally right upper and lower extremity to pain only. Purposeful antigravity movements in the left side. Sensation diminished on the right. Right plantar is upgoing and left is downgoing. Gait was not tested. ASSESSMENT Ms. [name] is a 80 y.o. female presenting with right hemiparesis and expressive aphasia. S/P IV tPA 09/15/2013 at 1649. Cerebral angio confirmed R MCA occlusion. She did not receive intervention due to vessel tortuosity and family refusal for general anaesthesia consent. Sheath out this am. Imaging pending. Suspectleft MCA  infarct. Infarct felt to be embolic secondary to known atrial fibrillation.  On apixaban, but not taking since 09/03/13 prior to admission. Now on no antithrombotics as within 24h of tpa for secondary stroke prevention. Patient with resultant confusion, dysarthria, dense left hemiplegia, left HH, gaze deviation. Work up underway.   ? Baseline dementia Atrial fibrillation. Did not get Rx refulled for apixaban in Dec. Need to find out reasoning behing Diabetes, HgbA1c pending, goal < 7.0  CAD - stent 01/2012 CHF RA LDL 30  Hospital day # 1  TREATMENT/PLAN  Add aspirin for secondary stroke prevention if 24h imaging negative for hemorrhage. Will plan to resume anticoagulation at discharge - would like to speak with family related why pt stopped taking.  Check swallow. Diet per ST  Once bedrest complete post sheath removal, ok to be OOB. Therapy evals.  D/c scheduled zofran.  Anticipate transfer to the floor in am  Annie Main, MSN, RN, ANVP-BC, ANP-BC, GNP-BC [facility] Pager: [phone] 09/16/2013 8:06 AM This patient is critically ill and at significant risk of neurological worsening, death and care requires constant monitoring of vital signs, hemodynamics,respiratory and cardiac monitoring,review of multiple databases, neurological assessment, discussion with family, other specialists and medical decision making of high complexity. I spent 30 minutes of neurocritical care time  in the care of  this patient. I have personally obtained a history, examined the patient, evaluated imaging results, and formulated the assessment and plan of care. I agree with the above. Delia Heady, MD

## 2013-09-16 NOTE — Progress Notes (Addendum)
*  PRELIMINARY RESULTS* Vascular Ultrasound Carotid Duplex (Doppler) has been completed. Study was technically difficult due to poor patient cooperation.  Findings suggest 1-39% internal carotid artery stenosis bilaterally. The right vertebral artery is patent with antegrade flow. Unable to visualize the left vertebral artery.  09/16/2013 3:07 PM Gertie Fey, RVT, RDCS, RDMS

## 2013-09-17 ENCOUNTER — Inpatient Hospital Stay (HOSPITAL_COMMUNITY): Payer: Medicare Other

## 2013-09-17 DIAGNOSIS — I619 Nontraumatic intracerebral hemorrhage, unspecified: Secondary | ICD-10-CM

## 2013-09-17 DIAGNOSIS — I69991 Dysphagia following unspecified cerebrovascular disease: Secondary | ICD-10-CM

## 2013-09-17 LAB — GLUCOSE, CAPILLARY
GLUCOSE-CAPILLARY: 144 mg/dL — AB (ref 70–99)
Glucose-Capillary: 133 mg/dL — ABNORMAL HIGH (ref 70–99)
Glucose-Capillary: 135 mg/dL — ABNORMAL HIGH (ref 70–99)
Glucose-Capillary: 122 mg/dL — ABNORMAL HIGH (ref 70–99)

## 2013-09-17 MED ORDER — METFORMIN HCL ER 500 MG PO TB24
1000.0000 mg | ORAL_TABLET | Freq: Every day | ORAL | Status: DC
  Administered 2013-09-18 – 2013-09-21 (×4): 1000 mg via ORAL
  Filled 2013-09-17 (×5): qty 2

## 2013-09-17 MED ORDER — RESOURCE THICKENUP CLEAR PO POWD
ORAL | Status: DC | PRN
  Filled 2013-09-17 (×2): qty 125

## 2013-09-17 NOTE — Progress Notes (Signed)
Patient currently not at a level to tolerate intense inpt rehab. I will follow but recommend SNF at this time. Please call me for any questions. [phone]

## 2013-09-17 NOTE — Progress Notes (Signed)
Dr.M.  kirkpatrick notified of situation and ordered rt elbow and rt hand x-ray. Order carried out.

## 2013-09-17 NOTE — Progress Notes (Signed)
Physical Therapy Treatment Patient Details Name: [name] MRN: [id number] DOB: [birth year] Today's Date: 09/17/2013 Time: 7829-5621 PT Time Calculation (min): 28 min  PT Assessment / Plan / Recommendation  History of Present Illness 80 y.o. female who has known Afib and on Eliquis.  Talking to her granddaughter she has not refilled her Elequis and she is sure she has not taken this medication since 09/03/13.  Patient was her normal self this AM per son and was walking around.  At 15:00 hours he noted she called his name and he noted she was not moving her right side and unable to answer his questions appropriately. On exam she was showing left arm flaccidity , left leg hemiparesis, left facial droop and right gaze preference.    PT Comments   Pt able to transfer to chair with 2+ (A) to chair. Pt became more aroused sitting EOB. Pt continues to have Lt lateral lean bias in sitting position and with Lt sided neglect. Cont to recommend CIR for continued rehab. Pt with good family support.   Follow Up Recommendations  CIR;Supervision/Assistance - 24 hour     Does the patient have the potential to tolerate intense rehabilitation     Barriers to Discharge        Equipment Recommendations  Other (comment) (TBD)    Recommendations for Other Services Rehab consult  Frequency Min 4X/week   Progress towards PT Goals Progress towards PT goals: Progressing toward goals  Plan Current plan remains appropriate    Precautions / Restrictions Precautions Precautions: Fall Precaution Comments: L sided neglect Restrictions Weight Bearing Restrictions: No   Pertinent Vitals/Pain VSS     Mobility  Bed Mobility Overal bed mobility: +2 for physical assistance General bed mobility comments: use of draw pad to bring trunk to elevated postition and to bring hips to EOB; pt did not initiate any movement today; c/o pain in bil knees  Transfers Overall transfer level: Needs assistance Equipment used:  2 person hand held assist Transfers: Sit to/from Stand;Stand Pivot Transfers Sit to Stand: +2 physical assistance;Total assist Stand pivot transfers: +2 physical assistance;+2 safety/equipment General transfer comment: pt required 2+ total (A) for sit to stand and SPT; (A) to extend at hips and continued to have fwd flexed posture during standing; required LEs to be blocked to prevent buckling; c/o pain in bil LEs; used draw pad to extend the hips and facilitate weight shift into chair ; max multimodal cues for hand placement and sequencing; pt with difficulty with motor planning and sequencing with cues          PT Diagnosis:    PT Problem List:   PT Treatment Interventions:     PT Goals (current goals can now be found in the care plan section) Acute Rehab PT Goals Patient Stated Goal: did not state PT Goal Formulation: Patient unable to participate in goal setting Time For Goal Achievement: 09/30/13 Potential to Achieve Goals: Fair  Visit Information  Last PT Received On: 09/17/13 Assistance Needed: +2 History of Present Illness: 80 y.o. female who has known Afib and on Eliquis.  Talking to her granddaughter she has not refilled her Elequis and she is sure she has not taken this medication since 09/03/13.  Patient was her normal self this AM per son and was walking around.  At 15:00 hours he noted she called his name and he noted she was not moving her right side and unable to answer his questions appropriately. On exam she was showing  left arm flaccidity , left leg hemiparesis, left facial droop and right gaze preference.     Subjective Data  Subjective: pt lying supine with nurses present; pt responding with multimodal cueing and eyes closed  Patient Stated Goal: did not state   Cognition  Cognition Arousal/Alertness: Lethargic Behavior During Therapy: WFL for tasks assessed/performed Overall Cognitive Status: No family/caregiver present to determine baseline cognitive functioning     Balance  Balance Overall balance assessment: Needs assistance Sitting-balance support: Feet supported;Single extremity supported Sitting balance-Leahy Scale: Poor Dynamic Sitting - Comments: pt with Lt lateral lean bias, pt acheived midline with max (A) but unable to maintain; attempted weightbearing for proprioception through Lt UE; tolerated sitting EOB ~ 7 min with (A)  Postural control: Left lateral lean  End of Session PT - End of Session Equipment Utilized During Treatment: Gait belt Activity Tolerance: Patient limited by lethargy Patient left: in chair;with call bell/phone within reach;with family/visitor present Nurse Communication: Mobility status;Precautions;Need for lift equipment   GP     Donell Sievert, [place] [phone] 09/17/2013, 12:07 PM

## 2013-09-17 NOTE — Consult Note (Signed)
Physical Medicine and Rehabilitation Consult Reason for Consult: Right MCA infarct Referring Physician: Dr. Pearlean Brownie   HPI: [name] is a 80 y.o. right-handed female with history of diabetes mellitus with peripheral neuropathy, rheumatoid arthritis with weekly methotrexate, atrial fibrillation maintained on Eliquis, and systolic congestive heart failure and coronary artery disease with stenting. Patient independent with a cane prior to admission. Admitted 09/15/2013 with leftsided weakness and aphasia. CT of the brain showed no acute ischemic changes and a mildly hyperdense right MCA. Patient did receive TPA. MRI of the brain 09/16/2013 shows hemorrhagic conversion of right MCA infarct with a prominent hemorrhage involving the right lentiform nucleus. Cerebral angiogram confirmed right MCA occlusion. She did not receive intervention due to the vessel tortuosity and family refusal for general anesthesia consent. Carotid Dopplers with no ICA stenosis. Echocardiogram pending. Neurology services consulted and workup ongoing. Currently maintained on a dysphagia 1 honey thick liquid diet. Physical therapy evaluation completed 09/16/2013 with recommendations for physical medicine rehabilitation consult to consider inpatient rehabilitation services  Patient in bed responds verbally in short phrases does not open eyes Review of Systems  Cardiovascular: Positive for palpitations and leg swelling.  Musculoskeletal: Positive for joint pain and myalgias.  All other systems reviewed and are negative.   Past Medical History  Diagnosis Date  . Diabetes mellitus   . Glaucoma   . A-fib   . Myocardial infarction     3.5x77mm Veriflex stent 02/03/2012  . Coronary artery disease   . Hypothyroidism   . CHF (congestive heart failure)   . Arthritis     RA   Past Surgical History  Procedure Laterality Date  . Eye surgery    . Cardiac catheterization  02/03/12  . Tonsillectomy     History reviewed. No  pertinent family history. Social History:  reports that she has never smoked. She does not have any smokeless tobacco history on file. She reports that she does not drink alcohol. Her drug history is not on file. Allergies: No Known Allergies Medications Prior to Admission  Medication Sig Dispense Refill  . allopurinol (ZYLOPRIM) 100 MG tablet Take 100 mg by mouth daily.      Marland Kitchen apixaban (ELIQUIS) 5 MG TABS tablet Take 5 mg by mouth daily.      . bimatoprost (LUMIGAN) 0.03 % ophthalmic solution Place 1 drop into both eyes at bedtime.      . brimonidine-timolol (COMBIGAN) 0.2-0.5 % ophthalmic solution Place 1 drop into both eyes every 12 (twelve) hours.      . brinzolamide (AZOPT) 1 % ophthalmic suspension Place 1 drop into both eyes 3 (three) times daily.      . carvedilol (COREG) 3.125 MG tablet Take 3.125 mg by mouth daily with breakfast.      . glipiZIDE (GLUCOTROL XL) 5 MG 24 hr tablet Take 5 mg by mouth daily.      . isosorbide mononitrate (IMDUR) 30 MG 24 hr tablet Take 30 mg by mouth daily.      Marland Kitchen levothyroxine (SYNTHROID, LEVOTHROID) 125 MCG tablet Take 125 mcg by mouth daily.      . metFORMIN (GLUCOPHAGE) 500 MG tablet Take 500 mg by mouth 2 (two) times daily with a meal. Take 2 tablets(1,000mg ) in the morning and 1 tablet (500mg ) at night      . methotrexate (RHEUMATREX) 2.5 MG tablet Take 10 mg by mouth once a week. Caution:Chemotherapy. Protect from light. On Saturdays      . nitroGLYCERIN (NITROSTAT) 0.4 MG SL tablet Place  0.4 mg under the tongue every 5 (five) minutes as needed for chest pain.      . pravastatin (PRAVACHOL) 20 MG tablet Take 20 mg by mouth every evening.      . prednisoLONE acetate (PRED FORTE) 1 % ophthalmic suspension Place 1 drop into the right eye 2 (two) times daily.      . ramipril (ALTACE) 1.25 MG capsule Take 1.25 mg by mouth daily.      . vitamin B-12 (CYANOCOBALAMIN) 1000 MCG tablet Take 1,000 mcg by mouth daily.        Home: Home  Living Family/patient expects to be discharged to:: Inpatient rehab Living Arrangements: Other relatives (grandson) Available Help at Discharge: Family;Available 24 hours/day Type of Home: House Additional Comments: pt poor historian and unable to provide accurate PLOF. per NSG pt lives with son however unclear  Functional History: Prior Function Comments: pt reports using a cane Functional Status:  Mobility:          ADL:    Cognition: Cognition Overall Cognitive Status: No family/caregiver present to determine baseline cognitive functioning Arousal/Alertness: Awake/alert Orientation Level: Oriented to person;Oriented to place;Oriented to time Attention: Focused Focused Attention: Impaired Focused Attention Impairment: Verbal basic;Functional basic Memory: Impaired Memory Impairment: Storage deficit;Retrieval deficit;Decreased recall of new information Awareness: Impaired Awareness Impairment: Intellectual impairment Problem Solving: Impaired Problem Solving Impairment: Verbal basic;Functional basic Safety/Judgment: Impaired Cognition Arousal/Alertness: Awake/alert Behavior During Therapy: WFL for tasks assessed/performed Overall Cognitive Status: No family/caregiver present to determine baseline cognitive functioning  Blood pressure 143/55, pulse 88, temperature 99.2 F (37.3 C), temperature source Oral, resp. rate 19, height 5\' 4"  (1.626 m), weight 57 kg (125 lb 10.6 oz), SpO2 100.00%. Physical Exam  Vitals reviewed. HENT:  Left facial droop  Eyes:  Pupils round reactive to light  Neck: Normal range of motion. Neck supple. No thyromegaly present.  Cardiovascular:  Cardiac rate controlled  Respiratory:  Lungs decreased breath sounds at the bases clear to auscultation  GI: Soft. Bowel sounds are normal. She exhibits no distension.  Neurological:  Patient was arousable but kept her eyes closed during exam  She did have a left facial droop with drooling. She was  able to state her name and place but limited awareness of her deficits. 0/5 in the left deltoid, bicep, tricep, grip, hip flexor, knee extensors, ankle dorsiflexor plantar flexor /5 in the right deltoid, bicep, tricep, grip, hip flexor, knee extensors, ankle dorsiflexion plantar flexor Sensory absent to light touch in the left upper and left lower limb Withdraws to pain left lower extremity but not left upper extremity  Skin: Skin is warm and dry.    Results for orders placed during the hospital encounter of 09/15/13 (from the past 24 hour(s))  GLUCOSE, CAPILLARY     Status: None   Collection Time    09/16/13 11:49 AM      Result Value Range   Glucose-Capillary 93  70 - 99 mg/dL  GLUCOSE, CAPILLARY     Status: Abnormal   Collection Time    09/16/13  5:17 PM      Result Value Range   Glucose-Capillary 113 (*) 70 - 99 mg/dL   Comment 1 Notify RN     Comment 2 Documented in Chart    GLUCOSE, CAPILLARY     Status: Abnormal   Collection Time    09/16/13 10:34 PM      Result Value Range   Glucose-Capillary 106 (*) 70 - 99 mg/dL   Ct Head Wo Contrast  09/15/2013   CLINICAL DATA:  Slurred speech.  EXAM: CT HEAD WITHOUT CONTRAST  TECHNIQUE: Contiguous axial images were obtained from the base of the skull through the vertex without intravenous contrast.  COMPARISON:  None.  FINDINGS: No intracranial hemorrhage.  Slightly hyperdense right carotid terminus and M1 segment of the right middle cerebral artery. Thrombus not excluded in proper clinical setting.  Right occipital white matter type changes spare the overlying gray matter as may be expected with encephalomalacia from prior infarct. Vasogenic edema therefore not entirely excluded (although no surrounding mass effect upon the lateral ventricle). Contrast-enhanced examination could be performed to exclude underlying enhancing lesion.  Mild atrophy. Ventricular prominence felt to be related to atrophy rather than hydrocephalus.  The dens projects  superiorly which may reflect cranial settling with mild encroachment on the cervical medullary junction.  IMPRESSION: No intracranial hemorrhage.  Slightly hyperdense right carotid terminus and M1 segment of the right middle cerebral artery. Thrombus not excluded in proper clinical setting.  Right occipital white matter type changes may represent result of prior infarct although slightly atypical as noted above.  Mild atrophy.  The dens projects superiorly which may reflect cranial settling with mild encroachment on the cervical medullary junction.  These results were called by telephone at the time of interpretation on 09/15/2013 at 4:18 PM to Dr. Cyril Mourning, who verbally acknowledged these results.   Electronically Signed   By: Bridgett Larsson M.D.   On: 09/15/2013 16:27   Mr Brain Wo Contrast  09/16/2013   CLINICAL DATA:  New onset of left-sided weakness and right gaze preference. Status post tPA. Right MCA occlusion.  EXAM: MRI HEAD WITHOUT CONTRAST  TECHNIQUE: Multiplanar, multiecho pulse sequences of the brain and surrounding structures were obtained without intravenous contrast.  COMPARISON:  CT head without contrast 09/15/2013.  FINDINGS: A punctate acute nonhemorrhagic infarct is noted within the right cerebellum. A new hemorrhagic infarct is present over the right basal ganglia. The largest area of hemorrhages centered by over the lentiform nucleus, measuring 1.8 x 3.6 x 3.5 cm. Additional petechial hemorrhage infiltrates the caudate nucleus in. There is surrounding edema, particularly within the external capsule. There is partial effacement of the right lateral ventricle. 1-2 mm of midline shift is evident.  Remote encephalomalacia is noted in the right occipital lobe. A there are remote blood products associated with this infarct. 9 mm focus of remote hemorrhage is seen in the high left parietal lobe.  Moderate generalized atrophy and white matter disease otherwise likely reflects the sequela of chronic  microvascular ischemia.  Flow is present in the major intracranial arteries, including the right middle cerebral artery. The patient is status post bilateral lens extractions. The paranasal sinuses are clear. The mastoid air cells are clear.  IMPRESSION: 1. Hemorrhagic conversion of the right MCA infarct with a prominent hemorrhage involving the right lentiform nucleus in the 2nd hemorrhage involving the right caudate head. 2. Partial effacement of the right lateral ventricle and minimal midline shift. 3. Remote encephalomalacia of the right occipital lobe with a a minimal amount of remote blood products. 4. 9 mm area of remote hemorrhage involving the high left parietal lobe. 5. Moderate generalized atrophy and white matter otherwise suggests chronic microvascular ischemia. Critical Value/emergent results were called by telephone at the time of interpretation on 09/16/2013 at 7:24 PM to Dr. Amada Jupiter, who verbally acknowledged these results.   Electronically Signed   By: Gennette Pac M.D.   On: 09/16/2013 19:28   Dg Chest Port 1  View  09/16/2013   CLINICAL DATA:  History of CVA, post angiogram images.  EXAM: PORTABLE CHEST - 1 VIEW  COMPARISON:  None.  FINDINGS: The cardiac silhouette is enlarged. The pulmonary vascularity is prominent centrally. There is no pleural effusion. There is no alveolar infiltrate. The observed portions of the bony structures appear normal.  IMPRESSION: There is mild enlargement of cardiac silhouette and prominence of the central pulmonary vascularity which may reflect a low-grade CHF. There is no evidence of atelectasis or nor pneumonia.   Electronically Signed   By: David  [place]   On: 09/16/2013 12:29   Ir Angio Intra Extracran Sel Com Carotid Innominate Uni R Mod Sed  09/16/2013   CLINICAL DATA:  Acute onset of left-sided paralysis, right gaze deviation. Hyperdense right MCA sign.  EXAM: IR ANGIO INTRA EXTRACRAN SEL COM CAROTID INNOMINATE UNI RIGHT MOD SED AND RIGHT VERTEBRAL  ARTERY ANGIOGRAM:  MEDICATIONS: Versed 2 mg IV. Fentanyl 50 mcg IV.  ANESTHESIA/SEDATION: Conscious sedation.  CONTRAST:  35 OMNIPAQUE IOHEXOL 300 MG/ML  SOLN  PROCEDURE: Following a full explanation of the procedure along with the potential associated complications, an informed witnessed consent was obtained.  The right groin was prepped and draped in the usual sterile fashion. Thereafter using modified Seldinger technique, transfemoral access into the right common femoral artery was obtained without difficulty. Over a 0.035-inch guidewire, a 5 [place] Pinnacle sheath was inserted. Through this and also over a 0.035-inch guidewire, a 5 [place] JB1 catheter was advanced to the aortic arch region and selectively positioned into the right common carotid artery and innominate artery.  There were no acute complications. The patient tolerated the procedure well.  COMPLICATIONS: None immediate.  FINDINGS: The right common carotid arteriogram demonstrates the right external carotid artery and its major branches to be grossly normal.  The right internal carotid artery at the bulb to the cranial skull base is normal.  The petrous, the cavernous and the supraclinoid segments are widely patent.  Normal opacification is seen of the right anterior cerebral artery into the capillary and venous phases.  There is complete angiographic occlusion of the right middle cerebral artery in its proximal one-third.  Transient opacification of the right posterior communicating artery is seen.  The right vertebral artery origin is normal. The vessel opacifies normally to the cranial skull base. There is normal opacification of the right vertebrobasilar junction and the right posterior inferior cerebellar artery.  The opacified portions of the basilar artery, the posterior cerebral arteries, the superior cerebellar arteries and the anterior inferior cerebellar arteries is normal into the delayed arterial phase.  IMPRESSION: Angiographically  occluded right middle cerebral artery in its proximal one-third.   Electronically Signed   By: Julieanne Cotton M.D.   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 Does the need for close, 24 hr/day medical supervision in concert with the patient's rehab needs make it unreasonable for this patient to be served in a less intensive setting? Potentially 2. Co-Morbidities requiring supervision/potential complications: CAD, atrial fibrillation, systolic CHF 3. Due to bladder management, bowel management, safety, skin/wound care, disease management, medication administration, pain management and patient education, does the patient require 24 hr/day rehab nursing? Potentially 4. Does the patient require coordinated care of a physician, rehab nurse, PT (1-2 hrs/day, 5 days/week), OT (1-2 hrs/day, 5 days/week) and SLP (0.5-1 hrs/day, 5 days/week) to address physical and functional deficits in  the context of the above medical diagnosis(es)? Potentially Addressing deficits in the following areas: balance, endurance, locomotion, strength, transferring, bowel/bladder control, bathing, dressing, feeding, grooming, toileting, cognition, speech, language, swallowing and psychosocial support 5. Can the patient actively participate in an intensive therapy program of at least 3 hrs of therapy per day at least 5 days per week? No 6. The potential for patient to make measurable gains while on inpatient rehab is poor 7. Anticipated functional outcomes upon discharge from inpatient rehab are N/A with PT, NA with OT, NA with SLP. 8. Estimated rehab length of stay to reach the above functional goals is: NA 9. Does the patient have adequate social supports to accommodate these discharge functional goals? Potentially 10. Anticipated D/C setting: SNF 11. Anticipated post D/C treatments:  SNF 12. Overall Rehab/Functional Prognosis: fair  RECOMMENDATIONS: This patient's condition is appropriate for continued rehabilitative care in the following setting: Unable to tolerate CIR at the current time, given age and severity of stroke, recommend SNF Patient has agreed to participate in recommended program. Potentially Note that insurance prior authorization may be required for reimbursement for recommended care.  Comment: Patient unable to tolerate PT and OT , severe cognitive deficits    09/17/2013

## 2013-09-17 NOTE — Procedures (Signed)
Objective Swallowing Evaluation: Modified Barium Swallowing Study  Patient Details  Name: [name] MRN: [id number] Date of Birth: 28-Aug-1921  Today's Date: 09/17/2013 Time: 3664-4034 SLP Time Calculation (min): 20 min  Past Medical History:  Past Medical History  Diagnosis Date  . Diabetes mellitus   . Glaucoma   . A-fib   . Myocardial infarction     3.5x27mm Veriflex stent 02/03/2012  . Coronary artery disease   . Hypothyroidism   . CHF (congestive heart failure)   . Arthritis     RA   Past Surgical History:  Past Surgical History  Procedure Laterality Date  . Eye surgery    . Cardiac catheterization  02/03/12  . Tonsillectomy     HPI:  80 y.o. female with acute onset left HP, left face weakness, and dysarthria. NIHSS 19. CT brain with no acute ischemia but mildly hyperdense right MCA. Her neurological syndrome is consistent with a right MCA distribution infarct. S/P RT common carotid arteriogram.     Assessment / Plan / Recommendation Clinical Impression  Dysphagia Diagnosis: Severe oral phase dysphagia;Severe pharyngeal phase dysphagia Clinical impression: Pt demonstrates severely impaired function with delayed and weak oral manipulation of honey thick and puree boluses. Pt was lethargic, requiring max cues to participate. Pts posture also worsens function; neck is contracted and head tilted to left with pooling of POs on the left. Honey thick liquids are aspirated with sensation before/during the swallow. It is difficult to visualize airway due to pts posture to determine if all of aspirate is expelled with cough. Puree/pudding is tolerated without aspiration, though a cued second swallow is helpful to remove vallecular residue caused by oral residuals spilling to valleculae post swallow. Pharyngeal strength is adequate. Pt is recommended to consume puree diet and pudding thick liquids at this time. Will initiate trials of honey teaspoon when posture and arousal are  maximal.     Treatment Recommendation  Therapy as outlined in treatment plan below    Diet Recommendation Dysphagia 1 (Puree);Pudding-thick liquid   Liquid Administration via: Spoon Medication Administration: Crushed with puree Supervision: Full supervision/cueing for compensatory strategies;Staff to assist with self feeding Compensations: Slow rate;Small sips/bites;Check for anterior loss;Multiple dry swallows after each bite/sip Postural Changes and/or Swallow Maneuvers: Seated upright 90 degrees    Other  Recommendations Oral Care Recommendations: Oral care BID Other Recommendations: Order thickener from pharmacy   Follow Up Recommendations       Frequency and Duration min 2x/week  2 weeks   Pertinent Vitals/Pain NA    SLP Swallow Goals     General HPI: 80 y.o. female with acute onset left HP, left face weakness, and dysarthria. NIHSS 19. CT brain with no acute ischemia but mildly hyperdense right MCA. Her neurological syndrome is consistent with a right MCA distribution infarct. S/P RT common carotid arteriogram. Type of Study: Modified Barium Swallowing Study Reason for Referral: Objectively evaluate swallowing function Previous Swallow Assessment: BSE 1/7 Diet Prior to this Study: Honey-thick liquids;Dysphagia 1 (puree) Temperature Spikes Noted: No Respiratory Status: Room air History of Recent Intubation: No Behavior/Cognition: Requires cueing Oral Cavity - Dentition: Edentulous Oral Motor / Sensory Function: Impaired - see Bedside swallow eval Self-Feeding Abilities: Total assist Patient Positioning: Postural control interferes with function Baseline Vocal Quality: Clear Volitional Cough: Weak Volitional Swallow: Able to elicit (with max cues) Pharyngeal Secretions: Not observed secondary MBS    Reason for Referral Objectively evaluate swallowing function   Oral Phase Oral Preparation/Oral Phase Oral Phase: Impaired Oral -  Honey Oral - Honey Teaspoon: Left  anterior bolus loss;Weak lingual manipulation;Lingual pumping;Incomplete tongue to palate contact;Reduced posterior propulsion;Holding of bolus;Left pocketing in lateral sulci;Delayed oral transit Oral - Solids Oral - Puree: Left anterior bolus loss;Weak lingual manipulation;Lingual pumping;Incomplete tongue to palate contact;Reduced posterior propulsion;Holding of bolus;Left pocketing in lateral sulci;Delayed oral transit   Pharyngeal Phase Pharyngeal Phase Pharyngeal Phase: Impaired Pharyngeal - Honey Pharyngeal - Honey Teaspoon: Premature spillage to pyriform sinuses;Delayed swallow initiation;Moderate aspiration;Penetration/Aspiration before swallow Penetration/Aspiration details (honey teaspoon): Material enters airway, passes BELOW cords and not ejected out despite cough attempt by patient Pharyngeal - Solids Pharyngeal - Puree: Delayed swallow initiation;Premature spillage to valleculae;Pharyngeal residue - valleculae  Cervical Esophageal Phase    GO             Harlon Ditty, MA CCC-SLP [phone]  Claudine Mouton 09/17/2013, 11:07 AM

## 2013-09-17 NOTE — Plan of Care (Signed)
Problem: tPA Day Progression Outcomes-Only if tPA administered Goal: Post tPA image without hemorrhage Outcome: Not Met (add Reason) Per ct scan

## 2013-09-17 NOTE — Progress Notes (Signed)
Patient complained of pain to rt hip this evening with a low grade temp of 100 and tylenol prn given. Writer called an hour later by family and they complained that patient broke her index finger toady while transferring to 4N30 bed from ICU. Charge nurse called in room to witness as this is the first time Clinical research associate heard of that. Informed family that MD will be notified and see if x-ray could be done to hip and finger. Family ok with plan at this time.

## 2013-09-17 NOTE — Progress Notes (Signed)
Stroke Team Progress Note  HISTORY [name] is an 80 y.o. female who has known Afib and on Eliquis. Talking to her granddaughter she has not refilled her Elequis and she is sure she has not taken this medication since 09/03/13. Patient was her normal self this AM 09/15/2013 per son and was walking around. At 15:00 hours he noted she called his name and he noted she was not moving her right side and unable to answer his questions appropriately. On exam she was showing left arm flaccidity , left leg hemiparesis, left facial droop and right gaze preference. Patient was given TPA. She was taken for an angiogram which showed right MCA occlusion. Intervention was not attempted as it was anticipated to be technically challenging and family did not want her to receive general anesthesia.  She was admitted to the neuro ICU for further evaluation and treatment.  SUBJECTIVE No family at bedside. Pt sleepy this am.   OBJECTIVE Most recent Vital Signs: Filed Vitals:   09/17/13 0600 09/17/13 0700 09/17/13 0750 09/17/13 0800  BP: 142/51 147/60  143/55  Pulse: 27 148 78 88  Temp:      TempSrc:      Resp: 25 26  19   Height:      Weight:      SpO2: 99% 99%  100%   CBG (last 3)   Recent Labs  09/16/13 1149 09/16/13 1717 09/16/13 2234  GLUCAP 93 113* 106*    IV Fluid Intake:   . sodium chloride      MEDICATIONS  . allopurinol  100 mg Oral Daily  . brimonidine  1 drop Both Eyes Q12H   And  . timolol  1 drop Both Eyes Q12H  . brinzolamide  1 drop Both Eyes TID  . carvedilol  3.125 mg Oral Q breakfast  . cholecalciferol  400 Units Oral Daily  . glipiZIDE  5 mg Oral Daily  . isosorbide mononitrate  30 mg Oral Daily  . latanoprost  1 drop Both Eyes QHS  . levothyroxine  125 mcg Oral Daily  . metFORMIN  1,000 mg Oral BID WC  . metFORMIN  500 mg Oral q1800  . metFORMIN  500 mg Oral q1800  . [START ON 09/19/2013] methotrexate  10 mg Oral Weekly  . pantoprazole (PROTONIX) IV  40 mg  Intravenous QHS  . prednisoLONE acetate  1 drop Right Eye BID  . ramipril  1.25 mg Oral Daily  . simvastatin  10 mg Oral q1800   PRN:  acetaminophen, acetaminophen, labetalol, nitroGLYCERIN, senna-docusate  Diet:  Dysphagia 1 honey thick liquids Activity:  Bedrest DVT Prophylaxis:  SCDs   CLINICALLY SIGNIFICANT STUDIES Basic Metabolic Panel:   Recent Labs Lab 09/15/13 1606 09/15/13 1612  NA 145 143  K 4.4 4.2  CL 108 109  CO2 23  --   GLUCOSE 157* 158*  BUN 16 16  CREATININE 0.79 0.80  CALCIUM 9.8  --    Liver Function Tests:   Recent Labs Lab 09/15/13 1606  AST 14  ALT 8  ALKPHOS 99  BILITOT 0.4  PROT 6.8  ALBUMIN 3.7   CBC:   Recent Labs Lab 09/15/13 1606 09/15/13 1612  WBC 5.7  --   NEUTROABS 3.7  --   HGB 11.3* 12.9  HCT 33.9* 38.0  MCV 97.1  --   PLT 111*  --    Coagulation:   Recent Labs Lab 09/15/13 1606 09/16/13 0540  LABPROT 14.8 15.8*  INR 1.19 1.29  Cardiac Enzymes:   Recent Labs Lab 09/15/13 1607  TROPONINI <0.30   Urinalysis:   Recent Labs Lab 09/15/13 1709  COLORURINE YELLOW  LABSPEC 1.019  PHURINE 6.0  GLUCOSEU NEGATIVE  HGBUR SMALL*  BILIRUBINUR NEGATIVE  KETONESUR 15*  PROTEINUR NEGATIVE  UROBILINOGEN 1.0  NITRITE NEGATIVE  LEUKOCYTESUR NEGATIVE   Lipid Panel    Component Value Date/Time   CHOL 84 09/16/2013 0540   TRIG 67 09/16/2013 0540   HDL 38* 09/16/2013 0540   CHOLHDL 2.2 09/16/2013 0540   VLDL 13 09/16/2013 0540   LDLCALC 33 09/16/2013 0540   HgbA1C  Lab Results  Component Value Date   HGBA1C 6.5* 09/16/2013    Urine Drug Screen:     Component Value Date/Time   LABOPIA NONE DETECTED 09/15/2013 1709   COCAINSCRNUR NONE DETECTED 09/15/2013 1709   LABBENZ NONE DETECTED 09/15/2013 1709   AMPHETMU NONE DETECTED 09/15/2013 1709   THCU NONE DETECTED 09/15/2013 1709   LABBARB NONE DETECTED 09/15/2013 1709    Alcohol Level:   Recent Labs Lab 09/15/13 1606  ETH <11     CT of the brain  09/15/2013    No  intracranial hemorrhage.  Slightly hyperdense right carotid terminus and M1 segment of the right middle cerebral artery. Thrombus not excluded in proper clinical setting.  Right occipital white matter type changes may represent result of prior infarct although slightly atypical as noted above.  Mild atrophy.  The dens projects superiorly which may reflect cranial settling with mild encroachment on the cervical medullary junction.    Cerebral Angiogram  RT CFa approach.  RT MCA occlusion.  Extensive tortuosity of aortic arch.  MRI of the brain  09/16/2013 1. Hemorrhagic conversion of the right MCA infarct with a prominent hemorrhage involving the right lentiform nucleus in the 2nd hemorrhage involving the right caudate head.  2. Partial effacement of the right lateral ventricle and minimal midline shift. 3. Remote encephalomalacia of the right occipital lobe with a a minimal amount of remote blood products. 4. 9 mm area of remote hemorrhage involving the high left parietal lobe. 5. Moderate generalized atrophy and white matter otherwise suggests chronic microvascular ischemia.   2D Echocardiogram    Carotid Doppler Findings suggest 1-39% internal carotid artery stenosis bilaterally. The right vertebral artery is patent with antegrade flow. Unable to visualize the left vertebral artery.  EKG  atrial fibrillation, rate 61.   Therapy Recommendations  CIR  Physical Exam   Frail elderly african american lady not in distress.Awake alert. Afebrile. Head is nontraumatic. Neck is supple without bruit. . Cardiac exam no murmur or gallop. Lungs are clear to auscultation. Distal pulses are well felt. Neurological Exam : Awake alert globally aphasic. Speaks few words mostly nonsensical and short sentences. Will follow only occasional midline commands. Left gaze deviation has some spontaneous eye movements to the right but will not cross midline. Does not blink to threat on the right but does so on the left. Right  lower facial weakness. Tongue midline. Motor system exam reveals dense right hemiplegia and will withdraw minimally right upper and lower extremity to pain only. Purposeful antigravity movements in the left side. Sensation diminished on the right. Right plantar is upgoing and left is downgoing. Gait was not tested.  ASSESSMENT Ms. [name] is a 80 y.o. female presenting with right hemiparesis and expressive aphasia. S/P IV tPA 09/15/2013 at 1649. Cerebral angio confirmed R MCA occlusion. She did not receive intervention due to vessel tortuosity and family  refusal for general anaesthesia. Imaging confirms hemorrhagic conversion of a right MCA infarct involving the right lentiform nucleus and a 2nd hemorrhage involving the right caudate head.  Cytotoxic cerebral edema with minimal midline shift. Infarct felt to be embolic secondary to known atrial fibrillation.  Post tPA hemorrhage asymptomatic. On apixaban, but not taking since 09/03/13 prior to admission. Now on no antithrombotics due to hemorrhagic transformation for secondary stroke prevention. Patient with resultant confusion, dysarthria, dense left hemiplegia, left HH, gaze deviation. Work up underway.   ? Baseline dementia Atrial fibrillation. Did not get Rx refulled for apixaban in Dec. 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 If bleed isodense, will add aspirin for secondary stroke prevention. 1 week later will stop aspirin and resume anticoagulation.   MBSS today to assess swallow  Transfer to the floor  Rehab consult  Annie Main, MSN, RN, ANVP-BC, ANP-BC, [facility] Pager: [phone] 09/17/2013 8:52 AM  I have personally obtained a history, examined the patient, evaluated imaging results,  and formulated the assessment and plan of care. I agree with the above.  Delia Heady, MD

## 2013-09-17 NOTE — Progress Notes (Signed)
OT cancellation   09/17/13 1200  OT Visit Information  Last OT Received On 09/17/13  Reason Eval/Treat Not Completed Patient at procedure or test/ unavailable (at [facility]. will see in am)  [facility], OTR/L  [phone] 09/17/2013

## 2013-09-18 LAB — GLUCOSE, CAPILLARY
GLUCOSE-CAPILLARY: 101 mg/dL — AB (ref 70–99)
GLUCOSE-CAPILLARY: 131 mg/dL — AB (ref 70–99)
GLUCOSE-CAPILLARY: 152 mg/dL — AB (ref 70–99)
Glucose-Capillary: 103 mg/dL — ABNORMAL HIGH (ref 70–99)

## 2013-09-18 MED ORDER — SODIUM CHLORIDE 0.9 % IV SOLN
100.0000 mL | INTRAVENOUS | Status: DC
  Administered 2013-09-18: 100 mL via INTRAVENOUS

## 2013-09-18 MED ORDER — PANTOPRAZOLE SODIUM 40 MG PO TBEC
40.0000 mg | DELAYED_RELEASE_TABLET | Freq: Every day | ORAL | Status: DC
  Administered 2013-09-18 – 2013-09-20 (×3): 40 mg via ORAL
  Filled 2013-09-18 (×2): qty 1

## 2013-09-18 NOTE — Progress Notes (Addendum)
Physical Therapy Treatment Patient Details Name: [name] MRN: [id number] DOB: 1920-12-12 Today's Date: 09/18/2013 Time: 1062-6948 PT Time Calculation (min): 18 min  PT Assessment / Plan / Recommendation  History of Present Illness 80 y.o. female who has known Afib and on Eliquis.  Talking to her granddaughter she has not refilled her Elequis and she is sure she has not taken this medication since 09/03/13.  Patient was her normal self this AM per son and was walking around.  At 15:00 hours he noted she called his name and he noted she was not moving her right side and unable to answer his questions appropriately. On exam she was showing left arm flaccidity , left leg hemiparesis, left facial droop and right gaze preference.    PT Comments   Pt limited with participation at this time, minimal arousal due to change of position.  Some active movement with ability to follow some intermittent commands but in general ability to participate with PT very limited at this time. Family concerned regarding poor po intake due to decr arousal.    Follow Up Recommendations  SNF;Supervision/Assistance - 24 hour (may need to consider palliative consult)           Equipment Recommendations  Other (comment) (TBD)    Recommendations for Other Services Rehab consult  Frequency Min 4X/week   Progress towards PT Goals Progress towards PT goals: Progressing toward goals  Plan Current plan remains appropriate    Precautions / Restrictions Precautions Precautions: Fall Precaution Comments: L sided neglect Restrictions Weight Bearing Restrictions: No   Pertinent Vitals/Pain NAD    Mobility  Bed Mobility Overal bed mobility: +2 for physical assistance;Needs Assistance Bed Mobility: Supine to Sit;Sit to Supine Supine to sit: +2 for physical assistance;Total assist Sit to supine: Total assist;+2 for physical assistance General bed mobility comments: no active initiation noted    Exercises   Minimal active ROM ankle/toe pumps     PT Goals (current goals can now be found in the care plan section) Acute Rehab PT Goals Patient Stated Goal: did not state PT Goal Formulation: Patient unable to participate in goal setting Time For Goal Achievement: 09/30/13 Potential to Achieve Goals: Fair  Visit Information  Last PT Received On: 09/18/13 Assistance Needed: +2 History of Present Illness: 80 y.o. female who has known Afib and on Eliquis.  Talking to her granddaughter she has not refilled her Elequis and she is sure she has not taken this medication since 09/03/13.  Patient was her normal self this AM per son and was walking around.  At 15:00 hours he noted she called his name and he noted she was not moving her right side and unable to answer his questions appropriately. On exam she was showing left arm flaccidity , left leg hemiparesis, left facial droop and right gaze preference.     Subjective Data  Patient Stated Goal: did not state   Cognition  Cognition Arousal/Alertness: Lethargic Behavior During Therapy: Flat affect Overall Cognitive Status: Impaired/Different from baseline Area of Impairment: Problem solving;Attention Current Attention Level:  (aroused to focused) Problem Solving: Slow processing General Comments: Pt verbalized taking gout medication, minimal responses during session to fully understand cognitive state    Balance  Balance Overall balance assessment: Needs assistance Sitting-balance support: Feet supported Sitting balance-Leahy Scale: Zero Dynamic Sitting - Comments: pt with L laterl bias, unable to indentify midline or achieve midline  End of Session PT - End of Session Equipment Utilized During Treatment: Gait belt Activity  Tolerance: Patient limited by lethargy Patient left: in bed;with call bell/phone within reach;with bed alarm set;with family/visitor present Nurse Communication: Mobility status;Precautions;Need for lift equipment   GP      [name] 09/18/2013, 4:34 PM [name], PT DPT  [phone]

## 2013-09-18 NOTE — Progress Notes (Signed)
Speech Language Pathology Treatment: Dysphagia  Patient Details Name: [name] MRN: [id number] DOB: 1920-11-22 Today's Date: 09/18/2013 Time: 4709-6283 SLP Time Calculation (min): 12 min  Assessment / Plan / Recommendation Clinical Impression  Today pt seen to assess tolerance of po diet and to educate family present to precautions.  Pt lethargic and did not awaken adequately for po intake.  Small amount attempted via spoon which pt held in anterior oral cavity and SLP removed with oral suction.    Intake has been poor due to lethargy.  Former daughter in Social worker [name] present reports pt has been lethargic since admit.  Concern for ability to meet nutritional needs present.    Recommend to continue puree/pudding with STRICT precautions only when pt fully alert.  Demonstrated to "[name]" use of oral suction and precautions.    If pt's mental status/lethargy does not improve significantly, ? If she would benefit from consideration for alternative means of nutrition for support due to inability to consume adequate po.     [name] [name]: 80 y.o. female with acute onset left HP, left face weakness, and dysarthria. NIHSS 19. CT brain with no acute ischemia but mildly hyperdense right MCA. Her neurological syndrome is consistent with a right MCA distribution infarct. S/P RT common carotid arteriogram.   Pertinent Vitals Afebrile, decreased- congested cough- slp orally suctioned secretions  SLP Plan  Continue with current plan of care    Recommendations Diet recommendations: Dysphagia 1 (puree);Pudding-thick liquid Liquids provided via: Teaspoon Medication Administration: Crushed with puree Supervision: Full supervision/cueing for compensatory strategies;Trained caregiver to feed patient Compensations: Slow rate;Small sips/bites;Check for anterior loss (oral suction after intake) Postural Changes and/or Swallow Maneuvers: Seated upright 90 degrees;Upright 30-60 min after meal              Oral Care  Recommendations: Oral care BID Follow up Recommendations: Skilled Nursing facility Plan: Continue with current plan of care    GO     Karen Burnet, MS [facility] SLP [phone]

## 2013-09-18 NOTE — Progress Notes (Signed)
Stroke Team Progress Note  HISTORY [name] is an 80 y.o. female who has known Afib and on Eliquis. Talking to her granddaughter she has not refilled her Elequis and she is sure she has not taken this medication since 09/03/13. Patient was her normal self this AM 09/15/2013 per son and was walking around. At 15:00 hours he noted she called his name and he noted she was not moving her right side and unable to answer his questions appropriately. On exam she was showing left arm flaccidity , left leg hemiparesis, left facial droop and right gaze preference. Patient was given TPA. She was taken for an angiogram which showed right MCA occlusion. Intervention was not attempted as it was anticipated to be technically challenging and family did not want her to receive general anesthesia.  She was admitted to the neuro ICU for further evaluation and treatment.  SUBJECTIVE Daughter at bedside. Pt sleepy this am. She passed mod barium swallow y`day   OBJECTIVE Most recent Vital Signs: Filed Vitals:   09/18/13 0157 09/18/13 0619 09/18/13 0927 09/18/13 1438  BP: 149/83 142/76 148/73 103/68  Pulse: 87 98 85 74  Temp: 98 F (36.7 C) 99.8 F (37.7 C) 99.5 F (37.5 C) 98.3 F (36.8 C)  TempSrc: Oral Oral Oral Axillary  Resp: 20 18 18 16   Height:      Weight:      SpO2: 100% 98% 99% 98%   CBG (last 3)   Recent Labs  09/17/13 2017 09/18/13 0608 09/18/13 1126  GLUCAP 144* 101* 131*    IV Fluid Intake:   . sodium chloride 75 mL/hr at 09/17/13 1130  . sodium chloride 100 mL (09/18/13 1453)    MEDICATIONS  . allopurinol  100 mg Oral Daily  . brimonidine  1 drop Both Eyes Q12H   And  . timolol  1 drop Both Eyes Q12H  . brinzolamide  1 drop Both Eyes TID  . carvedilol  3.125 mg Oral Q breakfast  . cholecalciferol  400 Units Oral Daily  . glipiZIDE  5 mg Oral Daily  . isosorbide mononitrate  30 mg Oral Daily  . latanoprost  1 drop Both Eyes QHS  . levothyroxine  125 mcg Oral Daily  .  metFORMIN  1,000 mg Oral Q breakfast  . metFORMIN  500 mg Oral q1800  . [START ON 09/19/2013] methotrexate  10 mg Oral Weekly  . pantoprazole  40 mg Oral QHS  . prednisoLONE acetate  1 drop Right Eye BID  . ramipril  1.25 mg Oral Daily  . simvastatin  10 mg Oral q1800   PRN:  acetaminophen, acetaminophen, labetalol, nitroGLYCERIN, RESOURCE THICKENUP CLEAR, senna-docusate  Diet:  Dysphagia 1 honey thick liquids Activity:  Bedrest DVT Prophylaxis:  SCDs   CLINICALLY SIGNIFICANT STUDIES Basic Metabolic Panel:   Recent Labs Lab 09/15/13 1606 09/15/13 1612  NA 145 143  K 4.4 4.2  CL 108 109  CO2 23  --   GLUCOSE 157* 158*  BUN 16 16  CREATININE 0.79 0.80  CALCIUM 9.8  --    Liver Function Tests:   Recent Labs Lab 09/15/13 1606  AST 14  ALT 8  ALKPHOS 99  BILITOT 0.4  PROT 6.8  ALBUMIN 3.7   CBC:   Recent Labs Lab 09/15/13 1606 09/15/13 1612  WBC 5.7  --   NEUTROABS 3.7  --   HGB 11.3* 12.9  HCT 33.9* 38.0  MCV 97.1  --   PLT 111*  --  Coagulation:   Recent Labs Lab 09/15/13 1606 09/16/13 0540  LABPROT 14.8 15.8*  INR 1.19 1.29   Cardiac Enzymes:   Recent Labs Lab 09/15/13 1607  TROPONINI <0.30   Urinalysis:   Recent Labs Lab 09/15/13 1709  COLORURINE YELLOW  LABSPEC 1.019  PHURINE 6.0  GLUCOSEU NEGATIVE  HGBUR SMALL*  BILIRUBINUR NEGATIVE  KETONESUR 15*  PROTEINUR NEGATIVE  UROBILINOGEN 1.0  NITRITE NEGATIVE  LEUKOCYTESUR NEGATIVE   Lipid Panel    Component Value Date/Time   CHOL 84 09/16/2013 0540   TRIG 67 09/16/2013 0540   HDL 38* 09/16/2013 0540   CHOLHDL 2.2 09/16/2013 0540   VLDL 13 09/16/2013 0540   LDLCALC 33 09/16/2013 0540   HgbA1C  Lab Results  Component Value Date   HGBA1C 6.5* 09/16/2013    Urine Drug Screen:     Component Value Date/Time   LABOPIA NONE DETECTED 09/15/2013 1709   COCAINSCRNUR NONE DETECTED 09/15/2013 1709   LABBENZ NONE DETECTED 09/15/2013 1709   AMPHETMU NONE DETECTED 09/15/2013 1709   THCU NONE DETECTED  09/15/2013 1709   LABBARB NONE DETECTED 09/15/2013 1709    Alcohol Level:   Recent Labs Lab 09/15/13 1606  ETH <11     CT of the brain  09/15/2013    No intracranial hemorrhage.  Slightly hyperdense right carotid terminus and M1 segment of the right middle cerebral artery. Thrombus not excluded in proper clinical setting.  Right occipital white matter type changes may represent result of prior infarct although slightly atypical as noted above.  Mild atrophy.  The dens projects superiorly which may reflect cranial settling with mild encroachment on the cervical medullary junction.    Cerebral Angiogram  RT CFa approach.  RT MCA occlusion.  Extensive tortuosity of aortic arch.  MRI of the brain  09/16/2013 1. Hemorrhagic conversion of the right MCA infarct with a prominent hemorrhage involving the right lentiform nucleus in the 2nd hemorrhage involving the right caudate head.  2. Partial effacement of the right lateral ventricle and minimal midline shift. 3. Remote encephalomalacia of the right occipital lobe with a a minimal amount of remote blood products. 4. 9 mm area of remote hemorrhage involving the high left parietal lobe. 5. Moderate generalized atrophy and white matter otherwise suggests chronic microvascular ischemia.   2D Echocardiogram    Carotid Doppler Findings suggest 1-39% internal carotid artery stenosis bilaterally. The right vertebral artery is patent with antegrade flow. Unable to visualize the left vertebral artery.  EKG  atrial fibrillation, rate 61.   Therapy Recommendations  CIR  Physical Exam   Frail elderly african american lady not in distress.Awake alert. Afebrile. Head is nontraumatic. Neck is supple without bruit. . Cardiac exam no murmur or gallop. Lungs are clear to auscultation. Distal pulses are well felt. Neurological Exam : Awake alert globally aphasic. Speaks few words mostly nonsensical and short sentences. Will follow only occasional midline commands. Left  gaze deviation has some spontaneous eye movements to the right but will not cross midline. Does not blink to threat on the right but does so on the left. Right lower facial weakness. Tongue midline. Motor system exam reveals dense right hemiplegia and will withdraw minimally right upper and lower extremity to pain only. Purposeful antigravity movements in the left side. Sensation diminished on the right. Right plantar is upgoing and left is downgoing. Gait was not tested.  ASSESSMENT [name] is a 80 y.o. female presenting with right hemiparesis and expressive aphasia. S/P IV tPA  09/15/2013 at 1649. Cerebral angio confirmed R MCA occlusion. She did not receive intervention due to vessel tortuosity and family refusal for general anaesthesia. Imaging confirms hemorrhagic conversion of a right MCA infarct involving the right lentiform nucleus and a 2nd hemorrhage involving the right caudate head.  Cytotoxic cerebral edema with minimal midline shift. Infarct felt to be embolic secondary to known atrial fibrillation.  Post tPA hemorrhage asymptomatic. On apixaban, but not taking since 09/03/13 prior to admission. Now on no antithrombotics due to hemorrhagic transformation for secondary stroke prevention. Patient with resultant confusion, dysarthria, dense left hemiplegia, left HH, gaze deviation. Work up underway.   ? Baseline dementia Atrial fibrillation. Did not get Rx refulled for apixaban in Dec. 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 If bleed isodense, will add aspirin for secondary stroke prevention. 1 week later will stop aspirin and resume anticoagulation.    Continue therapies  SNF disposition pending t  Annie Main, MSN, RN, ANVP-BC, ANP-BC, GNP-BC [facility] Pager: [phone] 09/18/2013 4:01 PM  I have personally obtained a history, examined the patient, evaluated imaging results, and formulated the assessment and plan of care. I agree with the above.  Delia Heady, MD

## 2013-09-18 NOTE — Clinical Social Work Psychosocial (Signed)
Clinical Social Work Department BRIEF PSYCHOSOCIAL ASSESSMENT 09/18/2013  Patient:  [name], [name]     Account Number:  [id number]     Admit date:  09/15/2013  Clinical Social Worker:  Sherre Lain  Date/Time:  09/18/2013 02:43 PM  Referred by:  Physician  Date Referred:  09/18/2013 Referred for  SNF Placement   Other Referral:   none.   Interview type:  Family Other interview type:   CSW spoke to pt's granddaughter, Adrian Saran, via phone.    PSYCHOSOCIAL DATA Living Status:  FACILITY Admitted from facility:   Level of care:  Assisted Living Primary support name:  Adrian Saran Primary support relationship to patient:  FAMILY Degree of support available:   Strong support system.    CURRENT CONCERNS Current Concerns  Post-Acute Placement   Other Concerns:   Unclear of living arrangement prior to admission to [facility]. Per RN note, pt is from assisted living (no name given). Weekend CSW to please follow-up with family.    SOCIAL WORK ASSESSMENT / PLAN CSW spoke to pt's granddaughter, Adrian Saran [phone]), regarding discharge disposition. Merrilyn Puma was unaware that CIR had denied pt admission and is now suggesting SNF. Merrilyn Puma was hesitant to SNF placement, but was more agreeable once she understood that pt would still be receiving therapies at SNF. Merrilyn Puma stated that she would like for pt to be placed in [facility], even though pt is from [facility] Merrilyn Puma is aware that weekend CSW is to present SNF bed offers to her over weekend and that she and family are to make a decision regarding SNF placement. CSW will continue to follow and assist with discharge planning needs.   Assessment/plan status:  Psychosocial Support/Ongoing Assessment of Needs Other assessment/ plan:   none.   Information/referral to community resources:   [facility] SNF placement bed offers.    PATIENTS/FAMILYS RESPONSE TO PLAN OF CARE: Merrilyn Puma was hesitant about SNF  placement at first, but became open to it after CSW discussed and explained SNF placement for PT/OT therapies. At the end of the conversation, Merrilyn Puma was agreeable to SNF placement for pt and appreciative of CSW explaining process to her.     Darlyn Chamber, LCSWA Clinical Social Worker [phone]

## 2013-09-18 NOTE — Evaluation (Signed)
Occupational Therapy Evaluation Patient Details Name: [name] MRN: [id number] DOB: 11-10-1920 Today's Date: 09/18/2013 Time: 1884-1660 OT Time Calculation (min): 18 min  OT Assessment / Plan / Recommendation History of present illness 80 y.o. female who has known Afib and on Eliquis.  Talking to her granddaughter she has not refilled her Elequis and she is sure she has not taken this medication since 09/03/13.  Patient was her normal self this AM per son and was walking around.  At 15:00 hours he noted she called his name and he noted she was not moving her right side and unable to answer his questions appropriately. On exam she was showing left arm flaccidity , left leg hemiparesis, left facial droop and right gaze preference.    Clinical Impression   PT admitted with Lt arm flaccid, Lt hemiparesis. Pt currently with functional limitiations due to the deficits listed below (see OT problem list).  Pt will benefit from skilled OT to increase their independence and safety with adls and balance to allow discharge SNf. Pt with arousal due to change of position during session but questions ability to participate with OT at this time. OT to follow acutely and monitor. Recommend PALLIATIVE consult . Pt with poor po intake due to decr arousal. Family expressed concerns because patient is not eating.     OT Assessment  Patient needs continued OT Services    Follow Up Recommendations  SNF;Supervision/Assistance - 24 hour    Barriers to Discharge      Equipment Recommendations  Wheelchair (measurements OT);Wheelchair cushion (measurements OT);Hospital bed;Other (comment) (lift - palliative care ???)    Recommendations for Other Services    Frequency  Min 2X/week    Precautions / Restrictions Precautions Precautions: Fall Precaution Comments: L sided neglect Restrictions Weight Bearing Restrictions: No   Pertinent Vitals/Pain Pain in RT side: elbow, wrist and knee    ADL  ADL  Comments: pt total +2 supine sit EOB with poor sitting balance. pt total (A) at EOB. pt opening eyes with Rt deviation noted. Pt seeing objects on right side. pt not tracking to midline. pt following minimal verbal cues and decr arousal during session. pt responds to painful stimuli. Pt moving toes and LT LE with nail bed pressure on x3 attempts. Pt unable to eat lunch due to poor arousal. Family requesting OT feed staff and educated on risk for aspiration and needs incr arousal to attempt    OT Diagnosis: Generalized weakness;Cognitive deficits;Disturbance of vision;Acute pain  OT Problem List: Decreased strength;Decreased range of motion;Decreased activity tolerance;Impaired balance (sitting and/or standing);Decreased safety awareness;Decreased knowledge of use of DME or AE;Decreased knowledge of precautions;Impaired sensation;Impaired tone;Impaired UE functional use;Pain OT Treatment Interventions: Self-care/ADL training;Therapeutic exercise;Neuromuscular education;DME and/or AE instruction;Therapeutic activities;Cognitive remediation/compensation;Patient/family education;Visual/perceptual remediation/compensation;Balance training   OT Goals(Current goals can be found in the care plan section) Acute Rehab OT Goals Patient Stated Goal: did not state OT Goal Formulation: With family Time For Goal Achievement: 10/02/13 Potential to Achieve Goals: Poor  Visit Information  Last OT Received On: 09/18/13 Assistance Needed: +2 History of Present Illness: 80 y.o. female who has known Afib and on Eliquis.  Talking to her granddaughter she has not refilled her Elequis and she is sure she has not taken this medication since 09/03/13.  Patient was her normal self this AM per son and was walking around.  At 15:00 hours he noted she called his name and he noted she was not moving her right side and unable to answer his  questions appropriately. On exam she was showing left arm flaccidity , left leg hemiparesis,  left facial droop and right gaze preference.        Prior Functioning     Home Living Family/patient expects to be discharged to:: Skilled nursing facility Additional Comments: pt poor historian and unable to provide accurate PLOF. per NSG pt lives with son however unclear Prior Function Comments: pt reports using a cane Communication Communication: Expressive difficulties;Other (comment) (lethargic, minimal eye opening, spontaneous responses) Dominant Hand: Right         Vision/Perception Vision - History Baseline Vision: Wears glasses only for reading Visual History: Glaucoma;Cataracts;Other (comment) (daugther reports she takes medication for glaucoma) Patient Visual Report: Other (comment) (right deviation of vision) Vision - Assessment Eye Alignment: Impaired (comment) Vision Assessment: Vision impaired - to be further tested in functional context   Cognition  Cognition Arousal/Alertness: Lethargic Behavior During Therapy: Flat affect Overall Cognitive Status: Impaired/Different from baseline Area of Impairment: Problem solving;Attention Current Attention Level:  (aroused to focused) Problem Solving: Slow processing General Comments: Pt verbalized taking gout medication, minimal responses during session to fully understand cognitive state    Extremity/Trunk Assessment Upper Extremity Assessment Upper Extremity Assessment: LUE deficits/detail;RUE deficits/detail RUE Deficits / Details: responding to any tactile input with withdrawal due to pain from gout LUE Deficits / Details: flaccid during sesison, no pain response, no grimance to pain stimuli at all, no arom noted at all LUE Sensation: decreased light touch;decreased proprioception LUE Coordination: decreased fine motor;decreased gross motor Lower Extremity Assessment Lower Extremity Assessment: Defer to PT evaluation     Mobility Bed Mobility Overal bed mobility: +2 for physical assistance;Needs  Assistance Bed Mobility: Supine to Sit;Sit to Supine Supine to sit: +2 for physical assistance;Total assist Sit to supine: Total assist;+2 for physical assistance General bed mobility comments: no active initiation noted     Exercise     Balance Balance Overall balance assessment: Needs assistance Sitting-balance support: Feet supported;No upper extremity supported Sitting balance-Leahy Scale: Zero   End of Session OT - End of Session Activity Tolerance: Patient limited by fatigue Patient left: in bed;with bed alarm set;with family/visitor present Nurse Communication: Mobility status;Precautions;Need for lift equipment  GO     Harolyn Rutherford 09/18/2013, 4:23 PM  Pager: [phone]

## 2013-09-18 NOTE — Clinical Social Work Placement (Signed)
Clinical Social Work Department CLINICAL SOCIAL WORK PLACEMENT NOTE 09/18/2013  Patient:  [name], [name]  Account Number:  [id number] Admit date:  09/15/2013  Clinical Social Worker:  Irving Burton [place], LCSWA  Date/time:  09/18/2013 03:21 PM  Clinical Social Work is seeking post-discharge placement for this patient at the following level of care:   SKILLED NURSING   (*CSW will update this form in Epic as items are completed)   09/18/2013  Patient/family provided with [facility] Department of Clinical Social Works list of facilities offering this level of care within the geographic area requested by the patient (or if unable, by the patients family).  09/18/2013  Patient/family informed of their freedom to choose among providers that offer the needed level of care, that participate in Medicare, Medicaid or managed care program needed by the patient, have an available bed and are willing to accept the patient.  09/18/2013  Patient/family informed of [facility] ownership interest in [facility], as well as of the fact that they are under no obligation to receive care at this facility.  PASARR submitted to EDS on 09/18/2013 PASARR number received from EDS on 09/18/2013  FL2 transmitted to all facilities in geographic area requested by pt/family on  09/18/2013 FL2 transmitted to all facilities within larger geographic area on   Patient informed that his/her managed care company has contracts with or will negotiate with  certain facilities, including the following:     Patient/family informed of bed offers received:   Patient chooses bed at  Physician recommends and patient chooses bed at    Patient to be transferred to  on   Patient to be transferred to facility by   The following physician request were entered in Epic:   Additional Comments:  Darlyn Chamber, Theresia Majors Clinical Social Worker [phone]

## 2013-09-19 ENCOUNTER — Inpatient Hospital Stay (HOSPITAL_COMMUNITY): Payer: Medicare Other

## 2013-09-19 LAB — CBC WITH DIFFERENTIAL/PLATELET
BASOS PCT: 0 % (ref 0–1)
Basophils Absolute: 0 10*3/uL (ref 0.0–0.1)
Eosinophils Absolute: 0 10*3/uL (ref 0.0–0.7)
Eosinophils Relative: 0 % (ref 0–5)
HCT: 30.3 % — ABNORMAL LOW (ref 36.0–46.0)
HEMOGLOBIN: 10.2 g/dL — AB (ref 12.0–15.0)
Lymphocytes Relative: 10 % — ABNORMAL LOW (ref 12–46)
Lymphs Abs: 1.2 10*3/uL (ref 0.7–4.0)
MCH: 31.5 pg (ref 26.0–34.0)
MCHC: 33.7 g/dL (ref 30.0–36.0)
MCV: 93.5 fL (ref 78.0–100.0)
MONOS PCT: 7 % (ref 3–12)
Monocytes Absolute: 0.8 10*3/uL (ref 0.1–1.0)
NEUTROS ABS: 9.7 10*3/uL — AB (ref 1.7–7.7)
NEUTROS PCT: 82 % — AB (ref 43–77)
Platelets: 110 10*3/uL — ABNORMAL LOW (ref 150–400)
RBC: 3.24 MIL/uL — ABNORMAL LOW (ref 3.87–5.11)
RDW: 16.1 % — ABNORMAL HIGH (ref 11.5–15.5)
WBC: 11.8 10*3/uL — ABNORMAL HIGH (ref 4.0–10.5)

## 2013-09-19 LAB — BASIC METABOLIC PANEL
BUN: 18 mg/dL (ref 6–23)
CALCIUM: 8.8 mg/dL (ref 8.4–10.5)
CO2: 21 mEq/L (ref 19–32)
Chloride: 108 mEq/L (ref 96–112)
Creatinine, Ser: 0.78 mg/dL (ref 0.50–1.10)
GFR, EST AFRICAN AMERICAN: 83 mL/min — AB (ref >90)
GFR, EST NON AFRICAN AMERICAN: 71 mL/min — AB (ref >90)
Glucose, Bld: 142 mg/dL — ABNORMAL HIGH (ref 70–99)
POTASSIUM: 3.4 meq/L — AB (ref 3.7–5.3)
Sodium: 141 mEq/L (ref 137–147)

## 2013-09-19 LAB — GLUCOSE, CAPILLARY
GLUCOSE-CAPILLARY: 130 mg/dL — AB (ref 70–99)
Glucose-Capillary: 174 mg/dL — ABNORMAL HIGH (ref 70–99)
Glucose-Capillary: 172 mg/dL — ABNORMAL HIGH (ref 70–99)
Glucose-Capillary: 193 mg/dL — ABNORMAL HIGH (ref 70–99)

## 2013-09-19 NOTE — Progress Notes (Addendum)
Clinical Child psychotherapist (CSW) attempted to contact Patient's grand-daughter to give bed offers to skilled nursing facilities. CSW left message with patient's grand-daughter Adrian Saran [phone].  Jetta Lout, LCSWA Weekend CSW [phone]   Grand-daughter called CSW back and got bed offers. Grand-daughter reported that she would go look at the facilities that have offered a bed. CSW reported to grand-daughter that patient will possible D/C Monday.   Jetta Lout, LCSWA Weekend CSW [phone]

## 2013-09-19 NOTE — Progress Notes (Signed)
Stroke Team Progress Note  HISTORY [name] is an 80 y.o. female who has known Afib and on Eliquis. Talking to her granddaughter she has not refilled her Elequis and she is sure she has not taken this medication since 09/03/13. Patient was her normal self this AM 09/15/2013 per son and was walking around. At 15:00 hours he noted she called his name and he noted she was not moving her right side and unable to answer his questions appropriately. On exam she was showing left arm flaccidity , left leg hemiparesis, left facial droop and right gaze preference. Patient was given TPA. She was taken for an angiogram which showed right MCA occlusion. Intervention was not attempted as it was anticipated to be technically challenging and family did not want her to receive general anesthesia.  She was admitted to the neuro ICU for further evaluation and treatment.  SUBJECTIVE Multiple family members at bedside. The patient has been sleepy which is probably secondary to the hemorrhage. Continue to hold aspirin. The possible skilled nursing facility placement Monday.  OBJECTIVE Most recent Vital Signs: Filed Vitals:   09/18/13 1837 09/18/13 2200 09/19/13 0644 09/19/13 1018  BP: 112/73 114/52 131/64 111/72  Pulse: 63 95 104 72  Temp: 99.6 F (37.6 C) 98 F (36.7 C) 97.3 F (36.3 C) 98.4 F (36.9 C)  TempSrc: Oral Oral Oral Oral  Resp: 18 18 22 24   Height:      Weight:      SpO2: 98% 100% 99% 100%   CBG (last 3)   Recent Labs  09/18/13 1618 09/18/13 2125 09/19/13 0641  GLUCAP 103* 152* 130*    IV Fluid Intake:   . sodium chloride 75 mL/hr (09/18/13 2046)  . sodium chloride 100 mL (09/18/13 1453)    MEDICATIONS  . allopurinol  100 mg Oral Daily  . brimonidine  1 drop Both Eyes Q12H   And  . timolol  1 drop Both Eyes Q12H  . brinzolamide  1 drop Both Eyes TID  . carvedilol  3.125 mg Oral Q breakfast  . cholecalciferol  400 Units Oral Daily  . glipiZIDE  5 mg Oral Daily  . isosorbide  mononitrate  30 mg Oral Daily  . latanoprost  1 drop Both Eyes QHS  . levothyroxine  125 mcg Oral Daily  . metFORMIN  1,000 mg Oral Q breakfast  . metFORMIN  500 mg Oral q1800  . methotrexate  10 mg Oral Weekly  . pantoprazole  40 mg Oral QHS  . prednisoLONE acetate  1 drop Right Eye BID  . ramipril  1.25 mg Oral Daily  . simvastatin  10 mg Oral q1800   PRN:  acetaminophen, acetaminophen, labetalol, nitroGLYCERIN, RESOURCE THICKENUP CLEAR, senna-docusate  Diet:  Dysphagia 1 honey thick liquids Activity:  Bedrest DVT Prophylaxis:  SCDs   CLINICALLY SIGNIFICANT STUDIES Basic Metabolic Panel:   Recent Labs Lab 09/15/13 1606 09/15/13 1612 09/19/13 0530  NA 145 143 141  K 4.4 4.2 3.4*  CL 108 109 108  CO2 23  --  21  GLUCOSE 157* 158* 142*  BUN 16 16 18   CREATININE 0.79 0.80 0.78  CALCIUM 9.8  --  8.8   Liver Function Tests:   Recent Labs Lab 09/15/13 1606  AST 14  ALT 8  ALKPHOS 99  BILITOT 0.4  PROT 6.8  ALBUMIN 3.7   CBC:   Recent Labs Lab 09/15/13 1606 09/15/13 1612 09/19/13 0530  WBC 5.7  --  11.8*  NEUTROABS  3.7  --  9.7*  HGB 11.3* 12.9 10.2*  HCT 33.9* 38.0 30.3*  MCV 97.1  --  93.5  PLT 111*  --  110*   Coagulation:   Recent Labs Lab 09/15/13 1606 09/16/13 0540  LABPROT 14.8 15.8*  INR 1.19 1.29   Cardiac Enzymes:   Recent Labs Lab 09/15/13 1607  TROPONINI <0.30   Urinalysis:   Recent Labs Lab 09/15/13 1709  COLORURINE YELLOW  LABSPEC 1.019  PHURINE 6.0  GLUCOSEU NEGATIVE  HGBUR SMALL*  BILIRUBINUR NEGATIVE  KETONESUR 15*  PROTEINUR NEGATIVE  UROBILINOGEN 1.0  NITRITE NEGATIVE  LEUKOCYTESUR NEGATIVE   Lipid Panel    Component Value Date/Time   CHOL 84 09/16/2013 0540   TRIG 67 09/16/2013 0540   HDL 38* 09/16/2013 0540   CHOLHDL 2.2 09/16/2013 0540   VLDL 13 09/16/2013 0540   LDLCALC 33 09/16/2013 0540   HgbA1C  Lab Results  Component Value Date   HGBA1C 6.5* 09/16/2013    Urine Drug Screen:     Component Value  Date/Time   LABOPIA NONE DETECTED 09/15/2013 1709   COCAINSCRNUR NONE DETECTED 09/15/2013 1709   LABBENZ NONE DETECTED 09/15/2013 1709   AMPHETMU NONE DETECTED 09/15/2013 1709   THCU NONE DETECTED 09/15/2013 1709   LABBARB NONE DETECTED 09/15/2013 1709    Alcohol Level:   Recent Labs Lab 09/15/13 1606  ETH <11     CT of the brain  09/15/2013    No intracranial hemorrhage.  Slightly hyperdense right carotid terminus and M1 segment of the right middle cerebral artery. Thrombus not excluded in proper clinical setting.  Right occipital white matter type changes may represent result of prior infarct although slightly atypical as noted above.  Mild atrophy.  The dens projects superiorly which may reflect cranial settling with mild encroachment on the cervical medullary junction.    CT of Brain 09/18/2013 IMPRESSION:  1. Similar size of hemorrhagic stroke involving the right lentiform  nucleus measuring 3.8 x 2.3 cm on today's study.  2. Increased vasogenic edema about the hemorrhagic stroke in the  right lentiform nucleus with slightly increased effacement of the  right lateral ventricle and slightly increased right-to-left midline  shift now measuring 3 mm. No hydrocephalus.  3. Subcentimeter hypodensity within the right cerebellar hemisphere,  compatible with previous identified ischemic infarct at this  location.  4. No new intracranial hemorrhage or infarct identified.  5. Remote right occipital lobe infarct, unchanged.  6. Advanced age-related atrophy and chronic microvascular ischemic  disease, unchanged.   Cerebral Angiogram  RT CFa approach.  RT MCA occlusion.  Extensive tortuosity of aortic arch.  MRI of the brain  09/16/2013 1. Hemorrhagic conversion of the right MCA infarct with a prominent hemorrhage involving the right lentiform nucleus in the 2nd hemorrhage involving the right caudate head.  2. Partial effacement of the right lateral ventricle and minimal midline shift. 3. Remote  encephalomalacia of the right occipital lobe with a a minimal amount of remote blood products. 4. 9 mm area of remote hemorrhage involving the high left parietal lobe. 5. Moderate generalized atrophy and white matter otherwise suggests chronic microvascular ischemia.   2D Echocardiogram    Carotid Doppler Findings suggest 1-39% internal carotid artery stenosis bilaterally. The right vertebral artery is patent with antegrade flow. Unable to visualize the left vertebral artery.  EKG  atrial fibrillation, rate 61.   Therapy Recommendations  SNF  Physical Exam   Frail elderly african american lady not in distress.Awake alert. Afebrile.  Head is nontraumatic. Neck is supple without bruit. . Cardiac exam no murmur or gallop. Lungs are clear to auscultation. Distal pulses are well felt. Neurological Exam : Awake alert globally aphasic. Speaks few words mostly nonsensical and short sentences. Will follow only occasional midline commands. Left gaze deviation has some spontaneous eye movements to the right but will not cross midline. Does not blink to threat on the right but does so on the left. Right lower facial weakness. Tongue midline. Motor system exam reveals dense right hemiplegia and will withdraw minimally right upper and lower extremity to pain only. Purposeful antigravity movements in the left side. Sensation diminished on the right. Right plantar is upgoing and left is downgoing. Gait was not tested.  ASSESSMENT [name] is a 80 y.o. female presenting with right hemiparesis and expressive aphasia. S/P IV tPA 09/15/2013 at 1649. Cerebral angio confirmed R MCA occlusion. She did not receive intervention due to vessel tortuosity and family refusal for general anaesthesia. Imaging confirms hemorrhagic conversion of a right MCA infarct involving the right lentiform nucleus and a 2nd hemorrhage involving the right caudate head.  Cytotoxic cerebral edema with minimal midline shift. Infarct felt to  be embolic secondary to known atrial fibrillation.  Post tPA hemorrhage asymptomatic. On apixaban, but not taking since 09/03/13 prior to admission. Now on no antithrombotics due to hemorrhagic transformation for secondary stroke prevention. Patient with resultant confusion, dysarthria, dense left hemiplegia, left HH, gaze deviation. Work up underway.   ? Baseline dementia Atrial fibrillation. Did not get Rx refilled for apixaban in Dec. Need to find out reasoning behind Diabetes, HgbA1c pending, goal < 7.0 CAD - stent 01/2012 CHF RA LDL 30 Old right occipital lobe infarct Old high left parietal hemorrhage  small vessel disease   Hospital day # 4  TREATMENT/PLAN  See repeat CT from 09/23/2013 as above.Continue to hold aspirin for now based on recent imaging. In the future if bleed isodense, will add aspirin for secondary stroke prevention. 1 week later will stop aspirin and resume anticoagulation.    Continue therapies  SNF recommended - possibly Monday.   Delton See PA-C [facility] Pager [phone] 09/19/2013, 11:29 AM  I have personally obtained a history, examined the patient, evaluated imaging results, and formulated the assessment and plan of care. I agree with the above. Delia Heady, MD

## 2013-09-20 LAB — GLUCOSE, CAPILLARY
GLUCOSE-CAPILLARY: 180 mg/dL — AB (ref 70–99)
Glucose-Capillary: 139 mg/dL — ABNORMAL HIGH (ref 70–99)
Glucose-Capillary: 142 mg/dL — ABNORMAL HIGH (ref 70–99)
Glucose-Capillary: 153 mg/dL — ABNORMAL HIGH (ref 70–99)

## 2013-09-20 NOTE — Progress Notes (Signed)
Stroke Team Progress Note  HISTORY [name] is a 80 y.o. female who has known Afib and was on Eliquis. Talking to her granddaughter she had not refilled her Elequis and she was sure she had not taken this medication since 09/03/13. Patient was her normal self the AM of 09/15/2013 per her son and was walking around. At 15:00 hours he noted she called his name and he noted she was not moving her right side. She was unable to answer his questions appropriately. On exam she was showing left arm flaccidity , left leg hemiparesis, left facial droop and right gaze preference. Patient was given TPA. She was taken for an angiogram which showed right MCA occlusion. Intervention was not attempted as it was anticipated to be technically challenging and family did not want her to receive general anesthesia.  She was admitted to the neuro ICU for further evaluation and treatment.  SUBJECTIVE Multiple family members present. Dr. Pearlean Brownie discussed skilled nursing facility placement.  OBJECTIVE Most recent Vital Signs: Filed Vitals:   09/19/13 2140 09/20/13 0155 09/20/13 0555 09/20/13 0953  BP: 119/62 130/57 130/66 144/61  Pulse: 93 73 71 67  Temp: 98.3 F (36.8 C) 98.3 F (36.8 C) 97.5 F (36.4 C) 97.3 F (36.3 C)  TempSrc: Oral Axillary Oral Oral  Resp: 20 22 20 20   Height:      Weight:      SpO2: 100% 100% 100% 100%   CBG (last 3)   Recent Labs  09/19/13 1629 09/19/13 2140 09/20/13 0642  GLUCAP 172* 193* 139*    IV Fluid Intake:   . sodium chloride 75 mL/hr at 09/19/13 2229  . sodium chloride 100 mL (09/18/13 1453)    MEDICATIONS  . allopurinol  100 mg Oral Daily  . brimonidine  1 drop Both Eyes Q12H   And  . timolol  1 drop Both Eyes Q12H  . brinzolamide  1 drop Both Eyes TID  . carvedilol  3.125 mg Oral Q breakfast  . cholecalciferol  400 Units Oral Daily  . glipiZIDE  5 mg Oral Daily  . isosorbide mononitrate  30 mg Oral Daily  . latanoprost  1 drop Both Eyes QHS  .  levothyroxine  125 mcg Oral Daily  . metFORMIN  1,000 mg Oral Q breakfast  . metFORMIN  500 mg Oral q1800  . methotrexate  10 mg Oral Weekly  . pantoprazole  40 mg Oral QHS  . prednisoLONE acetate  1 drop Right Eye BID  . ramipril  1.25 mg Oral Daily  . simvastatin  10 mg Oral q1800   PRN:  acetaminophen, acetaminophen, labetalol, nitroGLYCERIN, RESOURCE THICKENUP CLEAR, senna-docusate  Diet:  Dysphagia 1 pudding thick liquids Activity:  Bedrest - change to up with assistance DVT Prophylaxis:  SCDs   CLINICALLY SIGNIFICANT STUDIES Basic Metabolic Panel:   Recent Labs Lab 09/15/13 1606 09/15/13 1612 09/19/13 0530  NA 145 143 141  K 4.4 4.2 3.4*  CL 108 109 108  CO2 23  --  21  GLUCOSE 157* 158* 142*  BUN 16 16 18   CREATININE 0.79 0.80 0.78  CALCIUM 9.8  --  8.8   Liver Function Tests:   Recent Labs Lab 09/15/13 1606  AST 14  ALT 8  ALKPHOS 99  BILITOT 0.4  PROT 6.8  ALBUMIN 3.7   CBC:   Recent Labs Lab 09/15/13 1606 09/15/13 1612 09/19/13 0530  WBC 5.7  --  11.8*  NEUTROABS 3.7  --  9.7*  HGB 11.3* 12.9 10.2*  HCT 33.9* 38.0 30.3*  MCV 97.1  --  93.5  PLT 111*  --  110*   Coagulation:   Recent Labs Lab 09/15/13 1606 09/16/13 0540  LABPROT 14.8 15.8*  INR 1.19 1.29   Cardiac Enzymes:   Recent Labs Lab 09/15/13 1607  TROPONINI <0.30   Urinalysis:   Recent Labs Lab 09/15/13 1709  COLORURINE YELLOW  LABSPEC 1.019  PHURINE 6.0  GLUCOSEU NEGATIVE  HGBUR SMALL*  BILIRUBINUR NEGATIVE  KETONESUR 15*  PROTEINUR NEGATIVE  UROBILINOGEN 1.0  NITRITE NEGATIVE  LEUKOCYTESUR NEGATIVE   Lipid Panel    Component Value Date/Time   CHOL 84 09/16/2013 0540   TRIG 67 09/16/2013 0540   HDL 38* 09/16/2013 0540   CHOLHDL 2.2 09/16/2013 0540   VLDL 13 09/16/2013 0540   LDLCALC 33 09/16/2013 0540   HgbA1C  Lab Results  Component Value Date   HGBA1C 6.5* 09/16/2013    Urine Drug Screen:     Component Value Date/Time   LABOPIA NONE DETECTED 09/15/2013  1709   COCAINSCRNUR NONE DETECTED 09/15/2013 1709   LABBENZ NONE DETECTED 09/15/2013 1709   AMPHETMU NONE DETECTED 09/15/2013 1709   THCU NONE DETECTED 09/15/2013 1709   LABBARB NONE DETECTED 09/15/2013 1709    Alcohol Level:   Recent Labs Lab 09/15/13 1606  ETH <11     CT of the brain   09/15/2013     No intracranial hemorrhage.  Slightly hyperdense right carotid terminus and M1 segment of the right middle cerebral artery. Thrombus not excluded in proper clinical setting.  Right occipital white matter type changes may represent result of prior infarct although slightly atypical as noted above.  Mild atrophy.  The dens projects superiorly which may reflect cranial settling with mild encroachment on the cervical medullary junction.    CT of Brain 09/19/2013 IMPRESSION:  1. Similar size of hemorrhagic stroke involving the right lentiform nucleus measuring 3.8 x 2.3 cm on today's study.  2. Increased vasogenic edema about the hemorrhagic stroke in the right lentiform nucleus with slightly increased effacement of the  right lateral ventricle and slightly increased right-to-left midline shift now measuring 3 mm. No hydrocephalus.  3. Subcentimeter hypodensity within the right cerebellar hemisphere, compatible with previous identified ischemic infarct at this location.  4. No new intracranial hemorrhage or infarct identified.  5. Remote right occipital lobe infarct, unchanged.  6. Advanced age-related atrophy and chronic microvascular ischemic disease, unchanged.   Cerebral Angiogram   09/15/2013 RT MCA occlusion. Extensive tortuosity of aortic arch.  MRI of the brain   09/16/2013  1. Hemorrhagic conversion of the right MCA infarct with a prominent hemorrhage involving the right lentiform nucleus in the 2nd hemorrhage involving the right caudate head.  2. Partial effacement of the right lateral ventricle and minimal midline shift. 3. Remote encephalomalacia of the right occipital lobe with a a  minimal amount of remote blood products. 4. 9 mm area of remote hemorrhage involving the high left parietal lobe. 5. Moderate generalized atrophy and white matter otherwise suggests chronic microvascular ischemia.   2D Echocardiogram  Ejection fraction 30-35%. No cardiac source of emboli identified.  Carotid Doppler Findings suggest 1-39% internal carotid artery stenosis bilaterally. The right vertebral artery is patent with antegrade flow. Unable to visualize the left vertebral artery.  EKG  atrial fibrillation, rate 61.   Therapy Recommendations  SNF  Physical Exam   Frail elderly african american lady not in distress.Awake alert. Afebrile. Head is nontraumatic.  Neck is supple without bruit. . Cardiac exam no murmur or gallop. Lungs are clear to auscultation. Distal pulses are well felt. Neurological Exam : Awake alert globally aphasic. Speaks few words mostly nonsensical and short sentences. Will follow only occasional midline commands. Left gaze deviation has some spontaneous eye movements to the right but will not cross midline. Does not blink to threat on the right but does so on the left. Right lower facial weakness. Tongue midline. Motor system exam reveals dense right hemiplegia and will withdraw minimally right upper and lower extremity to pain only. Purposeful antigravity movements in the left side. Sensation diminished on the right. Right plantar is upgoing and left is downgoing. Gait was not tested.  ASSESSMENT [name] is a 80 y.o. female presenting with right hemiparesis and expressive aphasia. S/P IV tPA 09/15/2013 at 1649. Cerebral angio confirmed R MCA occlusion. She did not receive intervention due to vessel tortuosity and family refusal for general anaesthesia. Imaging confirms hemorrhagic conversion of a right MCA infarct involving the right lentiform nucleus and a 2nd hemorrhage involving the right caudate head.  Cytotoxic cerebral edema with minimal midline shift.  Infarct felt to be embolic secondary to known atrial fibrillation.  Post tPA hemorrhage asymptomatic. On apixaban, but not taking since 09/03/13 prior to admission. Now on no antithrombotics due to hemorrhagic transformation for secondary stroke prevention. Patient with resultant confusion, dysarthria, dense left hemiplegia, left HH, gaze deviation. Work up underway.   ? Baseline dementia Atrial fibrillation. Did not get Rx refilled for apixaban in Dec. Need to find out reasoning behind Diabetes, HgbA1c pending, goal < 7.0 CAD - stent 01/2012 CHF - ejection fraction of 30-35% by 2-D echo. RA LDL 30 Old right occipital lobe infarct Old high left parietal hemorrhage  small vessel disease   Hospital day # 5  TREATMENT/PLAN  See repeat CT from 09/19/2013 as above.Continue to hold aspirin for now based on recent imaging. In the future if bleed isodense, will add aspirin for secondary stroke prevention. 1 week later will stop aspirin and resume anticoagulation.   Continue therapies  SNF recommended - possibly Monday.   Delton See PA-C [facility] Pager [phone] 09/20/2013, 11:03 AM  I have personally obtained a history, examined the patient, evaluated imaging results, and formulated the assessment and plan of care. I agree with the above. Delia Heady, MD

## 2013-09-21 LAB — GLUCOSE, CAPILLARY
Glucose-Capillary: 158 mg/dL — ABNORMAL HIGH (ref 70–99)
Glucose-Capillary: 186 mg/dL — ABNORMAL HIGH (ref 70–99)
Glucose-Capillary: 157 mg/dL — ABNORMAL HIGH (ref 70–99)

## 2013-09-21 MED ORDER — CHOLECALCIFEROL 10 MCG (400 UNIT) PO TABS: 400.0000 [IU] | ORAL_TABLET | Freq: Every day | ORAL | Status: DC

## 2013-09-21 MED ORDER — SENNOSIDES-DOCUSATE SODIUM 8.6-50 MG PO TABS: 1.0000 | ORAL_TABLET | Freq: Every evening | ORAL | Status: DC | PRN

## 2013-09-21 MED ORDER — RESOURCE THICKENUP CLEAR PO POWD: 1.0000 | ORAL | Status: AC | PRN

## 2013-09-21 NOTE — Progress Notes (Signed)
Stroke Team Progress Note  HISTORY [name] is a 80 y.o. female who has known Afib and was on Eliquis. Talking to her granddaughter she had not refilled her Elequis and she was sure she had not taken this medication since 09/03/13. Patient was her normal self the AM of 09/15/2013 per her son and was walking around. At 15:00 hours he noted she called his name and he noted she was not moving her right side. She was unable to answer his questions appropriately. On exam she was showing left arm flaccidity , left leg hemiparesis, left facial droop and right gaze preference. Patient was given TPA. She was taken for an angiogram which showed right MCA occlusion. Intervention was not attempted as it was anticipated to be technically challenging and family did not want her to receive general anesthesia.  She was admitted to the neuro ICU for further evaluation and treatment.  SUBJECTIVE Her granddaughter is at bedside. She is not agreeable to the discharge plan for SNF placement. She wants to take the pt home. Her mother is at the beside, agreeable, and plans to help. Granddaughter states she has the power of attorney and was not present during yesterday's conversation with the family related to a skilled nursing facility placement.  OBJECTIVE Most recent Vital Signs: Filed Vitals:   09/20/13 2223 09/21/13 0148 09/21/13 0635 09/21/13 1012  BP: 135/73 148/71 154/70 144/76  Pulse: 76 79 77 83  Temp: 97.7 F (36.5 C) 98.1 F (36.7 C) 97.3 F (36.3 C) 98.2 F (36.8 C)  TempSrc: Oral Oral Oral Oral  Resp: 22 20 22 20   Height:      Weight:      SpO2: 100% 100% 100% 98%   CBG (last 3)   Recent Labs  09/20/13 1705 09/20/13 2222 09/21/13 0634  GLUCAP 142* 153* 158*   IV Fluid Intake:   . sodium chloride 75 mL/hr at 09/19/13 2229  . sodium chloride 100 mL (09/18/13 1453)    MEDICATIONS  . allopurinol  100 mg Oral Daily  . brimonidine  1 drop Both Eyes Q12H   And  . timolol  1 drop Both  Eyes Q12H  . brinzolamide  1 drop Both Eyes TID  . carvedilol  3.125 mg Oral Q breakfast  . cholecalciferol  400 Units Oral Daily  . glipiZIDE  5 mg Oral Daily  . isosorbide mononitrate  30 mg Oral Daily  . latanoprost  1 drop Both Eyes QHS  . levothyroxine  125 mcg Oral Daily  . metFORMIN  1,000 mg Oral Q breakfast  . metFORMIN  500 mg Oral q1800  . methotrexate  10 mg Oral Weekly  . pantoprazole  40 mg Oral QHS  . prednisoLONE acetate  1 drop Right Eye BID  . ramipril  1.25 mg Oral Daily  . simvastatin  10 mg Oral q1800   PRN:  acetaminophen, acetaminophen, labetalol, nitroGLYCERIN, RESOURCE THICKENUP CLEAR, senna-docusate  Diet:  Dysphagia 1 pudding thick liquids Activity:  up with assistance DVT Prophylaxis:  SCDs   CLINICALLY SIGNIFICANT STUDIES Basic Metabolic Panel:   Recent Labs Lab 09/15/13 1606 09/15/13 1612 09/19/13 0530  NA 145 143 141  K 4.4 4.2 3.4*  CL 108 109 108  CO2 23  --  21  GLUCOSE 157* 158* 142*  BUN 16 16 18   CREATININE 0.79 0.80 0.78  CALCIUM 9.8  --  8.8   Liver Function Tests:   Recent Labs Lab 09/15/13 1606  AST 14  ALT 8  ALKPHOS 99  BILITOT 0.4  PROT 6.8  ALBUMIN 3.7   CBC:   Recent Labs Lab 09/15/13 1606 09/15/13 1612 09/19/13 0530  WBC 5.7  --  11.8*  NEUTROABS 3.7  --  9.7*  HGB 11.3* 12.9 10.2*  HCT 33.9* 38.0 30.3*  MCV 97.1  --  93.5  PLT 111*  --  110*   Coagulation:   Recent Labs Lab 09/15/13 1606 09/16/13 0540  LABPROT 14.8 15.8*  INR 1.19 1.29   Cardiac Enzymes:   Recent Labs Lab 09/15/13 1607  TROPONINI <0.30   Urinalysis:   Recent Labs Lab 09/15/13 1709  COLORURINE YELLOW  LABSPEC 1.019  PHURINE 6.0  GLUCOSEU NEGATIVE  HGBUR SMALL*  BILIRUBINUR NEGATIVE  KETONESUR 15*  PROTEINUR NEGATIVE  UROBILINOGEN 1.0  NITRITE NEGATIVE  LEUKOCYTESUR NEGATIVE   Lipid Panel    Component Value Date/Time   CHOL 84 09/16/2013 0540   TRIG 67 09/16/2013 0540   HDL 38* 09/16/2013 0540   CHOLHDL 2.2  09/16/2013 0540   VLDL 13 09/16/2013 0540   LDLCALC 33 09/16/2013 0540   HgbA1C  Lab Results  Component Value Date   HGBA1C 6.5* 09/16/2013    Urine Drug Screen:     Component Value Date/Time   LABOPIA NONE DETECTED 09/15/2013 1709   COCAINSCRNUR NONE DETECTED 09/15/2013 1709   LABBENZ NONE DETECTED 09/15/2013 1709   AMPHETMU NONE DETECTED 09/15/2013 1709   THCU NONE DETECTED 09/15/2013 1709   LABBARB NONE DETECTED 09/15/2013 1709    Alcohol Level:   Recent Labs Lab 09/15/13 1606  ETH <11     CT of the brain   09/19/2013  1. Similar size of hemorrhagic stroke involving the right lentiform nucleus measuring 3.8 x 2.3 cm on today's study. 2. Increased vasogenic edema about the hemorrhagic stroke in the right lentiform nucleus with slightly increased effacement of the right lateral ventricle and slightly increased right-to-left midline shift now measuring 3 mm. No hydrocephalus. 3. Subcentimeter hypodensity within the right cerebellar hemisphere, compatible with previous identified ischemic infarct at this location. 4. No new intracranial hemorrhage or infarct identified. 5. Remote right occipital lobe infarct, unchanged. 6. Advanced age-related atrophy and chronic microvascular ischemic disease, unchanged. 09/15/2013    No intracranial hemorrhage.  Slightly hyperdense right carotid terminus and M1 segment of the right middle cerebral artery. Thrombus not excluded in proper clinical setting.  Right occipital white matter type changes may represent result of prior infarct although slightly atypical as noted above.  Mild atrophy.  The dens projects superiorly which may reflect cranial settling with mild encroachment on the cervical medullary junction.    Cerebral Angiogram  09/15/2013 RT MCA occlusion. Extensive tortuosity of aortic arch.  MRI of the brain  09/16/2013 1. Hemorrhagic conversion of the right MCA infarct with a prominent hemorrhage involving the right lentiform nucleus in the 2nd hemorrhage  involving the right caudate head.  2. Partial effacement of the right lateral ventricle and minimal midline shift. 3. Remote encephalomalacia of the right occipital lobe with a a minimal amount of remote blood products. 4. 9 mm area of remote hemorrhage involving the high left parietal lobe. 5. Moderate generalized atrophy and white matter otherwise suggests chronic microvascular ischemia.   2D Echocardiogram  Ejection fraction 30-35%. No cardiac source of emboli identified.  Carotid Doppler Findings suggest 1-39% internal carotid artery stenosis bilaterally. The right vertebral artery is patent with antegrade flow. Unable to visualize the left vertebral artery.  EKG  atrial  fibrillation, rate 61.   Therapy Recommendations  SNF  Physical Exam   Frail elderly african american lady not in distress.Awake alert. Afebrile. Head is nontraumatic. Neck is supple without bruit. . Cardiac exam no murmur or gallop. Lungs are clear to auscultation. Distal pulses are well felt. Neurological Exam : Awake alert globally aphasic. Speaks few words mostly nonsensical and short sentences. Will follow only occasional midline commands. Left gaze deviation has some spontaneous eye movements to the right but will not cross midline. Does not blink to threat on the right but does so on the left. Right lower facial weakness. Tongue midline. Motor system exam reveals dense right hemiplegia and will withdraw minimally right upper and lower extremity to pain only. Purposeful antigravity movements in the left side. Sensation diminished on the right. Right plantar is upgoing and left is downgoing. Gait was not tested.  ASSESSMENT Ms. [name] is a 80 y.o. female presenting with right hemiparesis and expressive aphasia. S/P IV tPA 09/15/2013 at 1649. Cerebral angio confirmed R MCA occlusion. She did not receive intervention due to vessel tortuosity and family refusal for general anaesthesia. Imaging confirms hemorrhagic  conversion of a right MCA infarct involving the right lentiform nucleus and a 2nd hemorrhage involving the right caudate head.  Cytotoxic cerebral edema with minimal midline shift. Infarct felt to be embolic secondary to known atrial fibrillation.  Post tPA hemorrhage asymptomatic. On apixaban, but not taking since 09/03/13 prior to admission. Now on no antithrombotics due to hemorrhagic transformation for secondary stroke prevention. Patient with resultant confusion, dysarthria, dense left hemiplegia, left HH, gaze deviation. Work up completed.   ? Baseline dementia Atrial fibrillation. Did not get Rx refilled for apixaban in Dec.  Diabetes, HgbA1c6.5 at goal < 7.0 CAD - stent 01/2012 CHF - ejection fraction of 30-35% by 2-D echo. RA LDL 30 Old right occipital lobe infarct Old high left parietal hemorrhage  small vessel disease   Hospital day # 6  TREATMENT/PLAN  Continue to hold antithrombotics due to hemorrhage. Once  isodense, will add aspirin for secondary stroke prevention. 1 week later will stop aspirin and resume anticoagulation.   Too low level for CIR. Medically ready for discharge to SNF  -  Family do not want SNF placement, but want to take home.  Home Health:   PT, OT, speech for language and swallowing, RN and nursing assistant, hospital bed, 3:1, wheel chair  Discharge home today. Transport via [employer] per SW  Annie Main, MSN, RN, ANVP-BC, ANP-BC, [facility] Pager: [phone] 09/21/2013 10:19 AM  I have personally obtained a history, examined the patient, evaluated imaging results, and formulated the assessment and plan of care. I agree with the above. Delia Heady, MD

## 2013-09-21 NOTE — Clinical Social Work Note (Signed)
CSW received call from pt's RN stating that pt's family had changed their mind regarding pt's discharge disposition and would now like for pt to be discharged home with home health services. RN stated that pt would need ambulance transportation at time of discharge. CSW called pt's granddaughter Adrian Saran, [phone]) to confirm address pt will be discharging to. Pt's granddaughter informed CSW that pt will be discharged to:  [address] [place], [place] [address]  Pt's granddaughter informed CSW that pt can not be discharged until DME has arrived at pt's discharge location. CSW left message for [facility] regarding pt's granddaughters concern. CSW has completed ambulance transportation form and placed on pt's shadow chart. RN to inform CSW when pt is ready for ambulance transportation to be called.  [facility] (ambulance transportation) [phone]  Darlyn Chamber, [facility] Clinical Social Worker [phone]

## 2013-09-21 NOTE — Progress Notes (Signed)
Discharge instructions and s/sx of stroke d/w Adrian Saran by phone. Per Merrilyn Puma, [facility] just arrived with equipment. Will call [facility] at this time.

## 2013-09-21 NOTE — Progress Notes (Signed)
Talked to patient's daughter in law and grand daughter about DCP; plan is for patient to go home at discharge; patient is to stay with her family and they will provide 24hr care; Home Health Care choices offered, granddaughter chose [facility]; [facility] with [facility] called for arrangements; Darian with [facility] notified of DME - hospital bed, 3:1, wheel chair; for discharge home today; patient is to be discharged home via ambulance; Abelino Derrick RN,BSN,MHA [phone]

## 2013-09-21 NOTE — Progress Notes (Signed)
[facility] [phone]) called for delivery time of equipment; equipment marked urgent; they will contact the granddaughter for arrangements; Abelino Derrick RN,BSN,MHA [phone]

## 2013-09-21 NOTE — Progress Notes (Signed)
Noted plans to d/c home. We will sign off. [phone]

## 2013-09-21 NOTE — Progress Notes (Signed)
Physical Therapy Treatment Patient Details Name: ASENCION GUISINGER MRN: [id number] DOB: [birth year] Today's Date: 09/21/2013 Time: 9629-5284 PT Time Calculation (min): 22 min  PT Assessment / Plan / Recommendation  History of Present Illness 80 y.o. female who has known Afib and on Eliquis.  Talking to her granddaughter she has not refilled her Elequis and she is sure she has not taken this medication since 09/03/13.  Patient was her normal self this AM per son and was walking around.  At 15:00 hours he noted she called his name and he noted she was not moving her right side and unable to answer his questions appropriately. On exam she was showing left arm flaccidity , left leg hemiparesis, left facial droop and right gaze preference.    PT Comments   Patient not progressing with mobility, appears slightly agitated today.  Follow Up Recommendations  SNF;Supervision/Assistance - 24 hour           Equipment Recommendations  3in1 (PT);Wheelchair (measurements PT);Hospital bed       Frequency Min 3X/week   Progress towards PT Goals Progress towards PT goals: Not progressing toward goals - comment  Plan Current plan remains appropriate;Frequency needs to be updated    Precautions / Restrictions Precautions Precautions: Fall Precaution Comments: L sided neglect Restrictions Weight Bearing Restrictions: No   Pertinent Vitals/Pain No value given, some pain with wincing during mobility    Mobility  Bed Mobility Overal bed mobility: Needs Assistance;+2 for physical assistance Bed Mobility: Rolling;Supine to Sit;Sit to Supine Rolling: Max assist Supine to sit: +2 for physical assistance;Total assist Sit to supine: Total assist;+2 for physical assistance General bed mobility comments: Continues to remain full assist, minimal initiation of movement on command, could not reach EOB secondary to patient unwillingness at this time Transfers Overall transfer level:  (no assesssed dur to patient  fatigue and agitation with mvt)      PT Goals (current goals can now be found in the care plan section) Acute Rehab PT Goals Patient Stated Goal: did not state PT Goal Formulation: Patient unable to participate in goal setting Time For Goal Achievement: 09/30/13 Potential to Achieve Goals: Fair  Visit Information  Last PT Received On: 09/21/13 Assistance Needed: +2 History of Present Illness: 80 y.o. female who has known Afib and on Eliquis.  Talking to her granddaughter she has not refilled her Elequis and she is sure she has not taken this medication since 09/03/13.  Patient was her normal self this AM per son and was walking around.  At 15:00 hours he noted she called his name and he noted she was not moving her right side and unable to answer his questions appropriately. On exam she was showing left arm flaccidity , left leg hemiparesis, left facial droop and right gaze preference.     Subjective Data  Subjective: pt anxious during session, continues to repeat that she is not wet Patient Stated Goal: did not state   Cognition  Cognition Arousal/Alertness: Lethargic Behavior During Therapy: Flat affect Overall Cognitive Status: Impaired/Different from baseline Area of Impairment: Problem solving;Attention Current Attention Level:  (aroused to focused) Problem Solving: Slow processing General Comments: Pt appears more agitated during session today    Balance  Balance Overall balance assessment: Needs assistance Sitting balance-Leahy Scale: Zero  End of Session PT - End of Session Equipment Utilized During Treatment: Gait belt Activity Tolerance: Patient limited by lethargy Patient left: in bed;with call bell/phone within reach;with bed alarm set;with family/visitor present Nurse Communication: Mobility  status;Precautions;Need for lift equipment   GP     [name] 09/21/2013, 12:29 PM [name], PT DPT  [phone]

## 2013-09-21 NOTE — Discharge Summary (Signed)
Stroke Discharge Summary  Patient ID: [name]   MRN: [id number]      DOB: 1921/02/06  Date of Admission: 09/15/2013 Date of Discharge: 09/21/2013  Attending Physician:  Darcella Cheshire, MD, Stroke MD  Consulting Physician(s):      Claudette Laws, MD (Physical Medicine & Rehabtilitation)  Patient's PCP:  Milana Obey, MD  Discharge Diagnoses:  Principal Problem:   Stroke - asymptomatic hemorrhagic conversion of a right lentiform nucleus infarct s/p IV tPA, infarct embolic secondary to atrial fibrillation  Active Problems:    2nd hemorrhage involving the right caudate head along with cytotoxic cerebral edema with minimal midline shift.     Type II or unspecified type diabetes mellitus without mention of complication, not stated as uncontrolled    CAD - stent 01/2012     Old right occipital lobe infarct     Old high left parietal hemorrhage      small vessel disease     ? Baseline dementia    CHF - ejection fraction of 30-35% by 2-D echo.     Rheumatoid Arthritis BMI: Body mass index is 21.51 kg/(m^2).  Past Medical History  Diagnosis Date  . Diabetes mellitus   . Glaucoma   . A-fib   . Myocardial infarction     3.5x74mm Veriflex stent 02/03/2012  . Coronary artery disease   . Hypothyroidism   . CHF (congestive heart failure)   . Arthritis     RA   Past Surgical History  Procedure Laterality Date  . Eye surgery    . Cardiac catheterization  02/03/12  . Tonsillectomy        Medication List    STOP taking these medications       ELIQUIS 5 MG Tabs tablet  Generic drug:  apixaban      TAKE these medications       allopurinol 100 MG tablet  Commonly known as:  ZYLOPRIM  Take 100 mg by mouth daily.     bimatoprost 0.03 % ophthalmic solution  Commonly known as:  LUMIGAN  Place 1 drop into both eyes at bedtime.     brinzolamide 1 % ophthalmic suspension  Commonly known as:  AZOPT  Place 1 drop into both eyes 3 (three) times daily.     carvedilol 3.125 MG tablet  Commonly known as:  COREG  Take 3.125 mg by mouth daily with breakfast.     cholecalciferol 400 UNITS Tabs tablet  Commonly known as:  VITAMIN D  Take 1 tablet (400 Units total) by mouth daily.     COMBIGAN 0.2-0.5 % ophthalmic solution  Generic drug:  brimonidine-timolol  Place 1 drop into both eyes every 12 (twelve) hours.     glipiZIDE 5 MG 24 hr tablet  Commonly known as:  GLUCOTROL XL  Take 5 mg by mouth daily.     isosorbide mononitrate 30 MG 24 hr tablet  Commonly known as:  IMDUR  Take 30 mg by mouth daily.     levothyroxine 125 MCG tablet  Commonly known as:  SYNTHROID, LEVOTHROID  Take 125 mcg by mouth daily.     metFORMIN 500 MG tablet  Commonly known as:  GLUCOPHAGE  Take 500 mg by mouth 2 (two) times daily with a meal. Take 2 tablets(1,000mg ) in the morning and 1 tablet (500mg ) at night     methotrexate 2.5 MG tablet  Commonly known as:  RHEUMATREX  - Take 10 mg by mouth once a week. Caution:Chemotherapy.  Protect from light.  - On Saturdays     nitroGLYCERIN 0.4 MG SL tablet  Commonly known as:  NITROSTAT  Place 0.4 mg under the tongue every 5 (five) minutes as needed for chest pain.     pravastatin 20 MG tablet  Commonly known as:  PRAVACHOL  Take 20 mg by mouth every evening.     prednisoLONE acetate 1 % ophthalmic suspension  Commonly known as:  PRED FORTE  Place 1 drop into the right eye 2 (two) times daily.     ramipril 1.25 MG capsule  Commonly known as:  ALTACE  Take 1.25 mg by mouth daily.     RESOURCE THICKENUP CLEAR Powd  Take 120 g by mouth as needed (thicken liquids to nectar thick consistency).     senna-docusate 8.6-50 MG per tablet  Commonly known as:  Senokot-S  Take 1 tablet by mouth at bedtime as needed for mild constipation.     vitamin B-12 1000 MCG tablet  Commonly known as:  CYANOCOBALAMIN  Take 1,000 mcg by mouth daily.        LABORATORY STUDIES CBC    Component Value Date/Time   WBC  11.8* 09/19/2013 0530   RBC 3.24* 09/19/2013 0530   HGB 10.2* 09/19/2013 0530   HCT 30.3* 09/19/2013 0530   PLT 110* 09/19/2013 0530   MCV 93.5 09/19/2013 0530   MCH 31.5 09/19/2013 0530   MCHC 33.7 09/19/2013 0530   RDW 16.1* 09/19/2013 0530   LYMPHSABS 1.2 09/19/2013 0530   MONOABS 0.8 09/19/2013 0530   EOSABS 0.0 09/19/2013 0530   BASOSABS 0.0 09/19/2013 0530   CMP    Component Value Date/Time   NA 141 09/19/2013 0530   K 3.4* 09/19/2013 0530   CL 108 09/19/2013 0530   CO2 21 09/19/2013 0530   GLUCOSE 142* 09/19/2013 0530   BUN 18 09/19/2013 0530   CREATININE 0.78 09/19/2013 0530   CALCIUM 8.8 09/19/2013 0530   PROT 6.8 09/15/2013 1606   ALBUMIN 3.7 09/15/2013 1606   AST 14 09/15/2013 1606   ALT 8 09/15/2013 1606   ALKPHOS 99 09/15/2013 1606   BILITOT 0.4 09/15/2013 1606   GFRNONAA 71* 09/19/2013 0530   GFRAA 83* 09/19/2013 0530   COAGS Lab Results  Component Value Date   INR 1.29 09/16/2013   INR 1.19 09/15/2013   INR 1.29 03/15/2013   Lipid Panel    Component Value Date/Time   CHOL 84 09/16/2013 0540   TRIG 67 09/16/2013 0540   HDL 38* 09/16/2013 0540   CHOLHDL 2.2 09/16/2013 0540   VLDL 13 09/16/2013 0540   LDLCALC 33 09/16/2013 0540   HgbA1C  Lab Results  Component Value Date   HGBA1C 6.5* 09/16/2013   Cardiac Panel (last 3 results) No results found for this basename: CKTOTAL, CKMB, TROPONINI, RELINDX,  in the last 72 hours Urinalysis    Component Value Date/Time   COLORURINE YELLOW 09/15/2013 1709   APPEARANCEUR CLEAR 09/15/2013 1709   LABSPEC 1.019 09/15/2013 1709   PHURINE 6.0 09/15/2013 1709   GLUCOSEU NEGATIVE 09/15/2013 1709   HGBUR SMALL* 09/15/2013 1709   BILIRUBINUR NEGATIVE 09/15/2013 1709   KETONESUR 15* 09/15/2013 1709   PROTEINUR NEGATIVE 09/15/2013 1709   UROBILINOGEN 1.0 09/15/2013 1709   NITRITE NEGATIVE 09/15/2013 1709   LEUKOCYTESUR NEGATIVE 09/15/2013 1709   Urine Drug Screen     Component Value Date/Time   LABOPIA NONE DETECTED 09/15/2013 1709   COCAINSCRNUR NONE DETECTED 09/15/2013 1709    LABBENZ NONE  DETECTED 09/15/2013 1709   AMPHETMU NONE DETECTED 09/15/2013 1709   THCU NONE DETECTED 09/15/2013 1709   LABBARB NONE DETECTED 09/15/2013 1709    Alcohol Level    Component Value Date/Time   ETH <11 09/15/2013 1606    SIGNIFICANT DIAGNOSTIC STUDIES CT of the brain  09/19/2013 1. Similar size of hemorrhagic stroke involving the right lentiform nucleus measuring 3.8 x 2.3 cm on today's study. 2. Increased vasogenic edema about the hemorrhagic stroke in the right lentiform nucleus with slightly increased effacement of the right lateral ventricle and slightly increased right-to-left midline shift now measuring 3 mm. No hydrocephalus. 3. Subcentimeter hypodensity within the right cerebellar hemisphere, compatible with previous identified ischemic infarct at this location. 4. No new intracranial hemorrhage or infarct identified. 5. Remote right occipital lobe infarct, unchanged. 6. Advanced age-related atrophy and chronic microvascular ischemic disease, unchanged.  09/15/2013 No intracranial hemorrhage. Slightly hyperdense right carotid terminus and M1 segment of the right middle cerebral artery. Thrombus not excluded in proper clinical setting. Right occipital white matter type changes may represent result of prior infarct although slightly atypical as noted above. Mild atrophy. The dens projects superiorly which may reflect cranial settling with mild encroachment on the cervical medullary junction.  Cerebral Angiogram 09/15/2013 RT MCA occlusion. Extensive tortuosity of aortic arch.  MRI of the brain 09/16/2013 1. Hemorrhagic conversion of the right MCA infarct with a prominent hemorrhage involving the right lentiform nucleus in the 2nd hemorrhage involving the right caudate head. 2. Partial effacement of the right lateral ventricle and minimal midline shift. 3. Remote encephalomalacia of the right occipital lobe with a a minimal amount of remote blood products. 4. 9 mm area of remote hemorrhage involving  the high left parietal lobe. 5. Moderate generalized atrophy and white matter otherwise suggests chronic microvascular ischemia.  2D Echocardiogram Ejection fraction 30-35%. No cardiac source of emboli identified.  Carotid Doppler Findings suggest 1-39% internal carotid artery stenosis bilaterally. The right vertebral artery is patent with antegrade flow. Unable to visualize the left vertebral artery.  EKG atrial fibrillation, rate 61.      History of Present Illness   [name] is a 80 y.o. female who has known Afib and was on Eliquis. Talking to her granddaughter she had not refilled her Elequis and she was sure she had not taken this medication since 09/03/13. Patient was her normal self the AM of 09/15/2013 per her son and was walking around. At 15:00 hours he noted she called his name and he noted she was not moving her right side. She was unable to answer his questions appropriately. On exam she was showing left arm flaccidity, left leg hemiparesis, left facial droop and right gaze preference. Patient was given TPA. She was taken for an angiogram which showed right MCA occlusion. Intervention was not attempted as it was anticipated to be technically challenging and family did not want her to receive general anesthesia. She was admitted to the neuro ICU for further evaluation and treatment.  Hospital Course Patient tolerated tPA administration without complication. Imaging at 24 hours does reveal hemorrhagic conversion of a right MCA infarct involving the right lentiform nucleus and a 2nd hemorrhage involving the right caudate head along with cytotoxic cerebral edema with minimal midline shift. Infarct felt to be embolic secondary to known atrial fibrillation. Post tPA hemorrhage asymptomatic.  She was prescribed apixaban prior to admission, but not taking since 09/03/13. She is now off antithrombotics due to hemorrhagic transformation for secondary stroke prevention. Once  blood is isodense per CT,  will add low dose aspirin for secondary stroke prevention. 1 week later, will stop aspirin and resume anticoagulation.   Patient with vascular risk factors of:  Atrial fibrillation. Did not get Rx refilled for apixaban in Dec.  Diabetes, HgbA1c6.5 at goal < 7.0  CAD - stent 01/2012  Old right occipital lobe infarct  Old high left parietal hemorrhage  small vessel disease   Patient also with: ? Baseline dementia CHF - ejection fraction of 30-35% by 2-D echo.  RA   Patient with continued stroke symptoms of confusion, dysarthria, dense left hemiplegia, left HH, gaze deviation. Physical therapy, occupational therapy and speech therapy evaluated patient. They recommended SNF; too low level for CIR. Family does not want SNF placement, but want to take home. Home Health: PT, OT, speech for language and swallowing, RN and nursing assistant, as well as hospital bed, 3:1, wheel chair arranged for home. Pt was transported home via [facility] .   Discharge Exam  Blood pressure 108/51, pulse 82, temperature 97.4 F (36.3 C), temperature source Oral, resp. rate 20, height 5\' 4"  (1.626 m), weight 56.881 kg (125 lb 6.4 oz), SpO2 100.00%.  Frail elderly african american lady not in distress.Awake alert. Afebrile. Head is nontraumatic. Neck is supple without bruit. . Cardiac exam no murmur or gallop. Lungs are clear to auscultation. Distal pulses are well felt.  Neurological Exam : Awake alert globally aphasic. Speaks few words mostly nonsensical and short sentences. Will follow only occasional midline commands. Left gaze deviation has some spontaneous eye movements to the right but will not cross midline. Does not blink to threat on the right but does so on the left. Right lower facial weakness. Tongue midline. Motor system exam reveals dense right hemiplegia and will withdraw minimally right upper and lower extremity to pain only. Purposeful antigravity movements in the left side. Sensation diminished on the right.  Right plantar is upgoing and left is downgoing. Gait was not tested.   Discharge Diet   Dysphagia 1 pudding thick liquids  Discharge Plan    Disposition:  Home with home health  [facility]: PT, OT, ST, RN, NA; hospital bed, 3N1, wheelchair  No anticoagulants for secondary stroke prevention at this time. Once blood isodense on CT, will resume low dose aspirin. 1 week later, ok to resume anticoagulation.  Ongoing risk factor control by Primary Care Physician.  Follow-up , MD in 1 month.  Follow-up with Dr. Milana Obey, Stroke Clinic in 2 months.  35 minutes were spent preparing discharge.  Signed Delia Heady, AVNP, ANP-BC, [facility] Nurse Practitioner 09/21/2013, 3:36 PM  I have personally examined this patient, reviewed pertinent data and developed the plan of care. I agree with above. 11/19/2013, MD

## 2013-09-23 ENCOUNTER — Telehealth: Payer: Self-pay | Admitting: Neurology

## 2013-09-23 DIAGNOSIS — I6350 Cerebral infarction due to unspecified occlusion or stenosis of unspecified cerebral artery: Secondary | ICD-10-CM

## 2013-09-23 NOTE — Telephone Encounter (Signed)
States that the patient was not discharged on a blood thinner and she and the family want to know if she needs to be on one. Also speech therapy was not ordered and she thinks it will be beneficial for the patient and would like an order for that as well.

## 2013-09-23 NOTE — Telephone Encounter (Signed)
She was not discharged on blood thinner because of brain hemorrhage. She needs her CT scan of the head in 2 weeks before starting aspirin. Order speech therapy

## 2013-09-24 NOTE — Telephone Encounter (Signed)
Called and left message with granddaughter and previous provider Zella Ball Myrick) that speech had been ordered for patient and that she will need another CT in 2 weeks, note concerning blood thinner

## 2013-09-25 ENCOUNTER — Telehealth: Payer: Self-pay | Admitting: Neurology

## 2013-09-25 NOTE — Telephone Encounter (Signed)
[name] found the instructions from Dr. Pearlean Brownie, please disregard the last message.

## 2013-09-25 NOTE — Telephone Encounter (Signed)
Kelly with [facility] calling with question about whether the patient is supposed to be on a blood thinner. Currently she is not prescribed anything. Please call to advise.

## 2013-09-25 NOTE — Telephone Encounter (Signed)
I spoke with [name] at [facility] and confirmed that they received orders for PT, OT, ST, Nursing, NA, and social work upon discharge from hospital.  Order for ST not necessary.

## 2013-09-25 NOTE — Addendum Note (Signed)
Addended by: Mickey Farber T on: 09/25/2013 11:19 AM   Modules accepted: Orders

## 2013-09-25 NOTE — Telephone Encounter (Signed)
Noted  

## 2013-09-30 ENCOUNTER — Telehealth: Payer: Self-pay | Admitting: *Deleted

## 2013-09-30 NOTE — Telephone Encounter (Signed)
Ok to do so

## 2013-09-30 NOTE — Telephone Encounter (Signed)
Also this would include wound care nurse consult.  I called and gave the verbal order to Colp at [facility] [phone] from Dr. Pearlean Brownie that this was ok for all above.

## 2013-10-01 ENCOUNTER — Encounter (HOSPITAL_COMMUNITY): Payer: Self-pay | Admitting: Emergency Medicine

## 2013-10-01 ENCOUNTER — Emergency Department (HOSPITAL_COMMUNITY): Payer: Medicare Other

## 2013-10-01 ENCOUNTER — Inpatient Hospital Stay (HOSPITAL_COMMUNITY)
Admission: EM | Admit: 2013-10-01 | Discharge: 2013-10-08 | DRG: 871 | Disposition: A | Discharge status: Hospice | Payer: Medicare Other | Attending: Internal Medicine | Admitting: Internal Medicine

## 2013-10-01 DIAGNOSIS — I4891 Unspecified atrial fibrillation: Secondary | ICD-10-CM | POA: Diagnosis present

## 2013-10-01 DIAGNOSIS — E41 Nutritional marasmus: Secondary | ICD-10-CM | POA: Diagnosis present

## 2013-10-01 DIAGNOSIS — E43 Unspecified severe protein-calorie malnutrition: Secondary | ICD-10-CM | POA: Diagnosis present

## 2013-10-01 DIAGNOSIS — A419 Sepsis, unspecified organism: Principal | ICD-10-CM | POA: Diagnosis present

## 2013-10-01 DIAGNOSIS — N179 Acute kidney failure, unspecified: Secondary | ICD-10-CM | POA: Diagnosis present

## 2013-10-01 DIAGNOSIS — R6520 Severe sepsis without septic shock: Secondary | ICD-10-CM

## 2013-10-01 DIAGNOSIS — R64 Cachexia: Secondary | ICD-10-CM | POA: Diagnosis present

## 2013-10-01 DIAGNOSIS — I2510 Atherosclerotic heart disease of native coronary artery without angina pectoris: Secondary | ICD-10-CM | POA: Diagnosis present

## 2013-10-01 DIAGNOSIS — M109 Gout, unspecified: Secondary | ICD-10-CM | POA: Diagnosis present

## 2013-10-01 DIAGNOSIS — I639 Cerebral infarction, unspecified: Secondary | ICD-10-CM | POA: Diagnosis present

## 2013-10-01 DIAGNOSIS — L89609 Pressure ulcer of unspecified heel, unspecified stage: Secondary | ICD-10-CM | POA: Diagnosis present

## 2013-10-01 DIAGNOSIS — A498 Other bacterial infections of unspecified site: Secondary | ICD-10-CM | POA: Diagnosis present

## 2013-10-01 DIAGNOSIS — Z7401 Bed confinement status: Secondary | ICD-10-CM

## 2013-10-01 DIAGNOSIS — IMO0002 Reserved for concepts with insufficient information to code with codable children: Secondary | ICD-10-CM

## 2013-10-01 DIAGNOSIS — L89899 Pressure ulcer of other site, unspecified stage: Secondary | ICD-10-CM | POA: Diagnosis present

## 2013-10-01 DIAGNOSIS — E039 Hypothyroidism, unspecified: Secondary | ICD-10-CM | POA: Diagnosis present

## 2013-10-01 DIAGNOSIS — I509 Heart failure, unspecified: Secondary | ICD-10-CM | POA: Diagnosis present

## 2013-10-01 DIAGNOSIS — I248 Other forms of acute ischemic heart disease: Secondary | ICD-10-CM | POA: Diagnosis present

## 2013-10-01 DIAGNOSIS — Z515 Encounter for palliative care: Secondary | ICD-10-CM

## 2013-10-01 DIAGNOSIS — I5022 Chronic systolic (congestive) heart failure: Secondary | ICD-10-CM | POA: Diagnosis present

## 2013-10-01 DIAGNOSIS — E8809 Other disorders of plasma-protein metabolism, not elsewhere classified: Secondary | ICD-10-CM | POA: Diagnosis present

## 2013-10-01 DIAGNOSIS — E870 Hyperosmolality and hypernatremia: Secondary | ICD-10-CM | POA: Diagnosis present

## 2013-10-01 DIAGNOSIS — Z7189 Other specified counseling: Secondary | ICD-10-CM

## 2013-10-01 DIAGNOSIS — Z8673 Personal history of transient ischemic attack (TIA), and cerebral infarction without residual deficits: Secondary | ICD-10-CM

## 2013-10-01 DIAGNOSIS — D696 Thrombocytopenia, unspecified: Secondary | ICD-10-CM | POA: Diagnosis not present

## 2013-10-01 DIAGNOSIS — L89309 Pressure ulcer of unspecified buttock, unspecified stage: Secondary | ICD-10-CM | POA: Diagnosis present

## 2013-10-01 DIAGNOSIS — E876 Hypokalemia: Secondary | ICD-10-CM | POA: Diagnosis not present

## 2013-10-01 DIAGNOSIS — N390 Urinary tract infection, site not specified: Secondary | ICD-10-CM | POA: Diagnosis present

## 2013-10-01 DIAGNOSIS — E119 Type 2 diabetes mellitus without complications: Secondary | ICD-10-CM | POA: Diagnosis present

## 2013-10-01 DIAGNOSIS — L8990 Pressure ulcer of unspecified site, unspecified stage: Secondary | ICD-10-CM | POA: Diagnosis present

## 2013-10-01 DIAGNOSIS — I252 Old myocardial infarction: Secondary | ICD-10-CM

## 2013-10-01 DIAGNOSIS — M069 Rheumatoid arthritis, unspecified: Secondary | ICD-10-CM | POA: Diagnosis present

## 2013-10-01 DIAGNOSIS — L89109 Pressure ulcer of unspecified part of back, unspecified stage: Secondary | ICD-10-CM | POA: Diagnosis present

## 2013-10-01 DIAGNOSIS — L8992 Pressure ulcer of unspecified site, stage 2: Secondary | ICD-10-CM | POA: Diagnosis present

## 2013-10-01 DIAGNOSIS — Z79899 Other long term (current) drug therapy: Secondary | ICD-10-CM

## 2013-10-01 DIAGNOSIS — H409 Unspecified glaucoma: Secondary | ICD-10-CM | POA: Diagnosis present

## 2013-10-01 DIAGNOSIS — Z66 Do not resuscitate: Secondary | ICD-10-CM | POA: Diagnosis present

## 2013-10-01 DIAGNOSIS — Z9861 Coronary angioplasty status: Secondary | ICD-10-CM

## 2013-10-01 DIAGNOSIS — E872 Acidosis, unspecified: Secondary | ICD-10-CM | POA: Diagnosis present

## 2013-10-01 DIAGNOSIS — I2489 Other forms of acute ischemic heart disease: Secondary | ICD-10-CM | POA: Diagnosis present

## 2013-10-01 HISTORY — DX: Cerebral infarction, unspecified: I63.9

## 2013-10-01 LAB — CBC WITH DIFFERENTIAL/PLATELET
Basophils Absolute: 0 10*3/uL (ref 0.0–0.1)
Basophils Relative: 0 % (ref 0–1)
Eosinophils Absolute: 0 10*3/uL (ref 0.0–0.7)
Eosinophils Relative: 0 % (ref 0–5)
HCT: 37.1 % (ref 36.0–46.0)
HEMOGLOBIN: 12.4 g/dL (ref 12.0–15.0)
LYMPHS ABS: 1.8 10*3/uL (ref 0.7–4.0)
LYMPHS PCT: 11 % — AB (ref 12–46)
MCH: 32.4 pg (ref 26.0–34.0)
MCHC: 33.4 g/dL (ref 30.0–36.0)
MCV: 96.9 fL (ref 78.0–100.0)
MONO ABS: 0.6 10*3/uL (ref 0.1–1.0)
MONOS PCT: 4 % (ref 3–12)
NEUTROS ABS: 13.6 10*3/uL — AB (ref 1.7–7.7)
NEUTROS PCT: 85 % — AB (ref 43–77)
Platelets: 196 10*3/uL (ref 150–400)
RBC: 3.83 MIL/uL — AB (ref 3.87–5.11)
RDW: 16.4 % — ABNORMAL HIGH (ref 11.5–15.5)
WBC: 16 10*3/uL — ABNORMAL HIGH (ref 4.0–10.5)

## 2013-10-01 LAB — GLUCOSE, CAPILLARY: GLUCOSE-CAPILLARY: 222 mg/dL — AB (ref 70–99)

## 2013-10-01 LAB — URINALYSIS, ROUTINE W REFLEX MICROSCOPIC
Glucose, UA: NEGATIVE mg/dL
KETONES UR: 15 mg/dL — AB
NITRITE: NEGATIVE
Protein, ur: 30 mg/dL — AB
Specific Gravity, Urine: 1.024 (ref 1.005–1.030)
Urobilinogen, UA: 1 mg/dL (ref 0.0–1.0)
pH: 5 (ref 5.0–8.0)

## 2013-10-01 LAB — POCT I-STAT 3, VENOUS BLOOD GAS (G3P V)
ACID-BASE DEFICIT: 1 mmol/L (ref 0.0–2.0)
BICARBONATE: 23.4 meq/L (ref 20.0–24.0)
O2 Saturation: 69 %
PO2 VEN: 36 mmHg (ref 30.0–45.0)
TCO2: 25 mmol/L (ref 0–100)
pCO2, Ven: 39 mmHg — ABNORMAL LOW (ref 45.0–50.0)
pH, Ven: 7.386 — ABNORMAL HIGH (ref 7.250–7.300)

## 2013-10-01 LAB — BASIC METABOLIC PANEL
BUN: 46 mg/dL — ABNORMAL HIGH (ref 6–23)
CO2: 21 mEq/L (ref 19–32)
Calcium: 8.3 mg/dL — ABNORMAL LOW (ref 8.4–10.5)
Chloride: 118 mEq/L — ABNORMAL HIGH (ref 96–112)
Creatinine, Ser: 1.11 mg/dL — ABNORMAL HIGH (ref 0.50–1.10)
GFR calc Af Amer: 49 mL/min — ABNORMAL LOW (ref >90)
GFR, EST NON AFRICAN AMERICAN: 42 mL/min — AB (ref >90)
Glucose, Bld: 208 mg/dL — ABNORMAL HIGH (ref 70–99)
Potassium: 3.4 mEq/L — ABNORMAL LOW (ref 3.7–5.3)
SODIUM: 156 meq/L — AB (ref 137–147)

## 2013-10-01 LAB — COMPREHENSIVE METABOLIC PANEL
ALK PHOS: 84 U/L (ref 39–117)
ALT: 11 U/L (ref 0–35)
AST: 19 U/L (ref 0–37)
Albumin: 2.4 g/dL — ABNORMAL LOW (ref 3.5–5.2)
BILIRUBIN TOTAL: 0.7 mg/dL (ref 0.3–1.2)
BUN: 44 mg/dL — AB (ref 6–23)
CHLORIDE: 113 meq/L — AB (ref 96–112)
CO2: 22 meq/L (ref 19–32)
CREATININE: 1.17 mg/dL — AB (ref 0.50–1.10)
Calcium: 9.2 mg/dL (ref 8.4–10.5)
GFR, EST AFRICAN AMERICAN: 46 mL/min — AB (ref >90)
GFR, EST NON AFRICAN AMERICAN: 39 mL/min — AB (ref >90)
GLUCOSE: 199 mg/dL — AB (ref 70–99)
POTASSIUM: 3.8 meq/L (ref 3.7–5.3)
Sodium: 155 mEq/L — ABNORMAL HIGH (ref 137–147)
Total Protein: 6.3 g/dL (ref 6.0–8.3)

## 2013-10-01 LAB — TSH: TSH: 0.664 u[IU]/mL (ref 0.350–4.500)

## 2013-10-01 LAB — URINE MICROSCOPIC-ADD ON

## 2013-10-01 LAB — SODIUM, URINE, RANDOM: SODIUM UR: 39 meq/L

## 2013-10-01 LAB — LACTIC ACID, PLASMA
LACTIC ACID, VENOUS: 2.9 mmol/L — AB (ref 0.5–2.2)
LACTIC ACID, VENOUS: 2.5 mmol/L — AB (ref 0.5–2.2)

## 2013-10-01 LAB — INFLUENZA PANEL BY PCR (TYPE A & B)
H1N1 flu by pcr: NOT DETECTED
INFLAPCR: NEGATIVE
INFLBPCR: NEGATIVE

## 2013-10-01 LAB — CREATININE, URINE, RANDOM: Creatinine, Urine: 148.55 mg/dL

## 2013-10-01 LAB — TROPONIN I: Troponin I: 0.73 ng/mL (ref <0.30)

## 2013-10-01 MED ORDER — SODIUM CHLORIDE 0.9 % IV SOLN
INTRAVENOUS | Status: DC
  Administered 2013-10-02: 01:00:00 via INTRAVENOUS

## 2013-10-01 MED ORDER — SODIUM CHLORIDE 0.9 % IV BOLUS (SEPSIS)
500.0000 mL | Freq: Once | INTRAVENOUS | Status: AC
  Administered 2013-10-01: 500 mL via INTRAVENOUS

## 2013-10-01 MED ORDER — VITAMIN B-12 1000 MCG PO TABS
1000.0000 ug | ORAL_TABLET | Freq: Every day | ORAL | Status: DC
  Administered 2013-10-01 – 2013-10-08 (×7): 1000 ug via ORAL
  Filled 2013-10-01 (×8): qty 1

## 2013-10-01 MED ORDER — RESOURCE THICKENUP CLEAR PO POWD
1.0000 | ORAL | Status: DC | PRN
  Filled 2013-10-01: qty 125

## 2013-10-01 MED ORDER — LATANOPROST 0.005 % OP SOLN
1.0000 [drp] | Freq: Every day | OPHTHALMIC | Status: DC
  Administered 2013-10-01 – 2013-10-07 (×7): 1 [drp] via OPHTHALMIC
  Filled 2013-10-01: qty 2.5

## 2013-10-01 MED ORDER — ACETAMINOPHEN 650 MG RE SUPP
650.0000 mg | Freq: Once | RECTAL | Status: AC
  Administered 2013-10-01: 650 mg via RECTAL
  Filled 2013-10-01: qty 1

## 2013-10-01 MED ORDER — SODIUM CHLORIDE 0.9 % IV SOLN
INTRAVENOUS | Status: DC
Start: 2013-10-01 — End: 2013-10-01

## 2013-10-01 MED ORDER — SODIUM CHLORIDE 0.9 % IJ SOLN
3.0000 mL | Freq: Two times a day (BID) | INTRAMUSCULAR | Status: DC
  Administered 2013-10-02 – 2013-10-08 (×11): 3 mL via INTRAVENOUS

## 2013-10-01 MED ORDER — LEVOTHYROXINE SODIUM 125 MCG PO TABS
125.0000 ug | ORAL_TABLET | Freq: Every day | ORAL | Status: DC
  Administered 2013-10-02 – 2013-10-08 (×7): 125 ug via ORAL
  Filled 2013-10-01 (×8): qty 1

## 2013-10-01 MED ORDER — SIMVASTATIN 10 MG PO TABS
10.0000 mg | ORAL_TABLET | Freq: Every day | ORAL | Status: DC
  Administered 2013-10-01 – 2013-10-07 (×7): 10 mg via ORAL
  Filled 2013-10-01 (×8): qty 1

## 2013-10-01 MED ORDER — SODIUM CHLORIDE 0.9 % IV SOLN
INTRAVENOUS | Status: DC
Start: 2013-10-01 — End: 2013-10-01
  Administered 2013-10-01: 18:00:00 via INTRAVENOUS

## 2013-10-01 MED ORDER — NITROGLYCERIN 0.4 MG SL SUBL: 0.4000 mg | SUBLINGUAL_TABLET | SUBLINGUAL | Status: DC | PRN

## 2013-10-01 MED ORDER — PIPERACILLIN-TAZOBACTAM 3.375 G IVPB
3.3750 g | Freq: Three times a day (TID) | INTRAVENOUS | Status: DC
  Administered 2013-10-01 – 2013-10-04 (×9): 3.375 g via INTRAVENOUS
  Filled 2013-10-01 (×10): qty 50

## 2013-10-01 MED ORDER — ONDANSETRON HCL 4 MG/2ML IJ SOLN: 4.0000 mg | Freq: Four times a day (QID) | INTRAMUSCULAR | Status: DC | PRN

## 2013-10-01 MED ORDER — PIPERACILLIN-TAZOBACTAM 3.375 G IVPB
3.3750 g | Freq: Once | INTRAVENOUS | Status: AC
  Administered 2013-10-01: 3.375 g via INTRAVENOUS
  Filled 2013-10-01: qty 50

## 2013-10-01 MED ORDER — BRIMONIDINE TARTRATE-TIMOLOL 0.2-0.5 % OP SOLN: 1.0000 [drp] | Freq: Two times a day (BID) | OPHTHALMIC | Status: DC

## 2013-10-01 MED ORDER — VANCOMYCIN HCL 500 MG IV SOLR
500.0000 mg | INTRAVENOUS | Status: DC
  Administered 2013-10-02 – 2013-10-03 (×2): 500 mg via INTRAVENOUS
  Filled 2013-10-01 (×2): qty 500

## 2013-10-01 MED ORDER — DOCUSATE SODIUM 100 MG PO CAPS
100.0000 mg | ORAL_CAPSULE | Freq: Every day | ORAL | Status: DC | PRN
  Filled 2013-10-01: qty 1

## 2013-10-01 MED ORDER — BRIMONIDINE TARTRATE 0.2 % OP SOLN
1.0000 [drp] | Freq: Two times a day (BID) | OPHTHALMIC | Status: DC
Start: 2013-10-01 — End: 2013-10-08
  Administered 2013-10-01 – 2013-10-08 (×14): 1 [drp] via OPHTHALMIC
  Filled 2013-10-01: qty 5

## 2013-10-01 MED ORDER — ONDANSETRON HCL 4 MG PO TABS: 4.0000 mg | ORAL_TABLET | Freq: Four times a day (QID) | ORAL | Status: DC | PRN

## 2013-10-01 MED ORDER — BRINZOLAMIDE 1 % OP SUSP
1.0000 [drp] | Freq: Three times a day (TID) | OPHTHALMIC | Status: DC
  Administered 2013-10-01 – 2013-10-08 (×21): 1 [drp] via OPHTHALMIC
  Filled 2013-10-01: qty 10

## 2013-10-01 MED ORDER — ACETAMINOPHEN 325 MG PO TABS
650.0000 mg | ORAL_TABLET | Freq: Four times a day (QID) | ORAL | Status: DC | PRN
  Administered 2013-10-06: 650 mg via ORAL
  Filled 2013-10-01: qty 2

## 2013-10-01 MED ORDER — PREDNISOLONE ACETATE 1 % OP SUSP
1.0000 [drp] | Freq: Two times a day (BID) | OPHTHALMIC | Status: DC
  Administered 2013-10-01 – 2013-10-08 (×14): 1 [drp] via OPHTHALMIC
  Filled 2013-10-01: qty 1

## 2013-10-01 MED ORDER — ACETAMINOPHEN 650 MG RE SUPP: 650.0000 mg | Freq: Four times a day (QID) | RECTAL | Status: DC | PRN

## 2013-10-01 MED ORDER — TIMOLOL MALEATE 0.5 % OP SOLN
1.0000 [drp] | Freq: Two times a day (BID) | OPHTHALMIC | Status: DC
  Administered 2013-10-01 – 2013-10-08 (×14): 1 [drp] via OPHTHALMIC
  Filled 2013-10-01: qty 5

## 2013-10-01 MED ORDER — VANCOMYCIN HCL 10 G IV SOLR
1250.0000 mg | Freq: Once | INTRAVENOUS | Status: AC
  Administered 2013-10-01: 1250 mg via INTRAVENOUS
  Filled 2013-10-01: qty 1250

## 2013-10-01 NOTE — Significant Event (Signed)
CRITICAL VALUE ALERT  Critical value received:  Troponin   Date of notification:  10/01/2013  Time of notification:  1910   Critical value read back:yes   Nurse who received alert:  Juliann Pares RN  MD notified (1st page):  Dr. Virgina Organ  Time of first page:  1925   MD notified (2nd page):  Time of second page:  Responding MD:  Dr. Virgina Organ    Time MD responded:  [phone]

## 2013-10-01 NOTE — H&P (Signed)
Date: 10/01/2013               Patient Name:  [name] MRN: [id number]  DOB: 12-12-1920 Age / Sex: 80 y.o., female   PCP: Milana Obey, MD         Medical Service: Internal Medicine Teaching Service         Attending Physician: Dr. Farley Ly, MD    First Contact: Dr. Johna Roles Pager: [phone]  Second Contact: Dr. Virgina Organ Pager: [phone]       After Hours (After 5p/  First Contact Pager: [phone]  weekends / holidays): Second Contact Pager: [phone]   Chief Complaint:  altered mental status  History of Present Illness:   [name] is a 80 year old woman with past medical history of hypertension, hyperlipidemia,  non-insulin Type 2 DM, systolic CHF (30-35% on 09/26/13), CAD s/p MI and stent, atrial fibrillation, CVA Jan 215,  Hypothyroidism on replacement, rheumatoid arthritis, and gout who presents with altered mental status per family.  Per family pt lives at home and was feeling her normal self until seen by PT yesterday during which time she was fatigued and could not participate in activities. She was reported to be less vocal with decreased appetite, PO intake, and urine output that continued this morning. No reports of rhinorrhea, change in baseline cough, nausea, vomiting, abdominal pain, headache, or change in BM. She was found to have fever last night but could not take any medications. She has incontinence at baseline and wears chronic diaper.  She had recent stroke in January with left sided residual weakness, difficulty swallowing, and has been bedbound since that time. Since 1 week ago, pt has had enlarging ulcer on her buttocks region with no drainage or odor. She has not been on any AP or AC therapy since her stroke.      Meds: No current facility-administered medications for this encounter.   Current Outpatient Prescriptions  Medication Sig Dispense Refill  . allopurinol (ZYLOPRIM) 100 MG tablet Take 100 mg by mouth daily.      . bimatoprost  (LUMIGAN) 0.03 % ophthalmic solution Place 1 drop into both eyes at bedtime.      . brimonidine-timolol (COMBIGAN) 0.2-0.5 % ophthalmic solution Place 1 drop into both eyes every 12 (twelve) hours.      . brinzolamide (AZOPT) 1 % ophthalmic suspension Place 1 drop into both eyes 3 (three) times daily.      . carvedilol (COREG) 6.25 MG tablet Take 6.25 mg by mouth 2 (two) times daily with a meal.      . docusate sodium (COLACE) 100 MG capsule Take 100 mg by mouth daily as needed for mild constipation.      Marland Kitchen levothyroxine (SYNTHROID, LEVOTHROID) 125 MCG tablet Take 125 mcg by mouth daily.      . methotrexate (RHEUMATREX) 2.5 MG tablet Take 10 mg by mouth once a week. Caution:Chemotherapy. Protect from light. On Saturdays      . nitroGLYCERIN (NITROSTAT) 0.4 MG SL tablet Place 0.4 mg under the tongue every 5 (five) minutes as needed for chest pain.      Marland Kitchen Psyllium (METAMUCIL PO) Take 1 packet by mouth daily as needed (constipation).      . Maltodextrin-Xanthan Gum (RESOURCE THICKENUP CLEAR) POWD Take 120 g by mouth as needed (thicken liquids to nectar thick consistency).  1 Can  12  . metFORMIN (GLUCOPHAGE) 500 MG tablet Take 500 mg by mouth 2 (two) times daily with a meal.       .  pravastatin (PRAVACHOL) 20 MG tablet Take 20 mg by mouth every evening.      . prednisoLONE acetate (PRED FORTE) 1 % ophthalmic suspension Place 1 drop into the right eye 2 (two) times daily.      . ramipril (ALTACE) 1.25 MG capsule Take 1.25 mg by mouth daily.      . vitamin B-12 (CYANOCOBALAMIN) 1000 MCG tablet Take 1,000 mcg by mouth daily.        Allergies: Allergies as of 10/01/2013  . (No Known Allergies)   Past Medical History  Diagnosis Date  . Diabetes mellitus   . Glaucoma   . A-fib   . Myocardial infarction     3.5x34mm Veriflex stent 02/03/2012  . Coronary artery disease   . Hypothyroidism   . CHF (congestive heart failure)   . Arthritis     RA  . Stroke 09/2013   Past Surgical History    Procedure Laterality Date  . Eye surgery    . Cardiac catheterization  02/03/12  . Tonsillectomy     No family history on file. History   Social History  . Marital Status: Widowed    Spouse Name: N/A    Number of Children: N/A  . Years of Education: N/A   Occupational History  . Not on file.   Social History Main Topics  . Smoking status: Never Smoker   . Smokeless tobacco: Not on file  . Alcohol Use: No  . Drug Use: Not on file  . Sexual Activity: Not on file   Other Topics Concern  . Not on file   Social History Narrative  . No narrative on file    Review of Systems: Review of Systems  Constitutional: Positive for fever and malaise/fatigue. Negative for chills.  HENT: Negative for congestion and sore throat.   Eyes: Negative for blurred vision.  Respiratory: Positive for cough (at baseline). Negative for sputum production, shortness of breath and wheezing.   Cardiovascular: Negative for chest pain, palpitations and leg swelling.  Gastrointestinal: Negative for nausea, vomiting, abdominal pain, diarrhea, constipation and blood in stool.  Genitourinary: Negative for hematuria.       Urinary incontinence with diaper  Musculoskeletal: Negative for myalgias.  Skin: Negative for rash.       Sacral ulcer  Neurological: Positive for weakness. Negative for dizziness, sensory change, speech change, focal weakness and headaches.     Physical Exam: Blood pressure 112/61, pulse 25, temperature 100.9 F (38.3 C), temperature source Rectal, resp. rate 38, SpO2 93.00%. Physical Exam  Constitutional: She is oriented to person, place, and time. No distress.  Cachetic appearing  HENT:  Head: Normocephalic and atraumatic.  Right Ear: External ear normal.  Left Ear: External ear normal.  Nose: Nose normal.  Mouth/Throat: Oropharynx is clear and moist. No oropharyngeal exudate.  Eyes: Conjunctivae and EOM are normal. Pupils are equal, round, and reactive to light. Right eye  exhibits no discharge. Left eye exhibits no discharge. No scleral icterus.  Neck: Normal range of motion. Neck supple.  Cardiovascular: Normal rate.   Irregular rhythm   Pulmonary/Chest: Effort normal and breath sounds normal.  Anterior breath fields clear  Abdominal: Soft. Bowel sounds are normal. She exhibits no distension. There is no tenderness. There is no rebound and no guarding.  Neurological: She is alert and oriented to person, place, and time. No cranial nerve deficit.  Mumbling, slow to respond to questions, no slurred speech. Left UE and LE 3/5 weakness, otherwise 4/5 throughout; No sensation  of left UE or LE. Pt immobile, unable to assess gait. Babinski (-)   Skin: Skin is warm and dry. She is not diaphoretic.  Sacral wound ulcer of approx 7 cm over gluteal cleft region without purulent discharge, odor, or appearance of underlying subcutaneous fat, tendon, or bone Left heel and 5th digit ulceration    Lab results: Basic Metabolic Panel:  Recent Labs  46/95/07 1030  NA 155*  K 3.8  CL 113*  CO2 22  GLUCOSE 199*  BUN 44*  CREATININE 1.17*  CALCIUM 9.2   Liver Function Tests:  Recent Labs  10/01/13 1030  AST 19  ALT 11  ALKPHOS 84  BILITOT 0.7  PROT 6.3  ALBUMIN 2.4*   No results found for this basename: LIPASE, AMYLASE,  in the last 72 hours No results found for this basename: AMMONIA,  in the last 72 hours CBC:  Recent Labs  10/01/13 1030  WBC 16.0*  NEUTROABS 13.6*  HGB 12.4  HCT 37.1  MCV 96.9  PLT 196   Cardiac Enzymes: No results found for this basename: CKTOTAL, CKMB, CKMBINDEX, TROPONINI,  in the last 72 hours BNP: No results found for this basename: PROBNP,  in the last 72 hours D-Dimer: No results found for this basename: DDIMER,  in the last 72 hours CBG:  Recent Labs  10/01/13 1100  GLUCAP 222*   Hemoglobin A1C: No results found for this basename: HGBA1C,  in the last 72 hours Fasting Lipid Panel: No results found for this  basename: CHOL, HDL, LDLCALC, TRIG, CHOLHDL, LDLDIRECT,  in the last 72 hours Thyroid Function Tests: No results found for this basename: TSH, T4TOTAL, FREET4, T3FREE, THYROIDAB,  in the last 72 hours Anemia Panel: No results found for this basename: VITAMINB12, FOLATE, FERRITIN, TIBC, IRON, RETICCTPCT,  in the last 72 hours Coagulation: No results found for this basename: LABPROT, INR,  in the last 72 hours Urine Drug Screen: Drugs of Abuse     Component Value Date/Time   LABOPIA NONE DETECTED 09/15/2013 1709   COCAINSCRNUR NONE DETECTED 09/15/2013 1709   LABBENZ NONE DETECTED 09/15/2013 1709   AMPHETMU NONE DETECTED 09/15/2013 1709   THCU NONE DETECTED 09/15/2013 1709   LABBARB NONE DETECTED 09/15/2013 1709    Alcohol Level: No results found for this basename: ETH,  in the last 72 hours Urinalysis:  Recent Labs  10/01/13 1127  COLORURINE ORANGE*  LABSPEC 1.024  PHURINE 5.0  GLUCOSEU NEGATIVE  HGBUR SMALL*  BILIRUBINUR MODERATE*  KETONESUR 15*  PROTEINUR 30*  UROBILINOGEN 1.0  NITRITE NEGATIVE  LEUKOCYTESUR MODERATE*    Imaging results:  Ct Head Wo Contrast  10/01/2013   CLINICAL DATA:  Increasing lethargy.  EXAM: CT HEAD WITHOUT CONTRAST  TECHNIQUE: Contiguous axial images were obtained from the base of the skull through the vertex without intravenous contrast.  COMPARISON:  09/19/2013.  FINDINGS: The right lenticular nucleus hemorrhage with extension into the right corona radiata is less dense than on the prior examination consistent with red cell lysis cysts with surrounding vasogenic edema. Although slow, this represent expected evolution of intracranial hemorrhage. Mass-effect upon the right ventricle has decreased in the interim.  MR detected tiny amount hemorrhage left parietal lobe not appreciated on present CT.  Remote right occipital lobe infarct.  Small vessel disease type changes without CT evidence of large acute thrombotic infarct.  No intracranial mass lesion separate from  above described findings.  Atrophy. Ventricular prominence probably related to atrophy rather than hydrocephalus  Tiny amount  of gas in the left cavernous sinus region may be related to IV.  IMPRESSION: The right lenticular nucleus hemorrhage with extension into the right corona radiata is less dense than on the prior examination consistent with red cell lysis cysts with surrounding vasogenic edema. Although slow, this represent expected evolution of intracranial hemorrhage. Mass-effect upon the right ventricle has decreased in the interim.   Electronically Signed   By: Bridgett Larsson M.D.   On: 10/01/2013 12:19   Dg Chest Port 1 View  10/01/2013   CLINICAL DATA:  Fever and cerebral infarction.  EXAM: PORTABLE CHEST - 1 VIEW  COMPARISON:  09/16/2013  FINDINGS: There is stable cardiomegaly. No overt edema, infiltrate or pleural fluid is identified. The visualized bony thorax is unremarkable.  IMPRESSION: No acute infiltrate identified.  Stable cardiomegaly.   Electronically Signed   By: Irish Lack M.D.   On: 10/01/2013 11:46    Other results: EKG:  Date/Time: Thursday October 01 2013 10:09:52 EST  Ventricular Rate: 126  PR Interval:  QRS Duration: 75  QT Interval: 305  QTC Calculation: 441  R Axis: 20  Text Interpretation: Atrial fibrillation Probable anterior infarct, age indeterminate No significant change since last tracing   Assessment:  80 year old woman with past medical history of hypertension, hyperlipidemia,  non-insulin Type 2 DM, systolic CHF (30-35% on 09/26/13), CAD s/p MI and stent, atrial fibrillation, CVA Jan 215,  Hypothyroidism on replacement, rheumatoid arthritis, and gout who presents with altered mental status per family and found to be in sepsis.    Plan:  Sepsis of unknown origin - Pt presented with SIRS+ (T 100.9, HR 122, RR 28, WBC 16K with neutrophilia ) and found to be hypotensive with blood pressure of 80/33 requiring fluid resuscitation. Lactic acid was elevated at  2.9 on admission.  Source of infection unclear but most likely due to UTI (UA with moderate LE, hematuria) vs sacral pressure ulcer (Stage 2 ulcer). Pt was found to be influenza negative with no acute cardiopulmonary disease on chest xray.  -Administer fluid bolus as needed for hypotension, hold anti-hypertensives -Obtain blood cultures x 2  -Awaiting urine cultures  -Wound care consult -Start broad spectrum coverage with  IV vancomycin and zosyn per pharmacy  -Monitor leukocytosis   Sacral Pressure Ulcer - Pt with Stage 2(?) pressure wound ulcer without subcutaneous fat/ tendon/bone penetration of 1 week duration due to bed bound status after ischemic stroke prior this month. -Wound care consult  Hypertonic Hypernatremia - Osmolarity of 336 with sodium of 155. Most likely due to hypovolemia in setting of hypotension.   -Administer IVF's -Continue to monitor  Acute Kidney Injury - Pt with last Cr was 0.78 on 1/10, today 1.17. Most likely due to pre-renal azotemia (hypolemia )  vs renal (ATN due to sepsis) vs post-obstructive (decreased urinary output) -Hold home ramipril  -Administer IVFs  -Monitor daily weight and I & Os -Obtain UNa and UCr to calculate FeNa -Insert foley catheter  Atrial Fibrillation - currently in atrial fibrillation on carvedilol 6.25 mg BID and no AC therapy -Hold carvedilol 6.25 mg BID in setting  -Place SCDs -Consider neurology consult for recommendations for AP or AC therapy --> per Dr. Roseanne Reno not isodense, so will not start AP or AC therapy   Systolic Congestive Failure - Last 2D-echo on 09/16/13 with reduced EF of 30-35% and Akinesis of the entireanterior myocardium. LV diastolic could not be assessed. -Obtain daily weight and I &O's -Gentle IVF's  Ischemic Stroke -  Pt  with hemorrhagic conversion of a right lentiform nucleus infarct s/p IV tPA, infarct embolic secondary to atrial fibrillation during last hospitalization 1/6-1/12. -CT Head w/o contrast --->  expected evolution of intracranial hemorrhage -Consider neurology consult for recommendations for AP or AC therapy --> per Dr. Roseanne Reno not isodense, so will not start AP or AC therapy -Hold AC or AP therapy for now -Obtain INR  Coronary Artery Disease - Pt with acute anterolateral myocardial infarction that occurred on 02/03/2012 with successful PTCA and stenting of the mid LAD with a non-drug-eluting 3.5 x 24 mm Veriflex stent. -Continue home pravastatin 20 mg daily  -Nitroglycerin as needed for CP  -Obtain troponin   Non- insulin-Type II Diabetes Mellitus - HbA1c on 6.5 on 09/16/13 -Hold home metformin 500 mg BID -Monitor glucose frequently   Hypertension -  Currently normotensive -Hold home rampiril 1.25 mg daily in setting of hypotension   Hypothyroidism - Last TSH 1.327 on 02/03/12.  -Continue levothyroxine 125 mcg daily -Obtain TSH  Gout - currently stable without flare -Continue allopurinol 100 mg daily  Rheumatoid Arthritis -currently stable with flare -Hold home methotrexate 2.5 mg daily    Diet: NPO  DVT PPx: SCDs Code: Full   Dispo: Disposition is deferred at this time, awaiting improvement of current medical problems. Anticipated discharge in approximately 3-5 day(s).   The patient does have a current PCP Milana Obey, MD) and does need an [facility] hospital follow-up appointment after discharge.  The patient does not have transportation limitations that hinder transportation to clinic appointments.  Signed: Otis Brace, MD 10/01/2013, 3:45 PM

## 2013-10-01 NOTE — ED Notes (Signed)
CBG- 222 

## 2013-10-01 NOTE — ED Notes (Signed)
Resident made aware of abnormal VBG

## 2013-10-01 NOTE — ED Notes (Signed)
PT HR increasing and pt with only 100 ml UO since arriving.  Bolus ordered.

## 2013-10-01 NOTE — ED Notes (Signed)
Pt placed on 2L [place] for RR of 28.

## 2013-10-01 NOTE — H&P (Signed)
[facility] Admission Note Date: 10/01/2013  Patient name: [name] Medical record number: [id number] Date of birth: [birth year] Age: 80 y.o. Gender: female PCP: Milana Obey, MD  Chief Complaint:  AMS   History of Present Illness:  Pt is a 80 yo female with PMH of recent hemorrhagic stroke, Systolic CHF w/ EF: 30% (09/16/12), atrial fibrillation, anterior wall MI, T2DM, glaucoma, hypothyroidism who presents for lethargy. History is provided by her grandaughter. After PT appointment yesterday, patient is more lethargic, less talkative, and eating less than baseline. Granddaughter says patient "felt hot" but did not take her temperature. This morning, patient continued to be less talkative than normal and refused her medication, so grandaughter called EMS. She denies any recent URI symptoms, vomiting, nausea, diarrhea. Grandaughter says patient has not complained of any new pain, but has noticed pt has urinated only 1 time in past 48 hours.  Of note, patient also has decubitus ulcer on her gluteal cleft approximately 7 cm big. Daughter says 1cm ulcer developed last Thursday, but got much bigger within the course of 24 hours. She denies drainage or purulence from the ulcer. Pt has been chronically bed bound for past 2 weeks s/p hospital discharge from a stroke on January 13th.  At the ED, she was febrile to 100.9, HR: 120 s/p Cardizem 10 mg, and hypotensive so code sepsis was initiated. Vanc/Zosyn was given empirically. CT showed improving ICH, CXR showed no acute infiltrate, but UA displayed moderate leuks, small Hgb, 11-20 WBC and small bacteria. Work up showed: WBC: 16, Na: 155, Cl: 113, BUN: 44, Cr: 1.17, CBG: 199, Albumin: 2.4. VBG showed pH: 7.38, pCO2: 39.0.    Review of Systems: ROS: Pt had fever last week, was given tylenol and has been afebrile since. No chills, nausea, vomiting. Rest of ROS was not taken due to patient's inability to answer questions.   Medical  History: Past Medical History  Diagnosis Date  . Diabetes mellitus   . Glaucoma   . A-fib   . Myocardial infarction     3.5x74mm Veriflex stent 02/03/2012  . Coronary artery disease   . Hypothyroidism   . CHF (congestive heart failure)   . Arthritis     RA  . Stroke 09/2013    Surgical History: Past Surgical History  Procedure Laterality Date  . Eye surgery    . Cardiac catheterization  02/03/12  . Tonsillectomy      Home Medications: No current outpatient prescriptions on file.  Allergies: Allergies as of 10/01/2013  . (No Known Allergies)    Family History: No family history on file.  Social History: History   Social History  . Marital Status: Widowed    Spouse Name: N/A    Number of Children: N/A  . Years of Education: N/A   Occupational History  . Not on file.   Social History Main Topics  . Smoking status: Never Smoker   . Smokeless tobacco: Not on file  . Alcohol Use: No  . Drug Use: Not on file  . Sexual Activity: Not on file   Other Topics Concern  . Not on file   Social History Narrative  . No narrative on file    Physical Exam: Vitals: Blood pressure 124/106, pulse 35, temperature 100.9 F (38.3 C), temperature source Rectal, resp. rate 31, height 5' 4.17" (1.63 m), weight 56.9 kg (125 lb 7.1 oz), SpO2 99.00%.  Gen: laying in bed, mostly nonverbal, no acute distress HENT: Head: normocephalic, no bruises or  lesions on R side, L side could not be visualized Eyes: R pupil dilated, L pupil normal sized, decreased EOM, pt is minimally reactive to bright light in eyes Oral/Throat: white plaque on tongue (scrapable), poor dentition, no oropharyngeal exudate CV: irregularly irregular rhythm, tachycardia Pulm: could not listen to posterior breath sounds due to patient's inability to sit up, anterior breath sounds were barely audible Abd: no tenderness to palpation, no sores, no lesions, no abdominal scars Back: approx 7cm x 5cm ulcer on back  side adjacent to gluteal cleft. Skin in superficially worn away and pink mucosa underneath is visualized. No purulence or bleeding present.  Ext: No edema appreciated on UE or LE Pulses: diminished DP pulses bilaterally Neuro: Decreased sensation and motor control of left UE and LE. Right with 3/5 grip strength and 2/5 muscle strength. Sensation intact on R side.   Lab results: Results for orders placed during the hospital encounter of 10/01/13 (from the past 24 hour(s))  CBC WITH DIFFERENTIAL     Status: Abnormal   Collection Time    10/01/13 10:30 AM      Result Value Range   WBC 16.0 (*) 4.0 - 10.5 K/uL   RBC 3.83 (*) 3.87 - 5.11 MIL/uL   Hemoglobin 12.4  12.0 - 15.0 g/dL   HCT 11.9  14.7 - 82.9 %   MCV 96.9  78.0 - 100.0 fL   MCH 32.4  26.0 - 34.0 pg   MCHC 33.4  30.0 - 36.0 g/dL   RDW 56.2 (*) 13.0 - 86.5 %   Platelets 196  150 - 400 K/uL   Neutrophils Relative % 85 (*) 43 - 77 %   Neutro Abs 13.6 (*) 1.7 - 7.7 K/uL   Lymphocytes Relative 11 (*) 12 - 46 %   Lymphs Abs 1.8  0.7 - 4.0 K/uL   Monocytes Relative 4  3 - 12 %   Monocytes Absolute 0.6  0.1 - 1.0 K/uL   Eosinophils Relative 0  0 - 5 %   Eosinophils Absolute 0.0  0.0 - 0.7 K/uL   Basophils Relative 0  0 - 1 %   Basophils Absolute 0.0  0.0 - 0.1 K/uL  COMPREHENSIVE METABOLIC PANEL     Status: Abnormal   Collection Time    10/01/13 10:30 AM      Result Value Range   Sodium 155 (*) 137 - 147 mEq/L   Potassium 3.8  3.7 - 5.3 mEq/L   Chloride 113 (*) 96 - 112 mEq/L   CO2 22  19 - 32 mEq/L   Glucose, Bld 199 (*) 70 - 99 mg/dL   BUN 44 (*) 6 - 23 mg/dL   Creatinine, Ser 7.84 (*) 0.50 - 1.10 mg/dL   Calcium 9.2  8.4 - 69.6 mg/dL   Total Protein 6.3  6.0 - 8.3 g/dL   Albumin 2.4 (*) 3.5 - 5.2 g/dL   AST 19  0 - 37 U/L   ALT 11  0 - 35 U/L   Alkaline Phosphatase 84  39 - 117 U/L   Total Bilirubin 0.7  0.3 - 1.2 mg/dL   GFR calc non Af Amer 39 (*) >90 mL/min   GFR calc Af Amer 46 (*) >90 mL/min  LACTIC ACID, PLASMA      Status: Abnormal   Collection Time    10/01/13 10:30 AM      Result Value Range   Lactic Acid, Venous 2.9 (*) 0.5 - 2.2 mmol/L  GLUCOSE, CAPILLARY  Status: Abnormal   Collection Time    10/01/13 11:00 AM      Result Value Range   Glucose-Capillary 222 (*) 70 - 99 mg/dL  URINALYSIS, ROUTINE W REFLEX MICROSCOPIC     Status: Abnormal   Collection Time    10/01/13 11:27 AM      Result Value Range   Color, Urine ORANGE (*) YELLOW   APPearance CLOUDY (*) CLEAR   Specific Gravity, Urine 1.024  1.005 - 1.030   pH 5.0  5.0 - 8.0   Glucose, UA NEGATIVE  NEGATIVE mg/dL   Hgb urine dipstick SMALL (*) NEGATIVE   Bilirubin Urine MODERATE (*) NEGATIVE   Ketones, ur 15 (*) NEGATIVE mg/dL   Protein, ur 30 (*) NEGATIVE mg/dL   Urobilinogen, UA 1.0  0.0 - 1.0 mg/dL   Nitrite NEGATIVE  NEGATIVE   Leukocytes, UA MODERATE (*) NEGATIVE  URINE MICROSCOPIC-ADD ON     Status: Abnormal   Collection Time    10/01/13 11:27 AM      Result Value Range   Squamous Epithelial / LPF RARE  RARE   WBC, UA 11-20  <3 WBC/hpf   RBC / HPF 0-2  <3 RBC/hpf   Bacteria, UA FEW (*) RARE   Casts HYALINE CASTS (*) NEGATIVE  INFLUENZA PANEL BY PCR (TYPE A & B, H1N1)     Status: None   Collection Time    10/01/13  1:17 PM      Result Value Range   Influenza A By PCR NEGATIVE  NEGATIVE   Influenza B By PCR NEGATIVE  NEGATIVE   H1N1 flu by pcr NOT DETECTED  NOT DETECTED  POCT I-STAT 3, BLOOD GAS (G3P V)     Status: Abnormal   Collection Time    10/01/13  3:22 PM      Result Value Range   pH, Ven 7.386 (*) 7.250 - 7.300   pCO2, Ven 39.0 (*) 45.0 - 50.0 mmHg   pO2, Ven 36.0  30.0 - 45.0 mmHg   Bicarbonate 23.4  20.0 - 24.0 mEq/L   TCO2 25  0 - 100 mmol/L   O2 Saturation 69.0     Acid-base deficit 1.0  0.0 - 2.0 mmol/L   Collection site BRACHIAL ARTERY     Sample type VENOUS     Comment NOTIFIED PHYSICIAN       Imaging results:  Ct Head Wo Contrast  10/01/2013   CLINICAL DATA:  Increasing lethargy.   EXAM: CT HEAD WITHOUT CONTRAST  TECHNIQUE: Contiguous axial images were obtained from the base of the skull through the vertex without intravenous contrast.  COMPARISON:  09/19/2013.  FINDINGS: The right lenticular nucleus hemorrhage with extension into the right corona radiata is less dense than on the prior examination consistent with red cell lysis cysts with surrounding vasogenic edema. Although slow, this represent expected evolution of intracranial hemorrhage. Mass-effect upon the right ventricle has decreased in the interim.  MR detected tiny amount hemorrhage left parietal lobe not appreciated on present CT.  Remote right occipital lobe infarct.  Small vessel disease type changes without CT evidence of large acute thrombotic infarct.  No intracranial mass lesion separate from above described findings.  Atrophy. Ventricular prominence probably related to atrophy rather than hydrocephalus  Tiny amount of gas in the left cavernous sinus region may be related to IV.  IMPRESSION: The right lenticular nucleus hemorrhage with extension into the right corona radiata is less dense than on the prior examination consistent with red  cell lysis cysts with surrounding vasogenic edema. Although slow, this represent expected evolution of intracranial hemorrhage. Mass-effect upon the right ventricle has decreased in the interim.   Electronically Signed   By: Bridgett Larsson M.D.   On: 10/01/2013 12:19   Dg Chest Port 1 View  10/01/2013   CLINICAL DATA:  Fever and cerebral infarction.  EXAM: PORTABLE CHEST - 1 VIEW  COMPARISON:  09/16/2013  FINDINGS: There is stable cardiomegaly. No overt edema, infiltrate or pleural fluid is identified. The visualized bony thorax is unremarkable.  IMPRESSION: No acute infiltrate identified.  Stable cardiomegaly.   Electronically Signed   By: Irish Lack M.D.   On: 10/01/2013 11:46    Other results: EKG Results:  10/01/2013 Rate:  126 bpm PR:  * QRS:  75 ms QTc:  441 ms EKG:  Atrial fibrillation, probably anterior infarct   Assessment & Plan by Problem: Assessment: Pt is a 80 yo F with h/o recent hemorrhagic stroke, CHF, T2DM who presents with increased lethargy.  1.  Sepsis possibly secondary to UTI:  Ptis febrile to 100.9 with leukocytosis which suggests infection either secondary to UTI or decubitus ulcer. CXR showed no acute infiltrates and CT showed improving intracranial hemorrhage lesion. UA showed moderate leuks, few bacteria and 11-20 WBC which makes UTI the most likely source of infection. Patient was started on Vancomycin and Zosin in ED. Will need to cover for Gram negs (E. Coli, Enterobacter, Klebsiella, Proteus) and Gram+ (enterococci). Source of infection could also be open ulcer on patient's back given that patient is chronically bed bound and likely does not clean it well. However, ulcer is stage 2, does not penetrate to muscle/bone and does not show active signs of infection such as erythema or purulence. Pt also has history of aspiration, but given clear CXR, aspiration pneumonia is unlikely source of infection. Will treat UTI and consider other possible sources of infection if she does not improve.  -Vancomycin 1250 mg IV x1 dose -Zosyn 3.373 mg IV x1 dose -Pt's CrCl ~27.2 mL/min so doses must be renally adjusted -Blood cultures x2 -Urine cultures -Monitor CBC -Check Vanc Trough  2. Decubitus ulcer: Pt with stage 2 ulcer on back adjacent to gluteal cleft. Shallow open ulcer with red pink wound bed. Subcutaneous fat not visible, undermining/tunneling are unlikely. -wound care consult -family education about wound care  3. ?AKI: Pt admitted with Cr 1.17 (Cr: 0.78 on 1/10).  -Administer fluids  -Hold ramipril (ACEi) because potentially nephrotoxic  4. Stroke: asymptomatic hemorrhagic conversion of R lentiform nucleus infarct s/p IV tPA. Embolic infarct 2/2 to atrial fibrillation. -Neurology consult to determine need for Anticoagulation (patient  was on eliquis but it was stopped s/p intracranial hemorrhage)  5. Diabetes: HbA1C: 6.5 on 1/10. CBG: 222 on admission -Continue metformin 500 mg BID  6. Chronic systolic heart failure: -Continue Carvedilol 6.25 mg tab BID  7. Hypothyroidism:  -continue levothyroxine 125 mcg once daily    Signed by:  Willette Alma, MS3  **This is a medical student admission note**  10/01/2013, 5:09 PM

## 2013-10-01 NOTE — Progress Notes (Signed)
ANTIBIOTIC CONSULT NOTE - INITIAL  Pharmacy Consult for Vancomycin, Zosyn Indication: Sepsis   No Known Allergies  Patient Measurements:   Adjusted Body Weight: n/a  Vital Signs: Temp: 100.9 F (38.3 C) (01/22 1021) Temp src: Rectal (01/22 1021) BP: 124/106 mmHg (01/22 1700) Pulse Rate: 35 (01/22 1700) Intake/Output from previous day:   Intake/Output from this shift: Total I/O In: 1000 [I.V.:1000] Out: 100 [Urine:100]  Labs:  Recent Labs  10/01/13 1030  WBC 16.0*  HGB 12.4  PLT 196  CREATININE 1.17*   The CrCl is unknown because both a height and weight (above a minimum accepted value) are required for this calculation. No results found for this basename: VANCOTROUGH, Leodis Binet, VANCORANDOM, GENTTROUGH, GENTPEAK, GENTRANDOM, TOBRATROUGH, TOBRAPEAK, TOBRARND, AMIKACINPEAK, AMIKACINTROU, AMIKACIN,  in the last 72 hours   Microbiology: Recent Results (from the past 720 hour(s))  MRSA PCR SCREENING     Status: None   Collection Time    09/15/13  6:42 PM      Result Value Range Status   MRSA by PCR NEGATIVE  NEGATIVE Final   Comment:            The GeneXpert MRSA Assay (FDA     approved for NASAL specimens     only), is one component of a     comprehensive MRSA colonization     surveillance program. It is not     intended to diagnose MRSA     infection nor to guide or     monitor treatment for     MRSA infections.    Medical History: Past Medical History  Diagnosis Date  . Diabetes mellitus   . Glaucoma   . A-fib   . Myocardial infarction     3.5x39mm Veriflex stent 02/03/2012  . Coronary artery disease   . Hypothyroidism   . CHF (congestive heart failure)   . Arthritis     RA  . Stroke 09/2013    Medications:  Prescriptions prior to admission  Medication Sig Dispense Refill  . allopurinol (ZYLOPRIM) 100 MG tablet Take 100 mg by mouth daily.      . bimatoprost (LUMIGAN) 0.03 % ophthalmic solution Place 1 drop into both eyes at bedtime.      .  brimonidine-timolol (COMBIGAN) 0.2-0.5 % ophthalmic solution Place 1 drop into both eyes every 12 (twelve) hours.      . brinzolamide (AZOPT) 1 % ophthalmic suspension Place 1 drop into both eyes 3 (three) times daily.      . carvedilol (COREG) 6.25 MG tablet Take 6.25 mg by mouth 2 (two) times daily with a meal.      . docusate sodium (COLACE) 100 MG capsule Take 100 mg by mouth daily as needed for mild constipation.      Marland Kitchen levothyroxine (SYNTHROID, LEVOTHROID) 125 MCG tablet Take 125 mcg by mouth daily.      . methotrexate (RHEUMATREX) 2.5 MG tablet Take 10 mg by mouth once a week. Caution:Chemotherapy. Protect from light. On Saturdays      . nitroGLYCERIN (NITROSTAT) 0.4 MG SL tablet Place 0.4 mg under the tongue every 5 (five) minutes as needed for chest pain.      Marland Kitchen Psyllium (METAMUCIL PO) Take 1 packet by mouth daily as needed (constipation).      . Maltodextrin-Xanthan Gum (RESOURCE THICKENUP CLEAR) POWD Take 120 g by mouth as needed (thicken liquids to nectar thick consistency).  1 Can  12  . metFORMIN (GLUCOPHAGE) 500 MG tablet Take 500 mg by  mouth 2 (two) times daily with a meal.       . pravastatin (PRAVACHOL) 20 MG tablet Take 20 mg by mouth every evening.      . prednisoLONE acetate (PRED FORTE) 1 % ophthalmic suspension Place 1 drop into the right eye 2 (two) times daily.      . ramipril (ALTACE) 1.25 MG capsule Take 1.25 mg by mouth daily.      . vitamin B-12 (CYANOCOBALAMIN) 1000 MCG tablet Take 1,000 mcg by mouth daily.       Assessment: 33 YOF admitted with non-productive cough, lethargy and AMS. On arrival to the ED, she was febrile with a temp of 100.9 F. Pharmacy to start Vancomycin and Zosyn for treatment of sepsis. WBC 16, CrCl ~ 27.2 mL/min.   Doses in ED: Vancomycin 1250 mg IV x 1 dose Zosyn 3.375 gm IV x 1 dose   Cultures: 1/22 Blood Cx x2>> 1/22 Urine Cx x2 >>   Goal of Therapy:  Vancomycin trough level 15-20 mcg/ml  Plan:  1) Start Vancomycin maintenance  dose of 500 mg IV Q 24 hours 2) Start Zosyn 3.375 gm IV Q 8 hours (4-hour extended infusion) 3) Monitor CBC, renal fx, cultures, and patient clinical status 4) Collect vanc trough as necessary   Vinnie Level, PharmD.  Clinical Pharmacist Pager [phone]

## 2013-10-01 NOTE — ED Notes (Signed)
Called flow.  Pt is going to 2C.

## 2013-10-01 NOTE — ED Provider Notes (Signed)
CSN: [id number]     Arrival date & time 10/01/13  0814 History   First MD Initiated Contact with Patient 10/01/13 1000     Chief Complaint  Patient presents with  . decreased LOC    . Code Sepsis    HPI: [name] is a 80 yo F with history of DM, atrial fibrillation, recent hemorrhagic stroke, CHF and hypothyroidism, who presents with altered mental status. Her daughter provides history given her AMS. Last night after her PT session she was more lethargic than normal. This morning her family noted she was not responding normally so they called EMS. On arrival she is febrile to 100.9. Family endorses non-productive cough but no other infectious symptoms. She also has decubitus ulcer that has been getting larger for the last couple of weeks. They have not noted any diarrhea or vomiting    Past Medical History  Diagnosis Date  . Diabetes mellitus   . Glaucoma   . A-fib   . Myocardial infarction     3.5x31mm Veriflex stent 02/03/2012  . Coronary artery disease   . Hypothyroidism   . CHF (congestive heart failure)   . Arthritis     RA  . Stroke 09/2013   Past Surgical History  Procedure Laterality Date  . Eye surgery    . Cardiac catheterization  02/03/12  . Tonsillectomy     No family history on file. History  Substance Use Topics  . Smoking status: Never Smoker   . Smokeless tobacco: Not on file  . Alcohol Use: No   OB History   Grav Para Term Preterm Abortions TAB SAB Ect Mult Living                 Review of Systems  Unable to perform ROS: Acuity of condition    Allergies  Review of patient's allergies indicates no known allergies.  Home Medications   Current Outpatient Rx  Name  Route  Sig  Dispense  Refill  . allopurinol (ZYLOPRIM) 100 MG tablet   Oral   Take 100 mg by mouth daily.         . bimatoprost (LUMIGAN) 0.03 % ophthalmic solution   Both Eyes   Place 1 drop into both eyes at bedtime.         . brimonidine-timolol (COMBIGAN) 0.2-0.5 %  ophthalmic solution   Both Eyes   Place 1 drop into both eyes every 12 (twelve) hours.         . brinzolamide (AZOPT) 1 % ophthalmic suspension   Both Eyes   Place 1 drop into both eyes 3 (three) times daily.         . carvedilol (COREG) 6.25 MG tablet   Oral   Take 6.25 mg by mouth 2 (two) times daily with a meal.         . docusate sodium (COLACE) 100 MG capsule   Oral   Take 100 mg by mouth daily as needed for mild constipation.         Marland Kitchen levothyroxine (SYNTHROID, LEVOTHROID) 125 MCG tablet   Oral   Take 125 mcg by mouth daily.         . methotrexate (RHEUMATREX) 2.5 MG tablet   Oral   Take 10 mg by mouth once a week. Caution:Chemotherapy. Protect from light. On Saturdays         . nitroGLYCERIN (NITROSTAT) 0.4 MG SL tablet   Sublingual   Place 0.4 mg under the tongue every 5 (five)  minutes as needed for chest pain.         Marland Kitchen Psyllium (METAMUCIL PO)   Oral   Take 1 packet by mouth daily as needed (constipation).         . Maltodextrin-Xanthan Gum (RESOURCE THICKENUP CLEAR) POWD   Oral   Take 120 g by mouth as needed (thicken liquids to nectar thick consistency).   1 Can   12   . metFORMIN (GLUCOPHAGE) 500 MG tablet   Oral   Take 500 mg by mouth 2 (two) times daily with a meal.          . pravastatin (PRAVACHOL) 20 MG tablet   Oral   Take 20 mg by mouth every evening.         . prednisoLONE acetate (PRED FORTE) 1 % ophthalmic suspension   Right Eye   Place 1 drop into the right eye 2 (two) times daily.         . ramipril (ALTACE) 1.25 MG capsule   Oral   Take 1.25 mg by mouth daily.         . vitamin B-12 (CYANOCOBALAMIN) 1000 MCG tablet   Oral   Take 1,000 mcg by mouth daily.          BP 115/87  Pulse 39  Temp(Src) 100.9 F (38.3 C) (Rectal)  Resp 22  SpO2 100% Physical Exam  Nursing note and vitals reviewed. Constitutional: She appears lethargic. She is easily aroused. She appears ill. No distress.  Frail, elderly  female, laying in bed, GCS 13. She is lethargic and slow to respond to questions.   HENT:  Head: Normocephalic and atraumatic.  Mouth/Throat: Mucous membranes are dry.  Eyes: Conjunctivae and EOM are normal. Pupils are equal, round, and reactive to light.  Neck: Normal range of motion. Neck supple. JVD present.  Cardiovascular: Normal heart sounds and intact distal pulses.  An irregularly irregular rhythm present. Tachycardia present.   Pulmonary/Chest: Tachypnea noted. No respiratory distress. She has rales (at the bases).  Abdominal: Soft. Bowel sounds are decreased. There is no tenderness. There is no rebound and no guarding.  Musculoskeletal: She exhibits no edema and no tenderness.  Decubitus ulcer on sacrum. Mild erythema. No drainage.   Neurological: She is easily aroused. She appears lethargic. No cranial nerve deficit. GCS eye subscore is 3. GCS verbal subscore is 4. GCS motor subscore is 6.  Limited neurologic exam given mental status. No focal neurologic deficit noted. Moves all extremities.   Skin: Skin is warm and dry. No rash noted.    ED Course  Procedures (including critical care time) Labs Review Labs Reviewed  CBC WITH DIFFERENTIAL - Abnormal; Notable for the following:    WBC 16.0 (*)    RBC 3.83 (*)    RDW 16.4 (*)    Neutrophils Relative % 85 (*)    Neutro Abs 13.6 (*)    Lymphocytes Relative 11 (*)    All other components within normal limits  COMPREHENSIVE METABOLIC PANEL - Abnormal; Notable for the following:    Sodium 155 (*)    Chloride 113 (*)    Glucose, Bld 199 (*)    BUN 44 (*)    Creatinine, Ser 1.17 (*)    Albumin 2.4 (*)    GFR calc non Af Amer 39 (*)    GFR calc Af Amer 46 (*)    All other components within normal limits  LACTIC ACID, PLASMA - Abnormal; Notable for the following:  Lactic Acid, Venous 2.9 (*)    All other components within normal limits  URINALYSIS, ROUTINE W REFLEX MICROSCOPIC - Abnormal; Notable for the following:     Color, Urine ORANGE (*)    APPearance CLOUDY (*)    Hgb urine dipstick SMALL (*)    Bilirubin Urine MODERATE (*)    Ketones, ur 15 (*)    Protein, ur 30 (*)    Leukocytes, UA MODERATE (*)    All other components within normal limits  GLUCOSE, CAPILLARY - Abnormal; Notable for the following:    Glucose-Capillary 222 (*)    All other components within normal limits  URINE MICROSCOPIC-ADD ON - Abnormal; Notable for the following:    Bacteria, UA FEW (*)    Casts HYALINE CASTS (*)    All other components within normal limits  CULTURE, BLOOD (ROUTINE X 2)  CULTURE, BLOOD (ROUTINE X 2)  URINE CULTURE  INFLUENZA PANEL BY PCR (TYPE A & B, H1N1)   Imaging Review Ct Head Wo Contrast  10/01/2013   CLINICAL DATA:  Increasing lethargy.  EXAM: CT HEAD WITHOUT CONTRAST  TECHNIQUE: Contiguous axial images were obtained from the base of the skull through the vertex without intravenous contrast.  COMPARISON:  09/19/2013.  FINDINGS: The right lenticular nucleus hemorrhage with extension into the right corona radiata is less dense than on the prior examination consistent with red cell lysis cysts with surrounding vasogenic edema. Although slow, this represent expected evolution of intracranial hemorrhage. Mass-effect upon the right ventricle has decreased in the interim.  MR detected tiny amount hemorrhage left parietal lobe not appreciated on present CT.  Remote right occipital lobe infarct.  Small vessel disease type changes without CT evidence of large acute thrombotic infarct.  No intracranial mass lesion separate from above described findings.  Atrophy. Ventricular prominence probably related to atrophy rather than hydrocephalus  Tiny amount of gas in the left cavernous sinus region may be related to IV.  IMPRESSION: The right lenticular nucleus hemorrhage with extension into the right corona radiata is less dense than on the prior examination consistent with red cell lysis cysts with surrounding vasogenic  edema. Although slow, this represent expected evolution of intracranial hemorrhage. Mass-effect upon the right ventricle has decreased in the interim.   Electronically Signed   By: Bridgett Larsson M.D.   On: 10/01/2013 12:19   Dg Chest Port 1 View  10/01/2013   CLINICAL DATA:  Fever and cerebral infarction.  EXAM: PORTABLE CHEST - 1 VIEW  COMPARISON:  09/16/2013  FINDINGS: There is stable cardiomegaly. No overt edema, infiltrate or pleural fluid is identified. The visualized bony thorax is unremarkable.  IMPRESSION: No acute infiltrate identified.  Stable cardiomegaly.   Electronically Signed   By: Irish Lack M.D.   On: 10/01/2013 11:46    EKG Interpretation    Date/Time:  Thursday October 01 2013 10:09:52 EST Ventricular Rate:  126 PR Interval:    QRS Duration: 75 QT Interval:  305 QTC Calculation: 441 R Axis:   20 Text Interpretation:  Atrial fibrillation Probable anterior infarct, age indeterminate No significant change since last tracing Confirmed by HARRISON  MD, FORREST ([id number]) on 10/01/2013 10:23:55 AM            MDM  80 yo F with history of DM, atrial fibrillation, recent hemorrhagic stroke, CHF and hypothyroidism, who presents with altered mental status. Febrile to 100.9, tachycardic and hypotensive, concern for sepsis initially. Code sepsis initiated. Empirically started on Vanc/Zosyn given recent admission. CT  head shows improving IPH, doubt AMS is related to recent CVA. Work up shows possible UTI, WBC 16, Hgb 12.4. Na 155, Cl 113, Cr 1.1 (baseline 0.7), lactic acid 2.9. She is dehydrated on exam, so given 1 L NS over 4 hours. Her HR improved to the 110's and blood pressure responded well. Her CXR was without evidence of PNA. Influenza negative. She is likely bacteremic from urinary source or decubitus ulcer. Given her improving vital signs she was admitted to step down. Admitted by the teaching service. Her GCS remained 13-14, continued to protect her airway, no respiratory  distress so she was not intubated. I spoke to the patient's family about her with diagnosis and need for inpatient admission, they were in agreement with plan.  Reviewed imaging, labs, ECG and previous medical records, utilized in MDM  Discussed case with Dr. Romeo Apple  Clinical Impression 1. Severe sepsis secondary urinary tract infection 2. Hypernatremia 3. Acute on chronic kidney injury     Margie Billet, MD 10/01/13 [phone]

## 2013-10-01 NOTE — ED Notes (Signed)
TO ED via [facility] from home-- family states has increasing lethargy since yesterday--grandson states had physical therapy yesterday and went to bed- and today was not as responsive this am. IV left forearm per EMS-- Cardizem 10mg  given per EMS for heart rate of 160. On arrival heart rate= 120.

## 2013-10-01 NOTE — ED Notes (Signed)
Pt orally suctioned with sparse amount of thick white sputum.

## 2013-10-01 NOTE — ED Notes (Signed)
Attempted to call report.  RN Miachel Roux off floor transporting pt.

## 2013-10-02 DIAGNOSIS — L8995 Pressure ulcer of unspecified site, unstageable: Secondary | ICD-10-CM

## 2013-10-02 DIAGNOSIS — I2510 Atherosclerotic heart disease of native coronary artery without angina pectoris: Secondary | ICD-10-CM

## 2013-10-02 DIAGNOSIS — E039 Hypothyroidism, unspecified: Secondary | ICD-10-CM

## 2013-10-02 DIAGNOSIS — M069 Rheumatoid arthritis, unspecified: Secondary | ICD-10-CM

## 2013-10-02 DIAGNOSIS — E870 Hyperosmolality and hypernatremia: Secondary | ICD-10-CM

## 2013-10-02 DIAGNOSIS — E876 Hypokalemia: Secondary | ICD-10-CM

## 2013-10-02 DIAGNOSIS — I10 Essential (primary) hypertension: Secondary | ICD-10-CM

## 2013-10-02 DIAGNOSIS — I6350 Cerebral infarction due to unspecified occlusion or stenosis of unspecified cerebral artery: Secondary | ICD-10-CM

## 2013-10-02 DIAGNOSIS — A419 Sepsis, unspecified organism: Principal | ICD-10-CM

## 2013-10-02 DIAGNOSIS — E43 Unspecified severe protein-calorie malnutrition: Secondary | ICD-10-CM | POA: Diagnosis present

## 2013-10-02 DIAGNOSIS — I4891 Unspecified atrial fibrillation: Secondary | ICD-10-CM

## 2013-10-02 DIAGNOSIS — D696 Thrombocytopenia, unspecified: Secondary | ICD-10-CM

## 2013-10-02 DIAGNOSIS — M109 Gout, unspecified: Secondary | ICD-10-CM

## 2013-10-02 DIAGNOSIS — E41 Nutritional marasmus: Secondary | ICD-10-CM

## 2013-10-02 DIAGNOSIS — E119 Type 2 diabetes mellitus without complications: Secondary | ICD-10-CM

## 2013-10-02 DIAGNOSIS — I5020 Unspecified systolic (congestive) heart failure: Secondary | ICD-10-CM

## 2013-10-02 DIAGNOSIS — L89109 Pressure ulcer of unspecified part of back, unspecified stage: Secondary | ICD-10-CM

## 2013-10-02 LAB — BASIC METABOLIC PANEL
BUN: 43 mg/dL — AB (ref 6–23)
BUN: 37 mg/dL — ABNORMAL HIGH (ref 6–23)
CALCIUM: 8.7 mg/dL (ref 8.4–10.5)
CALCIUM: 8.6 mg/dL (ref 8.4–10.5)
CHLORIDE: 120 meq/L — AB (ref 96–112)
CHLORIDE: 115 meq/L — AB (ref 96–112)
CO2: 23 mEq/L (ref 19–32)
CO2: 20 meq/L (ref 19–32)
CREATININE: 0.97 mg/dL (ref 0.50–1.10)
CREATININE: 0.79 mg/dL (ref 0.50–1.10)
GFR calc Af Amer: 82 mL/min — ABNORMAL LOW (ref >90)
GFR calc non Af Amer: 71 mL/min — ABNORMAL LOW (ref >90)
GFR, EST AFRICAN AMERICAN: 57 mL/min — AB (ref >90)
GFR, EST NON AFRICAN AMERICAN: 50 mL/min — AB (ref >90)
Glucose, Bld: 187 mg/dL — ABNORMAL HIGH (ref 70–99)
Glucose, Bld: 370 mg/dL — ABNORMAL HIGH (ref 70–99)
Potassium: 3.2 mEq/L — ABNORMAL LOW (ref 3.7–5.3)
Potassium: 3.7 mEq/L (ref 3.7–5.3)
Sodium: 158 mEq/L — ABNORMAL HIGH (ref 137–147)
Sodium: 151 mEq/L — ABNORMAL HIGH (ref 137–147)

## 2013-10-02 LAB — CBC
HCT: 30.9 % — ABNORMAL LOW (ref 36.0–46.0)
HEMATOCRIT: 33.3 % — AB (ref 36.0–46.0)
Hemoglobin: 10.2 g/dL — ABNORMAL LOW (ref 12.0–15.0)
Hemoglobin: 10.9 g/dL — ABNORMAL LOW (ref 12.0–15.0)
MCH: 31.4 pg (ref 26.0–34.0)
MCH: 31.8 pg (ref 26.0–34.0)
MCHC: 32.7 g/dL (ref 30.0–36.0)
MCHC: 33 g/dL (ref 30.0–36.0)
MCV: 96 fL (ref 78.0–100.0)
MCV: 96.3 fL (ref 78.0–100.0)
PLATELETS: 149 10*3/uL — AB (ref 150–400)
PLATELETS: 137 10*3/uL — AB (ref 150–400)
RBC: 3.47 MIL/uL — ABNORMAL LOW (ref 3.87–5.11)
RBC: 3.21 MIL/uL — AB (ref 3.87–5.11)
RDW: 16.4 % — ABNORMAL HIGH (ref 11.5–15.5)
RDW: 16.6 % — ABNORMAL HIGH (ref 11.5–15.5)
WBC: 15.6 10*3/uL — ABNORMAL HIGH (ref 4.0–10.5)
WBC: 13.7 10*3/uL — AB (ref 4.0–10.5)

## 2013-10-02 LAB — COMPREHENSIVE METABOLIC PANEL
ALBUMIN: 1.9 g/dL — AB (ref 3.5–5.2)
ALT: 10 U/L (ref 0–35)
AST: 20 U/L (ref 0–37)
Alkaline Phosphatase: 67 U/L (ref 39–117)
BILIRUBIN TOTAL: 0.7 mg/dL (ref 0.3–1.2)
BUN: 45 mg/dL — AB (ref 6–23)
CHLORIDE: 118 meq/L — AB (ref 96–112)
CO2: 22 mEq/L (ref 19–32)
CREATININE: 1.02 mg/dL (ref 0.50–1.10)
Calcium: 8.5 mg/dL (ref 8.4–10.5)
GFR calc Af Amer: 54 mL/min — ABNORMAL LOW (ref >90)
GFR calc non Af Amer: 47 mL/min — ABNORMAL LOW (ref >90)
Glucose, Bld: 195 mg/dL — ABNORMAL HIGH (ref 70–99)
Potassium: 3.4 mEq/L — ABNORMAL LOW (ref 3.7–5.3)
Sodium: 155 mEq/L — ABNORMAL HIGH (ref 137–147)
Total Protein: 5.1 g/dL — ABNORMAL LOW (ref 6.0–8.3)

## 2013-10-02 LAB — GLUCOSE, CAPILLARY: Glucose-Capillary: 169 mg/dL — ABNORMAL HIGH (ref 70–99)

## 2013-10-02 LAB — TROPONIN I
TROPONIN I: 0.53 ng/mL — AB (ref <0.30)
Troponin I: 0.46 ng/mL (ref <0.30)

## 2013-10-02 LAB — PROTIME-INR
INR: 1.55 — ABNORMAL HIGH (ref 0.00–1.49)
Prothrombin Time: 18.2 seconds — ABNORMAL HIGH (ref 11.6–15.2)

## 2013-10-02 LAB — LACTIC ACID, PLASMA: Lactic Acid, Venous: 1.8 mmol/L (ref 0.5–2.2)

## 2013-10-02 LAB — OSMOLALITY, URINE: OSMOLALITY UR: 689 mosm/kg (ref 390–1090)

## 2013-10-02 MED ORDER — ENSURE PUDDING PO PUDG
1.0000 | Freq: Three times a day (TID) | ORAL | Status: DC
  Administered 2013-10-02 – 2013-10-08 (×15): 1 via ORAL

## 2013-10-02 MED ORDER — ALLOPURINOL 100 MG PO TABS
100.0000 mg | ORAL_TABLET | Freq: Every day | ORAL | Status: DC
  Administered 2013-10-02 – 2013-10-08 (×6): 100 mg via ORAL
  Filled 2013-10-02 (×7): qty 1

## 2013-10-02 MED ORDER — POTASSIUM CHLORIDE 2 MEQ/ML IV SOLN
INTRAVENOUS | Status: DC
Start: 2013-10-02 — End: 2013-10-04
  Filled 2013-10-02 (×4): qty 1000

## 2013-10-02 MED ORDER — COLLAGENASE 250 UNIT/GM EX OINT
TOPICAL_OINTMENT | Freq: Every day | CUTANEOUS | Status: DC
  Administered 2013-10-02 – 2013-10-04 (×3): via TOPICAL
  Administered 2013-10-05: 1 via TOPICAL
  Administered 2013-10-07 – 2013-10-08 (×2): via TOPICAL
  Filled 2013-10-02: qty 30

## 2013-10-02 MED ORDER — POTASSIUM CHLORIDE 2 MEQ/ML IV SOLN
INTRAVENOUS | Status: DC
  Administered 2013-10-02 (×3): via INTRAVENOUS
  Filled 2013-10-02: qty 1000

## 2013-10-02 NOTE — Progress Notes (Signed)
Utilization review completed. Dakotah Orrego, RN, BSN. 

## 2013-10-02 NOTE — Evaluation (Signed)
Clinical/Bedside Swallow Evaluation Patient Details  Name: [name] MRN: [id number] Date of Birth: 11-08-1920  Today's Date: 10/02/2013 Time: 0569-7948 SLP Time Calculation (min): 27 min  Past Medical History:  Past Medical History  Diagnosis Date  . Diabetes mellitus   . Glaucoma   . A-fib   . Myocardial infarction     3.5x63mm Veriflex stent 02/03/2012  . Coronary artery disease   . Hypothyroidism   . CHF (congestive heart failure)   . Arthritis     RA  . Stroke 09/2013   Past Surgical History:  Past Surgical History  Procedure Laterality Date  . Eye surgery    . Cardiac catheterization  02/03/12  . Tonsillectomy     HPI:  Pt is a 80 yo F with h/o recent hemorrhagic stroke (right lenticular nucleus hemorrhage with extension into the right corona radiata), CHF, T2DM who presents with increased lethargy. Pt found to have Sepsis possibly secondary to UTI. MD notes state Pt also has history of aspiration, but given clear CXR, aspiration pneumonia is unlikely source of infection. MBS on 1/8 recommended puree and pudding. Pt remained lethargic during admission and was d/c'd before demosntrating any functional gains. Grandaughter reports she was consuming pureed solids and honey thick liquids at home but speech therapist was trialing upgraded textures with success.    Assessment / Plan / Recommendation Clinical Impression  Pt demonstrates continued dysphagia, consistent with presentation during recent admission for CVA. Pt is mildly improved in initiation time. Observed to tolerate honey thick liquids and purees. Hopeful for potential to upgrade diet prior to d/c. Will plan for MBS on Monday expecting improved mental status at that time and increased success during objective testing. If pt d/c's over weekend, try for MBS then.     Aspiration Risk  Moderate    Diet Recommendation Dysphagia 1 (Puree);Honey-thick liquid   Liquid Administration via: Cup Medication  Administration: Crushed with puree Supervision: Full supervision/cueing for compensatory strategies;Staff to assist with self feeding;Trained caregiver to feed patient Compensations: Slow rate;Small sips/bites;Check for anterior loss;Clear throat intermittently Postural Changes and/or Swallow Maneuvers: Seated upright 90 degrees;Upright 30-60 min after meal    Other  Recommendations Recommended Consults: MBS Oral Care Recommendations: Oral care BID Other Recommendations: Order thickener from pharmacy   Follow Up Recommendations  Home health SLP    Frequency and Duration min 2x/week  2 weeks   Pertinent Vitals/Pain NA    SLP Swallow Goals     Swallow Study Prior Functional Status       General HPI: Pt is a 80 yo F with h/o recent hemorrhagic stroke (right lenticular nucleus hemorrhage with extension into the right corona radiata), CHF, T2DM who presents with increased lethargy. Pt found to have Sepsis possibly secondary to UTI. MD notes state Pt also has history of aspiration, but given clear CXR, aspiration pneumonia is unlikely source of infection. MBS on 1/8 recommended puree and pudding. Pt remained lethargic during admission and was d/c'd before demosntrating any functional gains. Grandaughter reports she was consuming pureed solids and honey thick liquids at home but speech therapist was trialing upgraded textures with success.  Type of Study: Bedside swallow evaluation Previous Swallow Assessment: BSE 1/7 Diet Prior to this Study: NPO Temperature Spikes Noted: No Respiratory Status: Room air History of Recent Intubation: No Behavior/Cognition: Requires cueing;Lethargic Oral Cavity - Dentition: Edentulous Self-Feeding Abilities: Total assist Patient Positioning: Upright in bed Baseline Vocal Quality: Clear Volitional Cough: Weak Volitional Swallow: Able to elicit  Oral/Motor/Sensory Function Overall Oral Motor/Sensory Function: Impaired at baseline Labial ROM: Reduced  left Labial Symmetry: Abnormal symmetry left Labial Strength: Reduced Lingual ROM: Within Functional Limits Lingual Symmetry: Within Functional Limits Lingual Strength: Within Functional Limits Lingual Sensation: Within Functional Limits Facial ROM: Reduced left Facial Symmetry: Left droop Facial Strength: Reduced   Ice Chips Ice chips: Impaired Presentation: Spoon Oral Phase Functional Implications: Left anterior spillage Pharyngeal Phase Impairments: Suspected delayed Swallow   Thin Liquid Thin Liquid: Impaired Presentation: Cup Oral Phase Impairments: Reduced labial seal Oral Phase Functional Implications: Left anterior spillage Pharyngeal  Phase Impairments: Suspected delayed Swallow;Cough - Delayed;Wet Vocal Quality;Multiple swallows    Nectar Thick Nectar Thick Liquid: Not tested   Honey Thick Honey Thick Liquid: Impaired Presentation: Cup Pharyngeal Phase Impairments: Suspected delayed Swallow   Puree Puree: Impaired Pharyngeal Phase Impairments: Suspected delayed Swallow   Solid   GO    Solid: Not tested      Harlon Ditty, MA CCC-SLP [phone]  Claudine Mouton 10/02/2013,9:46 AM

## 2013-10-02 NOTE — H&P (Signed)
I was present during the history and physical obtained from [facility]. I have read and agree with her note. Please see my separate note as well.

## 2013-10-02 NOTE — Progress Notes (Addendum)
INITIAL NUTRITION ASSESSMENT  DOCUMENTATION CODES Per approved criteria  -Severe malnutrition in the context of chronic illness   INTERVENTION: Ensure Pudding po TID, each supplement provides 170 kcal and 4 grams of protein RD to follow for nutrition care plan  NUTRITION DIAGNOSIS: Increased nutrient needs related to malnutrition, wound healing as evidenced by estimated nutrition needs  Goal: Pt to meet >/= 90% of their estimated nutrition needs   Monitor:  PO & supplemental intake, weight, labs, I/O's  Reason for Assessment: Low Braden  80 y.o. female  Admitting Dx: Sepsis  ASSESSMENT: Patient with PMH of recent hemorrhagic stroke, CHF, atrial fibrillation, anterior wall MI, DM, glaucoma, hypothyroidism who presented for lethargy.  RD unable to obtain nutrition hx; no family at bedside; per H&P patient has been eating less than her baseline; patient with visible severe muscle wasting to upper body; s/p bedside swallow evaluation this AM; wound care note reviewed; RD spoke with Melody, CWOCN regarding care plan; RD to order nutrition supplements.  Nutrition Focused Physical Exam:  Subcutaneous Fat:  Orbital Region: severe depletion Upper Arm Region: moderate depletion Thoracic and Lumbar Region: N/A  Muscle:  Temple Region: severe depletion Clavicle Bone Region: severe depletion Clavicle and Acromion Bone Region: severe depletion Scapular Bone Region: N/A Dorsal Hand: N/A Patellar Region: N/A Anterior Thigh Region: N/A Posterior Calf Region: N/A  Edema: none  Patient meets criteria for severe malnutrition in the context of chronic illness as evidenced by severe muscle loss & severe subcutaneous fat loss.  Height: Ht Readings from Last 1 Encounters:  10/01/13 5' 4.17" (1.63 m)    Weight: Wt Readings from Last 1 Encounters:  10/02/13 126 lb 8.7 oz (57.4 kg)    Ideal Body Weight: 120 lb  % Ideal Body Weight: 105%  Wt Readings from Last 10 Encounters:   10/02/13 126 lb 8.7 oz (57.4 kg)  09/17/13 125 lb 6.4 oz (56.881 kg)  03/15/13 129 lb 3.2 oz (58.605 kg)  02/03/12 139 lb 12.4 oz (63.4 kg)  02/03/12 139 lb 12.4 oz (63.4 kg)    Usual Body Weight: 125 lb  % Usual Body Weight: 101%  BMI:  Body mass index is 21.6 kg/(m^2).  Estimated Nutritional Needs: Kcal: 1400-1600 Protein: 70-80 gm Fluid: >/= 1.5 L  Skin: unstageable sacral pressure ulcer, SDTI to R & L heels  Diet Order: Dysphagia  EDUCATION NEEDS: -No education needs identified at this time   Intake/Output Summary (Last 24 hours) at 10/02/13 1040 Last data filed at 10/02/13 1018  Gross per 24 hour  Intake 1833.5 ml  Output    405 ml  Net 1428.5 ml    Labs:   Recent Labs Lab 10/01/13 2020 10/02/13 0025 10/02/13 0745  NA 156* 155* 158*  K 3.4* 3.4* 3.2*  CL 118* 118* 120*  CO2 21 22 23   BUN 46* 45* 43*  CREATININE 1.11* 1.02 0.97  CALCIUM 8.3* 8.5 8.7  GLUCOSE 208* 195* 187*    CBG (last 3)   Recent Labs  10/01/13 1100 10/02/13 0802  GLUCAP 222* 169*    Scheduled Meds: . allopurinol  100 mg Oral Daily  . brimonidine  1 drop Both Eyes Q12H   And  . timolol  1 drop Both Eyes BID  . brinzolamide  1 drop Both Eyes TID  . collagenase   Topical Daily  . latanoprost  1 drop Both Eyes QHS  . levothyroxine  125 mcg Oral QAC breakfast  . piperacillin-tazobactam (ZOSYN)  IV  3.375 g  Intravenous Q8H  . prednisoLONE acetate  1 drop Right Eye BID  . simvastatin  10 mg Oral q1800  . sodium chloride  3 mL Intravenous Q12H  . vancomycin  500 mg Intravenous Q24H  . vitamin B-12  1,000 mcg Oral Daily    Continuous Infusions: . dextrose 5 % 1,000 mL with potassium chloride 20 mEq infusion      Past Medical History  Diagnosis Date  . Diabetes mellitus   . Glaucoma   . A-fib   . Myocardial infarction     3.5x58mm Veriflex stent 02/03/2012  . Coronary artery disease   . Hypothyroidism   . CHF (congestive heart failure)   . Arthritis     RA  .  Stroke 09/2013    Past Surgical History  Procedure Laterality Date  . Eye surgery    . Cardiac catheterization  02/03/12  . Tonsillectomy      Maureen Chatters, RD, LDN Pager #: [phone] After-Hours Pager #: [phone]

## 2013-10-02 NOTE — Consult Note (Signed)
WOC wound consult note Reason for Consult: evaluate sacrum and heels Wound type: 1. Unstageable pressure ulcer sacrum 2. sDTI (suspected deep tissue injury) right and left heels Pressure Ulcer POA: Yes x 3 Measurement: 1. Sacrum: 10cm x9cm x 0.2cm  2. Right heel: 1.5cm x 1.0cm  3. Left heel: 3.5cm x 4.0cm  Wound bed: Sacrum 90% maroon purple wet partial thickness skin loss, 10% open pink but dusky tissue, one area laterally on the left buttock open and 100% pink Drainage (amount, consistency, odor) moderate serousanginous from the sacrum, with odor. The left and right heel are deep tissue injuries that are not open currently.  Periwound: intact Order: add air mattress for pressure redistribution, with recent CVA pt is not moving in the bed.  Prevalon boots to offload bilateral heel pressure ulcers, no topical care for these area needed at this time.  Add enzymatic debridement ointment for the sacrum and moist gauze to promote debridement process. Should GOC be established and a more palliative approach be desired please contact WOC nurse and we will update wound care orders.   Contacted daughter who is primary caregiver with update on wounds and orders.  Discussed POC with caregiver and bedside nurse.  Re consult if needed, will not follow at this time. Thanks  Shelda Truby [employer], CWOCN [phone])

## 2013-10-02 NOTE — H&P (Signed)
Internal Medicine Attending Admission Note Date: 10/02/2013  Patient name: [name] Medical record number: [id number] Date of birth: 10/19/1920 Age: 80 y.o. Gender: female  I saw and evaluated the patient. I reviewed the resident's note and I agree with the resident's findings and plan as documented in the resident's note, with the following additional comments.  Chief Complaint(s): Altered mental status  History - key components related to admission: Patient is a 80 year old woman with history of hypertension, hyperlipidemia, type 2 diabetes mellitus, systolic heart failure, coronary artery disease status post MI and stent, atrial fibrillation, CVA in January of 2015, and other problems as outlined in the medical history, admitted with altered mental status.   Physical Exam - key components related to admission:  Filed Vitals:   10/02/13 0425 10/02/13 0759 10/02/13 1146 10/02/13 1228  BP: 103/49 109/48 78/41 90/40   Pulse: 92 87 78 73  Temp: 97.4 F (36.3 C) 96.8 F (36 C) 97.4 F (36.3 C)   TempSrc: Other (Comment) Core (Comment) Axillary   Resp: 25 27 22    Height:      Weight: 126 lb 8.7 oz (57.4 kg)     SpO2: 99% 100% 96% 100%   General: Awake, answers a few questions Lungs: Clear  Heart: Irregularly irregular; no extra sounds or murmurs Abdomen: Bowel sounds present, soft, nontender Extremities: No edema  Lab results:   Basic Metabolic Panel:  Recent Labs  0025 10/02/13 0745  NA 155* 158*  K 3.4* 3.2*  CL 118* 120*  CO2 22 23  GLUCOSE 195* 187*  BUN 45* 43*  CREATININE 1.02 0.97  CALCIUM 8.5 8.7    Liver Function Tests:  Recent Labs  10/01/13 1030 10/02/13 0025  AST 19 20  ALT 11 10  ALKPHOS 84 67  BILITOT 0.7 0.7  PROT 6.3 5.1*  ALBUMIN 2.4* 1.9*     CBC:  Recent Labs  10/02/13 0025 10/02/13 0745  WBC 13.7* 15.6*  HGB 10.2* 10.9*  HCT 30.9* 33.3*  MCV 96.3 96.0  PLT 137* 149*    Recent Labs  10/01/13 1030   NEUTROABS 13.6*  LYMPHSABS 1.8  MONOABS 0.6  EOSABS 0.0  BASOSABS 0.0    Cardiac Enzymes:  Recent Labs  10/01/13 1810 10/02/13 0025 10/02/13 0434  TROPONINI 0.73* 0.53* 0.46*    CBG:  Recent Labs  10/01/13 1100 10/02/13 0802  GLUCAP 222* 169*    Thyroid Function Tests:  Recent Labs  10/01/13 1810  TSH 0.664     Coagulation:  Recent Labs  10/02/13 0025  INR 1.55*    Urine Drug Screen: Drugs of Abuse     Component Value Date/Time   LABOPIA NONE DETECTED 09/15/2013 1709   COCAINSCRNUR NONE DETECTED 09/15/2013 1709   LABBENZ NONE DETECTED 09/15/2013 1709   AMPHETMU NONE DETECTED 09/15/2013 1709   THCU NONE DETECTED 09/15/2013 1709   LABBARB NONE DETECTED 09/15/2013 1709       Urinalysis    Component Value Date/Time   COLORURINE ORANGE* 10/01/2013 1127   APPEARANCEUR CLOUDY* 10/01/2013 1127   LABSPEC 1.024 10/01/2013 1127   PHURINE 5.0 10/01/2013 1127   GLUCOSEU NEGATIVE 10/01/2013 1127   HGBUR SMALL* 10/01/2013 1127   BILIRUBINUR MODERATE* 10/01/2013 1127   KETONESUR 15* 10/01/2013 1127   PROTEINUR 30* 10/01/2013 1127   UROBILINOGEN 1.0 10/01/2013 1127   NITRITE NEGATIVE 10/01/2013 1127   LEUKOCYTESUR MODERATE* 10/01/2013 1127    Urine microscopic:  Recent Labs  10/01/13 1127  EPIU RARE  WBCU  11-20  RBCU 0-2  BACTERIA FEW*  LABCAST HYALINE CASTS*      Imaging results:  Ct Head Wo Contrast  10/01/2013   CLINICAL DATA:  Increasing lethargy.  EXAM: CT HEAD WITHOUT CONTRAST  TECHNIQUE: Contiguous axial images were obtained from the base of the skull through the vertex without intravenous contrast.  COMPARISON:  09/19/2013.  FINDINGS: The right lenticular nucleus hemorrhage with extension into the right corona radiata is less dense than on the prior examination consistent with red cell lysis cysts with surrounding vasogenic edema. Although slow, this represent expected evolution of intracranial hemorrhage. Mass-effect upon the right ventricle has decreased  in the interim.  MR detected tiny amount hemorrhage left parietal lobe not appreciated on present CT.  Remote right occipital lobe infarct.  Small vessel disease type changes without CT evidence of large acute thrombotic infarct.  No intracranial mass lesion separate from above described findings.  Atrophy. Ventricular prominence probably related to atrophy rather than hydrocephalus  Tiny amount of gas in the left cavernous sinus region may be related to IV.  IMPRESSION: The right lenticular nucleus hemorrhage with extension into the right corona radiata is less dense than on the prior examination consistent with red cell lysis cysts with surrounding vasogenic edema. Although slow, this represent expected evolution of intracranial hemorrhage. Mass-effect upon the right ventricle has decreased in the interim.   Electronically Signed   By: Bridgett Larsson M.D.   On: 10/01/2013 12:19   Dg Chest Port 1 View  10/01/2013   CLINICAL DATA:  Fever and cerebral infarction.  EXAM: PORTABLE CHEST - 1 VIEW  COMPARISON:  09/16/2013  FINDINGS: There is stable cardiomegaly. No overt edema, infiltrate or pleural fluid is identified. The visualized bony thorax is unremarkable.  IMPRESSION: No acute infiltrate identified.  Stable cardiomegaly.   Electronically Signed   By: Irish Lack M.D.   On: 10/01/2013 11:46    Other results: EKG: Atrial fibrillation; probable anterior infarct, age indeterminate; no significant change since last tracing  Assessment & Plan by Problem:  1.  Sepsis, possibly due to urinary tract infection.  Plan is empiric broad-spectrum IV antibiotics pending results of blood and urine cultures; IV volume replacement; monitor closely in step down unit.  2.  Hypernatremia.  Plan is corrected with hypotonic IV fluid when volume replete; monitor; follow electrolytes and renal function.  3.  Acute kidney injury.  This is likely prerenal, and hopefully will improve with volume replacement.  ACE inhibitor  was held.  4.  Status post ischemic stroke with hemorrhagic conversion in January 2015.  Patient has chronic atrial fibrillation, but is currently not a candidate for antiplatelet or anticoagulant therapy as per neurology given the hyperdense appearance on head CT this admission.    5.  Elevated troponins.  No evidence of acute coronary syndrome by EKG; the troponins are improving.  Given her recent stroke with hemorrhagic conversion, cannot use antiplatelet or anticoagulant treatment.  Plan is monitor; supportive care.  6.  Other problems and plans as per the resident physician's note.

## 2013-10-02 NOTE — Progress Notes (Addendum)
Subjective:  Pt seen and examined in AM. No acute events overnight. Per daughter pt with left swollen hand today. Pt denies pain. No reported fever, dyspnea, cough, CP, nausea, vomiting, abdominal pain, or change in BM.  Per daughter she seems to be improving however still not back to baseline.    Objective: Vital signs in last 24 hours: Filed Vitals:   10/02/13 0425 10/02/13 0759 10/02/13 1146 10/02/13 1228  BP: 103/49 109/48 78/41 90/40   Pulse: 92 87 78 73  Temp: 97.4 F (36.3 C) 96.8 F (36 C) 97.4 F (36.3 C)   TempSrc: Other (Comment) Core (Comment) Axillary   Resp: 25 27 22    Height:      Weight: 57.4 kg (126 lb 8.7 oz)     SpO2: 99% 100% 96% 100%   Weight change:   Intake/Output Summary (Last 24 hours) at 10/02/13 1255 Last data filed at 10/02/13 1018  Gross per 24 hour  Intake 1833.5 ml  Output    405 ml  Net 1428.5 ml    Constitutional: She is oriented to person, place, and time. No distress.  Cachetic appearing  HENT:  Head: Normocephalic and atraumatic.  Right Ear: External ear normal.  Left Ear: External ear normal.  Nose: Nose normal.  Mouth/Throat: Oropharynx is clear and moist. No oropharyngeal exudate.  Eyes: Conjunctivae and EOM are normal. Pupils are equal, round, and reactive to light. Right eye exhibits no discharge. Left eye exhibits no discharge. No scleral icterus.  Neck: Normal range of motion. Neck supple.  Cardiovascular: Normal rate.  Irregular rhythm  Pulmonary/Chest: Effort normal and breath sounds normal.  Anterior breath fields clear  Abdominal: Soft. Bowel sounds are normal. She exhibits no distension. There is no tenderness. There is no rebound and no guarding.  Musculoskeletal: left MCP swelling with no overlying warmth or erythema  Neurological: She is alert and oriented to person, place, and time. No cranial nerve deficit.  Slow to respond to questions, no slurred speech. Left UE and LE 3/5 weakness, otherwise 4/5 throughout; No  sensation of left UE or LE.   Skin: Skin is warm and dry. She is not diaphoretic.  Sacral wound ulcer of approx 10 cm x 9 cm over gluteal cleft region without purulent discharge, odor, or appearance of underlying subcutaneous fat, tendon, or bone Left and right heel and left 5th digit ulceration     Lab Results: Basic Metabolic Panel:  Recent Labs Lab 10/02/13 0025 10/02/13 0745  NA 155* 158*  K 3.4* 3.2*  CL 118* 120*  CO2 22 23  GLUCOSE 195* 187*  BUN 45* 43*  CREATININE 1.02 0.97  CALCIUM 8.5 8.7   Liver Function Tests:  Recent Labs Lab 10/01/13 1030 10/02/13 0025  AST 19 20  ALT 11 10  ALKPHOS 84 67  BILITOT 0.7 0.7  PROT 6.3 5.1*  ALBUMIN 2.4* 1.9*   No results found for this basename: LIPASE, AMYLASE,  in the last 168 hours No results found for this basename: AMMONIA,  in the last 168 hours CBC:  Recent Labs Lab 10/01/13 1030 10/02/13 0025 10/02/13 0745  WBC 16.0* 13.7* 15.6*  NEUTROABS 13.6*  --   --   HGB 12.4 10.2* 10.9*  HCT 37.1 30.9* 33.3*  MCV 96.9 96.3 96.0  PLT 196 137* 149*   Cardiac Enzymes:  Recent Labs Lab 10/01/13 1810 10/02/13 0025 10/02/13 0434  TROPONINI 0.73* 0.53* 0.46*   BNP: No results found for this basename: PROBNP,  in the  last 168 hours D-Dimer: No results found for this basename: DDIMER,  in the last 168 hours CBG:  Recent Labs Lab 10/01/13 1100 10/02/13 0802  GLUCAP 222* 169*   Hemoglobin A1C: No results found for this basename: HGBA1C,  in the last 168 hours Fasting Lipid Panel: No results found for this basename: CHOL, HDL, LDLCALC, TRIG, CHOLHDL, LDLDIRECT,  in the last 168 hours Thyroid Function Tests:  Recent Labs Lab 10/01/13 1810  TSH 0.664   Coagulation:  Recent Labs Lab 10/02/13 0025  LABPROT 18.2*  INR 1.55*   Anemia Panel: No results found for this basename: VITAMINB12, FOLATE, FERRITIN, TIBC, IRON, RETICCTPCT,  in the last 168 hours Urine Drug Screen: Drugs of Abuse       Component Value Date/Time   LABOPIA NONE DETECTED 09/15/2013 1709   COCAINSCRNUR NONE DETECTED 09/15/2013 1709   LABBENZ NONE DETECTED 09/15/2013 1709   AMPHETMU NONE DETECTED 09/15/2013 1709   THCU NONE DETECTED 09/15/2013 1709   LABBARB NONE DETECTED 09/15/2013 1709    Alcohol Level: No results found for this basename: ETH,  in the last 168 hours Urinalysis:  Recent Labs Lab 10/01/13 1127  COLORURINE ORANGE*  LABSPEC 1.024  PHURINE 5.0  GLUCOSEU NEGATIVE  HGBUR SMALL*  BILIRUBINUR MODERATE*  KETONESUR 15*  PROTEINUR 30*  UROBILINOGEN 1.0  NITRITE NEGATIVE  LEUKOCYTESUR MODERATE*    Micro Results: Recent Results (from the past 240 hour(s))  CULTURE, BLOOD (ROUTINE X 2)     Status: None   Collection Time    10/01/13 10:30 AM      Result Value Range Status   Specimen Description BLOOD RIGHT FOREARM   Final   Special Requests BOTTLES DRAWN AEROBIC AND ANAEROBIC 5CCS   Final   Culture  Setup Time     Final   Value: 10/01/2013 18:19     Performed at [facility]   Culture     Final   Value:        BLOOD CULTURE RECEIVED NO GROWTH TO DATE CULTURE WILL BE HELD FOR 5 DAYS BEFORE ISSUING A FINAL NEGATIVE REPORT     Performed at [facility]   Report Status PENDING   Incomplete  CULTURE, BLOOD (ROUTINE X 2)     Status: None   Collection Time    10/01/13 11:10 AM      Result Value Range Status   Specimen Description BLOOD LEFT ANTECUBITAL   Final   Special Requests BOTTLES DRAWN AEROBIC AND ANAEROBIC 5CC   Final   Culture  Setup Time     Final   Value: 10/01/2013 18:19     Performed at [facility]   Culture     Final   Value:        BLOOD CULTURE RECEIVED NO GROWTH TO DATE CULTURE WILL BE HELD FOR 5 DAYS BEFORE ISSUING A FINAL NEGATIVE REPORT     Performed at [facility]   Report Status PENDING   Incomplete  URINE CULTURE     Status: None   Collection Time    10/01/13 11:27 AM      Result Value Range Status   Specimen Description URINE,  CATHETERIZED   Final   Special Requests NONE   Final   Culture  Setup Time     Final   Value: 10/01/2013 18:21     Performed at [facility]   Colony Count     Final   Value: >=100,000 COLONIES/ML  Performed at [facility]     Final   Value: ESCHERICHIA COLI     Performed at [facility]   Report Status PENDING   Incomplete   Studies/Results: Ct Head Wo Contrast  10/01/2013   CLINICAL DATA:  Increasing lethargy.  EXAM: CT HEAD WITHOUT CONTRAST  TECHNIQUE: Contiguous axial images were obtained from the base of the skull through the vertex without intravenous contrast.  COMPARISON:  09/19/2013.  FINDINGS: The right lenticular nucleus hemorrhage with extension into the right corona radiata is less dense than on the prior examination consistent with red cell lysis cysts with surrounding vasogenic edema. Although slow, this represent expected evolution of intracranial hemorrhage. Mass-effect upon the right ventricle has decreased in the interim.  MR detected tiny amount hemorrhage left parietal lobe not appreciated on present CT.  Remote right occipital lobe infarct.  Small vessel disease type changes without CT evidence of large acute thrombotic infarct.  No intracranial mass lesion separate from above described findings.  Atrophy. Ventricular prominence probably related to atrophy rather than hydrocephalus  Tiny amount of gas in the left cavernous sinus region may be related to IV.  IMPRESSION: The right lenticular nucleus hemorrhage with extension into the right corona radiata is less dense than on the prior examination consistent with red cell lysis cysts with surrounding vasogenic edema. Although slow, this represent expected evolution of intracranial hemorrhage. Mass-effect upon the right ventricle has decreased in the interim.   Electronically Signed   By: Bridgett Larsson M.D.   On: 10/01/2013 12:19   Dg Chest Port 1 View  10/01/2013   CLINICAL DATA:  Fever and  cerebral infarction.  EXAM: PORTABLE CHEST - 1 VIEW  COMPARISON:  09/16/2013  FINDINGS: There is stable cardiomegaly. No overt edema, infiltrate or pleural fluid is identified. The visualized bony thorax is unremarkable.  IMPRESSION: No acute infiltrate identified.  Stable cardiomegaly.   Electronically Signed   By: Irish Lack M.D.   On: 10/01/2013 11:46   Medications: I have reviewed the patient's current medications. Scheduled Meds: . allopurinol  100 mg Oral Daily  . brimonidine  1 drop Both Eyes Q12H   And  . timolol  1 drop Both Eyes BID  . brinzolamide  1 drop Both Eyes TID  . collagenase   Topical Daily  . feeding supplement (ENSURE)  1 Container Oral TID BM  . latanoprost  1 drop Both Eyes QHS  . levothyroxine  125 mcg Oral QAC breakfast  . piperacillin-tazobactam (ZOSYN)  IV  3.375 g Intravenous Q8H  . prednisoLONE acetate  1 drop Right Eye BID  . simvastatin  10 mg Oral q1800  . sodium chloride  3 mL Intravenous Q12H  . vancomycin  500 mg Intravenous Q24H  . vitamin B-12  1,000 mcg Oral Daily   Continuous Infusions: . dextrose 5 % 1,000 mL with potassium chloride 20 mEq infusion 75 mL/hr at 10/02/13 1250   PRN Meds:.acetaminophen, acetaminophen, docusate sodium, nitroGLYCERIN, ondansetron (ZOFRAN) IV, ondansetron, RESOURCE THICKENUP CLEAR Assessment/Plan:  Assessment: 80 year old woman with past medical history of hypertension, hyperlipidemia, non-insulin Type 2 DM, systolic CHF (30-35% on 09/26/13), CAD s/p MI and stent, atrial fibrillation, CVA Jan 215, Hypothyroidism on replacement, rheumatoid arthritis, and gout who presents with altered mental status per family and found to be in sepsis.   Plan:   Sepsis most likely due to E. Coli UTI -  Pt presented with SIRS+4 (T 100.9, HR 122, RR 28, WBC 16K  with neutrophilia) and found to be hypotensive with blood pressure of 80/33 requiring fluid resuscitation. Lactic acid was elevated at 2.9 on admission, now normalized. Source  of infection unclear but most likely due to UTI (UA with moderate LE, hematuria) vs sacral pressure ulcer (Stage 2 ulcer). Pt was found to be influenza negative with no acute cardiopulmonary disease on chest xray.  -Administer fluid bolus (NS to 1/2 NS) as needed for hypotension, hold anti-hypertensives  -Obtain blood cultures x 2 --> NGTD -Awaiting urine cultures --> >100K E. Coli, sensitivities pending -Appreciate wound care consult  -Continue Day 2 of broad spectrum coverage with IV vancomycin and zosyn per pharmacy, consider transition to IV ceftriaxone for E. Coli UTI -Monitor leukocytosis --> 15.6K to      Hypertonic Hypernatremia - Serum osmolarity of 336, urine osmolality of 689 with sodium on admission of 155. Most likely due to hypovolemia in setting of hypotension.  -Administer NS or 0.5NS with correction of Na of 10-12 mEq in 24 hr period -Continue to monitor   Elevated Troponin - Pt with elevated troponin of 0.73 to 0.53 to 0.46 most likely due to demand ischemia in setting of sepsis. Pt also with history of MI s/p stent on 02/03/12.  -Pt is not candidate for [facility] or AP therapy -Supportive care   Unstageable Sacral Pressure Ulcer - Pt with unstagable pressure wound ulcer without subcutaneous fat/ tendon/bone penetration of 1 week duration due to bed bound status after ischemic stroke prior this month.  -Appreciate wound care consult --> add air mattress, prevalon boots, enzymatic debridement ointment and moist gauze   Hypokalemia - Pt with mild potassium of 3.4 most likely due to decreased PO intake vs hypomagnesemia  -Obtain Mg level -Replete with KCl as needed -Continue to monitor  Anion Gap - improving. Pt with AG of 20 on admission most likely due to lactic acidosis due to sepsis, now 15 -Continue to monitor  -Obtain lactic acid ---> wnl  Hypoalbuminemia and severe malnutrition  - Pt with severe malnutrition in the context of chronic illness and albumin of 1.9.  -Appreciate  nutrition consult -Ensure pudding PO TID   Atrial Fibrillation - currently in atrial fibrillation on carvedilol 6.25 mg BID and no AC therapy  -Hold carvedilol 6.25 mg BID in setting  -Place SCDs  -Consider neurology consult for recommendations for AP or AC therapy --> per Dr. Roseanne Reno not isodense, so will not start AP or AC therapy   Systolic Congestive Failure - Last 2D-echo on 09/16/13 with reduced EF of 30-35% and Akinesis of the entireanterior myocardium. LV diastolic could not be assessed. -Obtain daily weight (125 to 126 lb) and I &O's (405 mL urine output yesterday) -Caution with IVF's   Ischemic Stroke with hemorrhagic conversion - Pt with hemorrhagic conversion of a right lentiform nucleus infarct s/p IV tPA, infarct embolic secondary to atrial fibrillation during last hospitalization 1/6-1/12.  -CT Head w/o contrast ---> expected evolution of intracranial hemorrhage  -Consider neurology consult for recommendations for AP or AC therapy --> per Dr. Roseanne Reno not isodense, so will not start AP or AC therapy  -Hold AC or AP therapy for now  -Obtain INR --> 1.55 -Speech Consult ---> dysphagia 1  Coronary Artery Disease - Pt with acute anterolateral myocardial infarction that occurred on 02/03/2012 with successful PTCA and stenting of the mid LAD with a non-drug-eluting 3.5 x 24 mm Veriflex stent.  -Continue home pravastatin 20 mg daily  -Nitroglycerin as needed for CP  -Obtain troponin  Non-insulin-Type II Diabetes Mellitus - CBG 169.  HbA1c on 6.5 on 09/16/13. Pt on metformin 500 mg BID. -Hold home metformin 500 mg BID  -Monitor glucose frequently  -Consider insulin sliding scale if uncontrolled  Hypertension - Currently normotensive  -Hold home rampiril 1.25 mg daily in setting of hypotension   Hypothyroidism - Last TSH 1.327 on 02/03/12.  -Continue levothyroxine 125 mcg daily  -Obtain TSH --> wnl  Rheumatoid Arthritis -currently with left hand swelling without joint pain or  erythema -Hold home methotrexate 10 mg weekly in setting of infection  Gout - currently stable without flare  -Continue allopurinol 100 mg daily   Chronic Thrombocytopenia - Pt with no active bleeding. Platelet count on admission of 196K with baseline 100-110K. -Continue to monitor -Hold AP and AC therapy   Acute Kidney Injury - resolved. Pt with last Cr was 0.78 on 1/10, on admission 1.17, now 0.97. Most likely due to pre-renal azotemia (hypolemia) as resolved with administration of IVF's and FeNa <1% -Hold home ramipril  -Obtain daily weight (125 to 126 lb) and I &O's (405 mL urine output yesterday) -Obtain UNa (39) and UCr (148.55) to calculate FeNa ---> 0.2% indicating pre-renal azotemia -Maintain foley catheter    Diet: Dysphagia 1  DVT PPx: SCDs  Code: Full     Dispo: Disposition is deferred at this time, awaiting improvement of current medical problems.  Anticipated discharge is unknown.   The patient does have a current PCP Milana Obey, MD) and does need an [facility] hospital follow-up appointment after discharge.  The patient does not have transportation limitations that hinder transportation to clinic appointments.  .Services Needed at time of discharge: Y = Yes, Blank = No PT:   OT:   RN:   Equipment:   Other:     LOS: 1 day   Otis Brace, MD 10/02/2013, 12:55 PM

## 2013-10-02 NOTE — Care Management Note (Addendum)
    Page 1 of 2   10/08/2013     10:43:46 AM   CARE MANAGEMENT NOTE 10/08/2013  Patient:  [name], [name]   Account Number:  [id number]  Date Initiated:  10/02/2013  Documentation initiated by:  Junius Creamer  Subjective/Objective Assessment:   adm w sepsis, recent cva     Action/Plan:   lives w fam, pcp dr Katharine Look, [facility] w [facility]   Anticipated DC Date:     Anticipated DC Plan:  HOME W HOME HEALTH SERVICES      DC Planning Services  CM consult      [facility] Choice  Resumption Of Svcs/PTA Provider   Choice offered to / List presented to:          [facility] arranged  HH-1 RN  HH-2 PT  HH-3 OT  HH-5 SPEECH THERAPY      HH agency  [facility]   Status of service:   Medicare Important Message given?   (If response is "NO", the following Medicare IM given date fields will be blank) Date Medicare IM given:   Date Additional Medicare IM given:    Discharge Disposition:  HOME W HOME HEALTH SERVICES  Per UR Regulation:  Reviewed for med. necessity/level of care/duration of stay  If discussed at Long Length of Stay Meetings, dates discussed:   10/06/2013    Comments:  Contact:  Adrian Saran Grandaughter [phone]  10-08-13 10:30am Avie Arenas, RNBSN [phone] [facility] saw this am - Updated physician of plan for dc today - Placed order for equipment - called [facility] laison for DME alert and change of address alert.  Will call grandaughter a little later to check on time of delivery.  10-07-13 4:21pm Nyhla Mountjoy, RNBSN [phone] Plan now for home with hospice. Choice offered - grandaughter.  Grandaughter would like [facility] - Referral made to [facility].  Goal is for dc tomorrow.  10-05-13  10:30am Mitchelle Sultan, RNBSN [phone] PT recommending SNF - Left message for grandaughter to call me back.  1/23 1220p debbie dowell rn,bsn pt act w [facility]. alerted donna w [facility] of adm. will cont to follow.

## 2013-10-02 NOTE — Progress Notes (Signed)
Recommend starting Novolog SENSITIVE correction scale AC & HS if CBGs continue greater than 180 mg/dl and while in the hospital.  Smith Mince RN BSN CDE

## 2013-10-02 NOTE — Progress Notes (Signed)
[facility]  Patient Status: Active (receiving services up to time of hospitalization)  [facility] is providing the following services: RN, PT, OT, ST and MSW  If patient discharges after hours, please call [phone].   Kizzie Furnish 10/02/2013, 12:26 PM

## 2013-10-02 NOTE — ED Provider Notes (Signed)
Medical screening examination/treatment/procedure(s) were conducted as a shared visit with resident physician and myself.  I personally evaluated the patient during the encounter.  EKG Interpretation    Date/Time:  Thursday October 01 2013 10:09:52 EST Ventricular Rate:  126 PR Interval:    QRS Duration: 75 QT Interval:  305 QTC Calculation: 441 R Axis:   20 Text Interpretation:  Atrial fibrillation Probable anterior infarct, age indeterminate No significant change since last tracing Confirmed by Larone Kliethermes  MD, Jacquel Mccamish ([id number]) on 10/01/2013 10:23:55 AM           I interviewed and examined the patient. Lungs are CTAB. Cardiac exam wnl, pt is tachycardic. Abdomen soft. Presumed sepsis based off VS (T100.9, HR 130, BP 80/33) initially. Pt also more lethargic w/ AMS. Code sepsis initiated and pt got Vanc/Zosyn empirically.  IVF given cautiously w/ multiple small fluid boluses d/t hx of CHF. Pt improved markedly w/ hydration. Presumed Urosepsis.   CRITICAL CARE Performed by: Purvis Sheffield, S Total critical care time: 30 min Critical care time was exclusive of separately billable procedures and treating other patients. Critical care was necessary to treat or prevent imminent or life-threatening deterioration. Critical care was time spent personally by me on the following activities: development of treatment plan with patient and/or surrogate as well as nursing, discussions with consultants, evaluation of patient's response to treatment, examination of patient, obtaining history from patient or surrogate, ordering and performing treatments and interventions, ordering and review of laboratory studies, ordering and review of radiographic studies, pulse oximetry and re-evaluation of patient's condition.     Junius Argyle, MD 10/02/13 1034

## 2013-10-02 NOTE — Evaluation (Signed)
Physical Therapy Evaluation Patient Details Name: [name] MRN: [id number] DOB: [birth year] Today's Date: 10/02/2013 Time: 1010-1045 PT Time Calculation (min): 35 min  PT Assessment / Plan / Recommendation History of Present Illness  Pt admit with sepsis.  Recent right CVA.  Clinical Impression  Pt admitted with above. Pt currently with functional limitations due to the deficits listed below (see PT Problem List). Pt limited today by low BP and lethargy.  CAnnot even sit EOB unsupported.  Will need total care on d/c and per chart family was taking care of pt PTA after CVA.  Will need to contact family to determine what equipment they obtained after recent admit as pt could not answer questions.  If family cannot provide 24 hour total assist, will need NHP.  Pt will benefit from skilled PT to increase their independence and safety with mobility to allow discharge to the venue listed below.     PT Assessment  Patient needs continued PT services    Follow Up Recommendations  SNF;Supervision/Assistance - 24 hour                Equipment Recommendations  Other (comment) (TBA)         Frequency Min 2X/week    Precautions / Restrictions Precautions Precautions: Fall Precaution Comments: L sided neglect Restrictions Weight Bearing Restrictions: No   Pertinent Vitals/Pain BP 85/54 initially.  Dropped to 79/40 with sitting EOB.  MD came in and asked PT to trendelenberg bed with BP 91/42 with bed in this position.  MD ordering meds and nursing came in with meds just as PT was leaving .  Positioned pt on side with pillows.  Other VSS, No specific pain but pt grimaces and moans with movement.       Mobility  Bed Mobility Overal bed mobility: Needs Assistance;+2 for physical assistance Bed Mobility: Rolling;Supine to Sit;Sit to Supine Rolling: Max assist Supine to sit: +2 for physical assistance;Total assist Sit to supine: Total assist;+2 for physical assistance General bed  mobility comments:  Pt needs full assist, minimal initiation of movement on command, could not reach EOB secondary to patient unwillingness at this time         PT Diagnosis: Difficulty walking;Hemiplegia non-dominant side  PT Problem List: Decreased strength;Decreased activity tolerance;Decreased balance;Decreased mobility PT Treatment Interventions: DME instruction;Gait training;Functional mobility training;Therapeutic activities;Therapeutic exercise;Neuromuscular re-education;Balance training     PT Goals(Current goals can be found in the care plan section) Acute Rehab PT Goals Patient Stated Goal: did not state PT Goal Formulation: Patient unable to participate in goal setting Time For Goal Achievement: 10/09/13 Potential to Achieve Goals: Fair  Visit Information  Last PT Received On: 10/02/13 Assistance Needed: +2 History of Present Illness: Pt admit with sepsis.  Recent right CVA.       Prior Functioning  Home Living Family/patient expects to be discharged to:: Private residence Living Arrangements: Children;Other relatives Available Help at Discharge: Family;Available 24 hours/day Type of Home: House Additional Comments: Per Dr. Franky Macho who came in while PT working with pt, the granddaughter and family assisted pt after her CVA with 24 hour care.   Prior Function Level of Independence: Needs assistance Gait / Transfers Assistance Needed: Transfers with max assist ADL's / Homemaking Assistance Needed: total assist Comments: Used wheelchair after CVA Communication Communication: Expressive difficulties;Other (comment) (lethargic, minimal eye opening, spontaneous responses) Dominant Hand: Right    Cognition  Cognition Arousal/Alertness: Lethargic Behavior During Therapy: Flat affect Overall Cognitive Status: Impaired/Different from baseline Area of Impairment:  Problem solving;Attention Current Attention Level:  (aroused to focused) Problem Solving: Slow processing     Extremity/Trunk Assessment Upper Extremity Assessment Upper Extremity Assessment: Defer to OT evaluation Lower Extremity Assessment Lower Extremity Assessment: RLE deficits/detail;LLE deficits/detail RLE Deficits / Details: grossly 2+/5 LLE Deficits / Details: No active movement per command.  Neglect Cervical / Trunk Assessment Cervical / Trunk Assessment: Kyphotic   Balance Balance Overall balance assessment: Needs assistance;History of Falls Sitting-balance support: Single extremity supported;Feet supported Sitting balance-Leahy Scale: Zero Sitting balance - Comments: pt with L laterl bias, unable to indentify midline or achieve midline.  Had to lie pt down as she had a drop in BP.   Postural control: Left lateral lean (anterior lean )  End of Session PT - End of Session Equipment Utilized During Treatment: Gait belt Activity Tolerance: Patient limited by lethargy Patient left: in bed;with call bell/phone within reach;with bed alarm set;with family/visitor present Nurse Communication: Mobility status;Precautions;Need for lift equipment       INGOLD,Kamdin Follett 10/02/2013, 2:19 PM [facility] Acute Rehabilitation [phone] (pager)

## 2013-10-03 DIAGNOSIS — R05 Cough: Secondary | ICD-10-CM

## 2013-10-03 DIAGNOSIS — A498 Other bacterial infections of unspecified site: Secondary | ICD-10-CM

## 2013-10-03 DIAGNOSIS — R059 Cough, unspecified: Secondary | ICD-10-CM

## 2013-10-03 DIAGNOSIS — N390 Urinary tract infection, site not specified: Secondary | ICD-10-CM

## 2013-10-03 DIAGNOSIS — E870 Hyperosmolality and hypernatremia: Secondary | ICD-10-CM | POA: Diagnosis present

## 2013-10-03 LAB — BASIC METABOLIC PANEL
BUN: 33 mg/dL — ABNORMAL HIGH (ref 6–23)
BUN: 28 mg/dL — AB (ref 6–23)
BUN: 25 mg/dL — ABNORMAL HIGH (ref 6–23)
BUN: 23 mg/dL (ref 6–23)
CALCIUM: 8.9 mg/dL (ref 8.4–10.5)
CALCIUM: 9.1 mg/dL (ref 8.4–10.5)
CALCIUM: 8.9 mg/dL (ref 8.4–10.5)
CO2: 21 mEq/L (ref 19–32)
CO2: 22 mEq/L (ref 19–32)
CO2: 21 meq/L (ref 19–32)
CO2: 21 mEq/L (ref 19–32)
CREATININE: 0.78 mg/dL (ref 0.50–1.10)
CREATININE: 0.79 mg/dL (ref 0.50–1.10)
CREATININE: 0.79 mg/dL (ref 0.50–1.10)
Calcium: 8.7 mg/dL (ref 8.4–10.5)
Chloride: 117 mEq/L — ABNORMAL HIGH (ref 96–112)
Chloride: 118 mEq/L — ABNORMAL HIGH (ref 96–112)
Chloride: 117 mEq/L — ABNORMAL HIGH (ref 96–112)
Chloride: 113 mEq/L — ABNORMAL HIGH (ref 96–112)
Creatinine, Ser: 0.74 mg/dL (ref 0.50–1.10)
GFR calc Af Amer: 84 mL/min — ABNORMAL LOW (ref >90)
GFR calc non Af Amer: 71 mL/min — ABNORMAL LOW (ref >90)
GFR calc non Af Amer: 71 mL/min — ABNORMAL LOW (ref >90)
GFR, EST AFRICAN AMERICAN: 82 mL/min — AB (ref >90)
GFR, EST AFRICAN AMERICAN: 82 mL/min — AB (ref >90)
GFR, EST AFRICAN AMERICAN: 82 mL/min — AB (ref >90)
GFR, EST NON AFRICAN AMERICAN: 71 mL/min — AB (ref >90)
GFR, EST NON AFRICAN AMERICAN: 72 mL/min — AB (ref >90)
GLUCOSE: 372 mg/dL — AB (ref 70–99)
Glucose, Bld: 313 mg/dL — ABNORMAL HIGH (ref 70–99)
Glucose, Bld: 253 mg/dL — ABNORMAL HIGH (ref 70–99)
Glucose, Bld: 287 mg/dL — ABNORMAL HIGH (ref 70–99)
Potassium: 3.4 mEq/L — ABNORMAL LOW (ref 3.7–5.3)
Potassium: 3.2 mEq/L — ABNORMAL LOW (ref 3.7–5.3)
Potassium: 4.1 mEq/L (ref 3.7–5.3)
Potassium: 3.7 mEq/L (ref 3.7–5.3)
SODIUM: 149 meq/L — AB (ref 137–147)
Sodium: 154 mEq/L — ABNORMAL HIGH (ref 137–147)
Sodium: 155 mEq/L — ABNORMAL HIGH (ref 137–147)
Sodium: 152 mEq/L — ABNORMAL HIGH (ref 137–147)

## 2013-10-03 LAB — CBC WITH DIFFERENTIAL/PLATELET
BASOS ABS: 0 10*3/uL (ref 0.0–0.1)
BASOS PCT: 0 % (ref 0–1)
EOS PCT: 1 % (ref 0–5)
Eosinophils Absolute: 0.1 10*3/uL (ref 0.0–0.7)
HEMATOCRIT: 29.6 % — AB (ref 36.0–46.0)
Hemoglobin: 9.8 g/dL — ABNORMAL LOW (ref 12.0–15.0)
Lymphocytes Relative: 8 % — ABNORMAL LOW (ref 12–46)
Lymphs Abs: 1.1 10*3/uL (ref 0.7–4.0)
MCH: 31.3 pg (ref 26.0–34.0)
MCHC: 33.1 g/dL (ref 30.0–36.0)
MCV: 94.6 fL (ref 78.0–100.0)
MONO ABS: 0.6 10*3/uL (ref 0.1–1.0)
Monocytes Relative: 5 % (ref 3–12)
Neutro Abs: 11.6 10*3/uL — ABNORMAL HIGH (ref 1.7–7.7)
Neutrophils Relative %: 86 % — ABNORMAL HIGH (ref 43–77)
Platelets: 123 10*3/uL — ABNORMAL LOW (ref 150–400)
RBC: 3.13 MIL/uL — ABNORMAL LOW (ref 3.87–5.11)
RDW: 16.6 % — AB (ref 11.5–15.5)
WBC: 13.5 10*3/uL — ABNORMAL HIGH (ref 4.0–10.5)

## 2013-10-03 LAB — URINE CULTURE

## 2013-10-03 LAB — GLUCOSE, CAPILLARY
GLUCOSE-CAPILLARY: 254 mg/dL — AB (ref 70–99)
GLUCOSE-CAPILLARY: 274 mg/dL — AB (ref 70–99)
GLUCOSE-CAPILLARY: 375 mg/dL — AB (ref 70–99)
Glucose-Capillary: 276 mg/dL — ABNORMAL HIGH (ref 70–99)
Glucose-Capillary: 234 mg/dL — ABNORMAL HIGH (ref 70–99)
Glucose-Capillary: 217 mg/dL — ABNORMAL HIGH (ref 70–99)
Glucose-Capillary: 289 mg/dL — ABNORMAL HIGH (ref 70–99)

## 2013-10-03 LAB — MAGNESIUM: MAGNESIUM: 1.8 mg/dL (ref 1.5–2.5)

## 2013-10-03 MED ORDER — INSULIN ASPART 100 UNIT/ML ~~LOC~~ SOLN
0.0000 [IU] | SUBCUTANEOUS | Status: DC
  Administered 2013-10-03: 3 [IU] via SUBCUTANEOUS
  Administered 2013-10-03: 9 [IU] via SUBCUTANEOUS
  Administered 2013-10-03: 3 [IU] via SUBCUTANEOUS
  Administered 2013-10-03 (×3): 5 [IU] via SUBCUTANEOUS
  Administered 2013-10-04: 3 [IU] via SUBCUTANEOUS
  Administered 2013-10-04: 7 [IU] via SUBCUTANEOUS
  Administered 2013-10-04 (×2): 3 [IU] via SUBCUTANEOUS
  Administered 2013-10-04: 7 [IU] via SUBCUTANEOUS

## 2013-10-03 MED ORDER — VANCOMYCIN HCL IN DEXTROSE 750-5 MG/150ML-% IV SOLN
750.0000 mg | INTRAVENOUS | Status: DC
  Filled 2013-10-03: qty 150

## 2013-10-03 MED ORDER — METHOTREXATE 2.5 MG PO TABS
10.0000 mg | ORAL_TABLET | ORAL | Status: DC
  Filled 2013-10-03 (×2): qty 4

## 2013-10-03 MED ORDER — SODIUM CHLORIDE 0.9 % IV BOLUS (SEPSIS)
500.0000 mL | Freq: Once | INTRAVENOUS | Status: AC
  Administered 2013-10-03: 500 mL via INTRAVENOUS

## 2013-10-03 MED ORDER — POTASSIUM CHLORIDE 10 MEQ/100ML IV SOLN
10.0000 meq | INTRAVENOUS | Status: AC
  Administered 2013-10-03 (×2): 10 meq via INTRAVENOUS
  Filled 2013-10-03 (×2): qty 100

## 2013-10-03 MED ORDER — SODIUM CHLORIDE 0.9 % IV SOLN
INTRAVENOUS | Status: DC
Start: 2013-10-03 — End: 2013-10-03
  Administered 2013-10-03: 1000 mL via INTRAVENOUS

## 2013-10-03 NOTE — Progress Notes (Addendum)
Subjective:  [name] was seen and examined at bedside.  She was sleeping and difficult to arouse.  Afebrile after admission.  Soft BP'S overnight.    Objective: Vital signs in last 24 hours: Filed Vitals:   10/03/13 0403 10/03/13 0454 10/03/13 0500 10/03/13 0726  BP:  98/54 97/56 103/34  Pulse:  69 68 68  Temp:  97.4 F (36.3 C)  97.6 F (36.4 C)  TempSrc:  Other (Comment)  Other (Comment)  Resp:  24 21 17   Height:      Weight: 133 lb 13.1 oz (60.7 kg)     SpO2:  98% 100% 100%   Weight change: 8 lb 6 oz (3.8 kg)  Intake/Output Summary (Last 24 hours) at 10/03/13 0956 Last data filed at 10/02/13 2200  Gross per 24 hour  Intake    596 ml  Output    500 ml  Net     96 ml   Vitals reviewed. Constitutional: sleeping, difficult to arouse, Cachetic  HEENT: R eye cataract, L eye upper pupil gray haze, kept right eye closed mostly today. EOMI intact with frequent voice commands to follow.  Cardiac: irregularly irregular Pulm: clear to auscultation bilaterally, no wheezes but anterior exam only Abd: soft, nontender, nondistended, BS present Ext: warm and well perfused, b/l prevalon boots in place, moving R extremities Neuro: Somnolent but does follow commands, decreased strength due to poor effort, L hemiparesis Skin: ulcers on b/l heels ?deep tissue injury, sacral ulcer difficult to stage, darkened upraising of skin upper back  Lab Results: Basic Metabolic Panel:  Recent Labs Lab 10/02/13 1915 10/03/13 0400  NA 151* 154*  K 3.7 3.4*  CL 115* 117*  CO2 20 21  GLUCOSE 370* 313*  BUN 37* 33*  CREATININE 0.79 0.78  CALCIUM 8.6 8.9   Liver Function Tests:  Recent Labs Lab 10/01/13 1030 10/02/13 0025  AST 19 20  ALT 11 10  ALKPHOS 84 67  BILITOT 0.7 0.7  PROT 6.3 5.1*  ALBUMIN 2.4* 1.9*   CBC:  Recent Labs Lab 10/01/13 1030  10/02/13 0745 10/03/13 0400  WBC 16.0*  < > 15.6* 13.5*  NEUTROABS 13.6*  --   --  11.6*  HGB 12.4  < > 10.9* 9.8*  HCT 37.1   < > 33.3* 29.6*  MCV 96.9  < > 96.0 94.6  PLT 196  < > 149* 123*  < > = values in this interval not displayed. Cardiac Enzymes:  Recent Labs Lab 10/01/13 1810 10/02/13 0025 10/02/13 0434  TROPONINI 0.73* 0.53* 0.46*   CBG:  Recent Labs Lab 10/01/13 1100 10/02/13 0802 10/03/13 0024 10/03/13 0457 10/03/13 0809 10/03/13 0949  GLUCAP 222* 169* 375* 276* 254* 234*   Thyroid Function Tests:  Recent Labs Lab 10/01/13 1810  TSH 0.664   Coagulation:  Recent Labs Lab 10/02/13 0025  LABPROT 18.2*  INR 1.55*   Urine Drug Screen: Drugs of Abuse     Component Value Date/Time   LABOPIA NONE DETECTED 09/15/2013 1709   COCAINSCRNUR NONE DETECTED 09/15/2013 1709   LABBENZ NONE DETECTED 09/15/2013 1709   AMPHETMU NONE DETECTED 09/15/2013 1709   THCU NONE DETECTED 09/15/2013 1709   LABBARB NONE DETECTED 09/15/2013 1709    Urinalysis:  Recent Labs Lab 10/01/13 1127  COLORURINE ORANGE*  LABSPEC 1.024  PHURINE 5.0  GLUCOSEU NEGATIVE  HGBUR SMALL*  BILIRUBINUR MODERATE*  KETONESUR 15*  PROTEINUR 30*  UROBILINOGEN 1.0  NITRITE NEGATIVE  LEUKOCYTESUR MODERATE*   Micro Results: Recent  Results (from the past 240 hour(s))  CULTURE, BLOOD (ROUTINE X 2)     Status: None   Collection Time    10/01/13 10:30 AM      Result Value Range Status   Specimen Description BLOOD RIGHT FOREARM   Final   Special Requests BOTTLES DRAWN AEROBIC AND ANAEROBIC 5CCS   Final   Culture  Setup Time     Final   Value: 10/01/2013 18:19     Performed at [facility]   Culture     Final   Value:        BLOOD CULTURE RECEIVED NO GROWTH TO DATE CULTURE WILL BE HELD FOR 5 DAYS BEFORE ISSUING A FINAL NEGATIVE REPORT     Performed at [facility]   Report Status PENDING   Incomplete  CULTURE, BLOOD (ROUTINE X 2)     Status: None   Collection Time    10/01/13 11:10 AM      Result Value Range Status   Specimen Description BLOOD LEFT ANTECUBITAL   Final   Special Requests BOTTLES DRAWN  AEROBIC AND ANAEROBIC 5CC   Final   Culture  Setup Time     Final   Value: 10/01/2013 18:19     Performed at [facility]   Culture     Final   Value:        BLOOD CULTURE RECEIVED NO GROWTH TO DATE CULTURE WILL BE HELD FOR 5 DAYS BEFORE ISSUING A FINAL NEGATIVE REPORT     Performed at [facility]   Report Status PENDING   Incomplete  URINE CULTURE     Status: None   Collection Time    10/01/13 11:27 AM      Result Value Range Status   Specimen Description URINE, CATHETERIZED   Final   Special Requests NONE   Final   Culture  Setup Time     Final   Value: 10/01/2013 18:21     Performed at [facility]   Colony Count     Final   Value: >=100,000 COLONIES/ML     Performed at [facility]   Culture     Final   Value: ESCHERICHIA COLI     Performed at [facility]   Report Status 10/03/2013 FINAL   Final   Organism ID, Bacteria ESCHERICHIA COLI   Final   Studies/Results: Ct Head Wo Contrast  10/01/2013   CLINICAL DATA:  Increasing lethargy.  EXAM: CT HEAD WITHOUT CONTRAST  TECHNIQUE: Contiguous axial images were obtained from the base of the skull through the vertex without intravenous contrast.  COMPARISON:  09/19/2013.  FINDINGS: The right lenticular nucleus hemorrhage with extension into the right corona radiata is less dense than on the prior examination consistent with red cell lysis cysts with surrounding vasogenic edema. Although slow, this represent expected evolution of intracranial hemorrhage. Mass-effect upon the right ventricle has decreased in the interim.  MR detected tiny amount hemorrhage left parietal lobe not appreciated on present CT.  Remote right occipital lobe infarct.  Small vessel disease type changes without CT evidence of large acute thrombotic infarct.  No intracranial mass lesion separate from above described findings.  Atrophy. Ventricular prominence probably related to atrophy rather than hydrocephalus  Tiny amount of gas  in the left cavernous sinus region may be related to IV.  IMPRESSION: The right lenticular nucleus hemorrhage with extension into the right corona radiata is less dense than on the prior examination consistent  with red cell lysis cysts with surrounding vasogenic edema. Although slow, this represent expected evolution of intracranial hemorrhage. Mass-effect upon the right ventricle has decreased in the interim.   Electronically Signed   By: Bridgett Larsson M.D.   On: 10/01/2013 12:19   Dg Chest Port 1 View  10/01/2013   CLINICAL DATA:  Fever and cerebral infarction.  EXAM: PORTABLE CHEST - 1 VIEW  COMPARISON:  09/16/2013  FINDINGS: There is stable cardiomegaly. No overt edema, infiltrate or pleural fluid is identified. The visualized bony thorax is unremarkable.  IMPRESSION: No acute infiltrate identified.  Stable cardiomegaly.   Electronically Signed   By: Irish Lack M.D.   On: 10/01/2013 11:46   Medications: I have reviewed the patient's current medications. Scheduled Meds: . allopurinol  100 mg Oral Daily  . brimonidine  1 drop Both Eyes Q12H   And  . timolol  1 drop Both Eyes BID  . brinzolamide  1 drop Both Eyes TID  . collagenase   Topical Daily  . feeding supplement (ENSURE)  1 Container Oral TID BM  . insulin aspart  0-9 Units Subcutaneous Q4H  . latanoprost  1 drop Both Eyes QHS  . levothyroxine  125 mcg Oral QAC breakfast  . piperacillin-tazobactam (ZOSYN)  IV  3.375 g Intravenous Q8H  . prednisoLONE acetate  1 drop Right Eye BID  . simvastatin  10 mg Oral q1800  . sodium chloride  500 mL Intravenous Once  . sodium chloride  3 mL Intravenous Q12H  . vancomycin  500 mg Intravenous Q24H  . vitamin B-12  1,000 mcg Oral Daily   Continuous Infusions: . dextrose 5 % 1,000 mL with potassium chloride 20 mEq infusion 75 mL/hr at 10/03/13 0512   PRN Meds:.acetaminophen, acetaminophen, docusate sodium, nitroGLYCERIN, ondansetron (ZOFRAN) IV, ondansetron, RESOURCE THICKENUP  CLEAR Assessment/Plan:  [name] is a 80 year old female with PMH of HTN, hyperlipidemia, DM2, systolic CHF (30-35% on 09/26/13), CAD s/p MI and stent, atrial fibrillation, CVA Jan 215, Hypothyroidism on replacement, rheumatoid arthritis, and gout admitted for severe sepsis.   Sepsis 2/2 Ecoli UTI vs. ?pressure ulcers (large on on sacrum)--remains hypotensive improving with fluid bolus', afebrile since admission (Tmax 100.30F this admission) and leukocytosis trending down. On IV antibiotics.  Lactic acid trending down.  -BP 80-90/40's, will bolus 500cc NS x1 again and continue to monitor BP closely -Blood cx 2--NGTD -Evaluated by wound care yesterday--unstageable pressure ulcer scarum, SDTI b/l heels, and also has raised darkened area midspine; recommended prevalon boots, air mattress for pressure redistribution, and enzymatic debridement ointment for sacrum.  Signed off but may need to resconsult.  -continue IV Vancomycin and Zosyn--day 3 today -need to have goals of care with family, god-daughter is HCPOA  Hypertonic Hypernatremia - Serum osmolarity of 336, urine osmolality of 689 with sodium on admission of 155. Most likely due to hypovolemia in setting of hypotension.  Na did improve to 151 last night from 158, now 154 this morning.  -giving D5W with KCL @75 -100cc/hr, but intermittent NS bolus for BP control in the meantime -BMETs Q6H  Elevated Troponins--on admission 0.73 and trended down to 0.46.  Likely secondary to demand ischemia in setting of severe sepsis.    -Pt is not candidate for anticoagulation at this time given intracranial hemorrhage that is not yet isodense  Hypokalemia--K down to 3.4 day of admission, supplementing and also add K to fluids.  -Obtain Mg level -Replete with KCl as needed -Continue to monitor  Anion Gap - improving.  Pt with AG of 20 on admission most likely due to lactic acidosis due to sepsis, now 16 -Continue to monitor  -lactic acid trended  down  Hypoalbuminemia and severe malnutrition  - Pt with severe malnutrition in the context of chronic illness and albumin of 1.9.  -Appreciate nutrition following -Ensure pudding PO TID, dys 1 diet for now  Atrial Fibrillation--remains in rate controlled Afib.  On carvedilol 6.25 mg BID at home and no AC therapy (contra-indicated given intracranial hemorrhage s/p TPA for infarct 2 weeks prior)  -Holding carvedilol 6.25 mg BID in setting  -Place SCDs  -discussed with neurology on admission with Dr. Roque Lias, hemorrhage on recent CT not isodense, so will not start AP or AC therapy. Will need to continue to touch base.   Systolic Congestive Failure--2D-echo on 09/16/13 with reduced EF of 30-35% and Akinesis of the entireanterior myocardium. LV diastolic could not be assessed. -Obtain daily weights (125 on admission, up to 133lb today) and I &O's (500 mL urine output yesterday ?accurace) -Caution with IVF's but need to give bolus and fluids since likely dry and hypotensive and hypernatremic  Ischemic Stroke with hemorrhagic conversion--hemorrhagic conversion of a right lentiform nucleus infarct s/p IV tPA, infarct embolic secondary to atrial fibrillation during last hospitalization 1/6-1/12.  -CT Head w/o contrast ---> expected evolution of intracranial hemorrhage  -Discussed with neurology on admission, per Dr. Roseanne Reno not isodense, so will not start AP or AC therapy at this time, but may need to discuss with them further. Will definitely need outpatient follow up.  -Hold AC or AP therapy for now  -INR 1.55 -PT/OT--SNF  Coronary Artery Disease - Pt with acute anterolateral myocardial infarction that occurred on 02/03/2012 with successful PTCA and stenting of the mid LAD with a non-drug-eluting 3.5 x 24 mm Veriflex stent. CE trended down -Continue home pravastatin 20 mg daily  -Nitroglycerin as needed for CP  -tele-monitoring, pvc's and pause noted on tele with HR <50 as well.   DM2--CBG's  elevated in setting of D5W.  HbA1c on 6.5 on 09/16/13. Pt on metformin 500 mg BID at home -Hold home metformin 500 mg BID  -Monitor glucose frequently  -added SSI  Hypothyroidism -Continue levothyroxine 125 mcg daily  -TSH 0.664  Rheumatoid Arthritis--on methotrexate at home 10mg  q weekly -Will resume methotrexate (q saturdays)  Gout - currently stable -Continue allopurinol 100 mg daily   Chronic Thrombocytopenia--Platelet count on admission of 196K with baseline 100-110K. Down to 123 today.  -Continue to monitor -Holding AP and AC therapy   Acute Kidney Injury secondary to pre-renal etiology in setting of decrease po intake and likely dehydration and sepsis. Resolved since admission.  On Ramipril at home 1.25mg  qd in setting of hypotension and renal function.  -Continue to hold home ramipril  -daily weights: weight 125-->133lb today.  Low output, foley in place but seems to be improving. ?accurace, have asked closer monitoring and recording.  -Maintain foley catheter   Diet: Dysphagia 1  DVT PPx: SCDs  Code: Full  Dispo: Disposition is deferred at this time, awaiting improvement of current medical problems.  Nee to have goals of care discussion with family. Anticipated discharge is unknown.   The patient does have a current PCP Milana Obey, MD) and does need an [facility] hospital follow-up appointment after discharge.  The patient does not have transportation limitations that hinder transportation to clinic appointments.  Services Needed at time of discharge: Y = Yes, Blank = No PT:   OT:  RN:   Equipment:   Other:     LOS: 2 days   Darden Palmer, MD 10/03/2013, 9:56 AM

## 2013-10-03 NOTE — Progress Notes (Signed)
Dr Virgina Organ in to see and evaluate pt. Made aware that she is lethargic but arousable. Following simple commands.EKG results provided. Monitor shows controlled Afib rates 88-98. Made aware that we are unable to obtain O2 sats. Have tried several fingers on both hands, ear, forehead,toe and the Oximax without success. Respiratory therapist paged for other options. Instructed to hold Methotrexate until am.

## 2013-10-03 NOTE — Progress Notes (Signed)
Internal Medicine Attending  Date: 10/03/2013  Patient name: [name] Medical record number: [id number] Date of birth: September 07, 1921 Age: 80 y.o. Gender: female  I saw and evaluated the patient on A.M rounds , and discussed her care with housestaff.  I reviewed the resident's note by Dr. Virgina Organ and I agree with the resident's findings and plans as documented in her note.

## 2013-10-03 NOTE — Progress Notes (Signed)
ANTIBIOTIC CONSULT NOTE - INITIAL  Pharmacy Consult for Vancomycin, Zosyn Indication: Sepsis   No Known Allergies  Patient Measurements: Height: 5' 4.17" (163 cm) Weight: 133 lb 13.1 oz (60.7 kg) IBW/kg (Calculated) : 55.1  Vital Signs: Temp: 97.4 F (36.3 C) (01/24 1319) Temp src: Axillary (01/24 1319) BP: 101/31 mmHg (01/24 1319) Pulse Rate: 77 (01/24 1319) Intake/Output from previous day: 01/23 0701 - 01/24 0700 In: 853.5 [P.O.:40; I.V.:793.5] Out: 500 [Urine:500] Intake/Output from this shift: Total I/O In: 50 [P.O.:50] Out: 200 [Urine:200]  Labs:  Recent Labs  10/01/13 1030 10/01/13 1819  10/02/13 0025 10/02/13 0745 10/02/13 1915 10/03/13 0400 10/03/13 1026  WBC 16.0*  --   --  13.7* 15.6*  --  13.5*  --   HGB 12.4  --   --  10.2* 10.9*  --  9.8*  --   PLT 196  --   --  137* 149*  --  123*  --   LABCREA  --  148.55  --   --   --   --   --   --   CREATININE 1.17*  --   < > 1.02 0.97 0.79 0.78 0.79  < > = values in this interval not displayed. Estimated Creatinine Clearance: 39.8 ml/min (by C-G formula based on Cr of 0.79). No results found for this basename: VANCOTROUGH, Leodis Binet, VANCORANDOM, GENTTROUGH, GENTPEAK, GENTRANDOM, TOBRATROUGH, TOBRAPEAK, TOBRARND, AMIKACINPEAK, AMIKACINTROU, AMIKACIN,  in the last 72 hours   Microbiology: Recent Results (from the past 720 hour(s))  MRSA PCR SCREENING     Status: None   Collection Time    09/15/13  6:42 PM      Result Value Range Status   MRSA by PCR NEGATIVE  NEGATIVE Final   Comment:            The GeneXpert MRSA Assay (FDA     approved for NASAL specimens     only), is one component of a     comprehensive MRSA colonization     surveillance program. It is not     intended to diagnose MRSA     infection nor to guide or     monitor treatment for     MRSA infections.  CULTURE, BLOOD (ROUTINE X 2)     Status: None   Collection Time    10/01/13 10:30 AM      Result Value Range Status   Specimen  Description BLOOD RIGHT FOREARM   Final   Special Requests BOTTLES DRAWN AEROBIC AND ANAEROBIC 5CCS   Final   Culture  Setup Time     Final   Value: 10/01/2013 18:19     Performed at [facility]   Culture     Final   Value:        BLOOD CULTURE RECEIVED NO GROWTH TO DATE CULTURE WILL BE HELD FOR 5 DAYS BEFORE ISSUING A FINAL NEGATIVE REPORT     Performed at [facility]   Report Status PENDING   Incomplete  CULTURE, BLOOD (ROUTINE X 2)     Status: None   Collection Time    10/01/13 11:10 AM      Result Value Range Status   Specimen Description BLOOD LEFT ANTECUBITAL   Final   Special Requests BOTTLES DRAWN AEROBIC AND ANAEROBIC 5CC   Final   Culture  Setup Time     Final   Value: 10/01/2013 18:19     Performed at [facility]  Final   Value:        BLOOD CULTURE RECEIVED NO GROWTH TO DATE CULTURE WILL BE HELD FOR 5 DAYS BEFORE ISSUING A FINAL NEGATIVE REPORT     Performed at [facility]   Report Status PENDING   Incomplete  URINE CULTURE     Status: None   Collection Time    10/01/13 11:27 AM      Result Value Range Status   Specimen Description URINE, CATHETERIZED   Final   Special Requests NONE   Final   Culture  Setup Time     Final   Value: 10/01/2013 18:21     Performed at [facility] Count     Final   Value: >=100,000 COLONIES/ML     Performed at [facility]   Culture     Final   Value: ESCHERICHIA COLI     Performed at [facility]   Report Status 10/03/2013 FINAL   Final   Organism ID, Bacteria ESCHERICHIA COLI   Final    Medical History: Past Medical History  Diagnosis Date  . Diabetes mellitus   . Glaucoma   . A-fib   . Myocardial infarction     3.5x53mm Veriflex stent 02/03/2012  . Coronary artery disease   . Hypothyroidism   . CHF (congestive heart failure)   . Arthritis     RA  . Stroke 09/2013    Medications:  Prescriptions prior to admission  Medication Sig  Dispense Refill  . allopurinol (ZYLOPRIM) 100 MG tablet Take 100 mg by mouth daily.      . bimatoprost (LUMIGAN) 0.03 % ophthalmic solution Place 1 drop into both eyes at bedtime.      . brimonidine-timolol (COMBIGAN) 0.2-0.5 % ophthalmic solution Place 1 drop into both eyes every 12 (twelve) hours.      . brinzolamide (AZOPT) 1 % ophthalmic suspension Place 1 drop into both eyes 3 (three) times daily.      . carvedilol (COREG) 6.25 MG tablet Take 6.25 mg by mouth 2 (two) times daily with a meal.      . docusate sodium (COLACE) 100 MG capsule Take 100 mg by mouth daily as needed for mild constipation.      Marland Kitchen levothyroxine (SYNTHROID, LEVOTHROID) 125 MCG tablet Take 125 mcg by mouth daily.      . methotrexate (RHEUMATREX) 2.5 MG tablet Take 10 mg by mouth once a week. Caution:Chemotherapy. Protect from light. On Saturdays      . nitroGLYCERIN (NITROSTAT) 0.4 MG SL tablet Place 0.4 mg under the tongue every 5 (five) minutes as needed for chest pain.      Marland Kitchen Psyllium (METAMUCIL PO) Take 1 packet by mouth daily as needed (constipation).      . Maltodextrin-Xanthan Gum (RESOURCE THICKENUP CLEAR) POWD Take 120 g by mouth as needed (thicken liquids to nectar thick consistency).  1 Can  12  . metFORMIN (GLUCOPHAGE) 500 MG tablet Take 500 mg by mouth 2 (two) times daily with a meal.       . pravastatin (PRAVACHOL) 20 MG tablet Take 20 mg by mouth every evening.      . prednisoLONE acetate (PRED FORTE) 1 % ophthalmic suspension Place 1 drop into the right eye 2 (two) times daily.      . ramipril (ALTACE) 1.25 MG capsule Take 1.25 mg by mouth daily.      . vitamin B-12 (CYANOCOBALAMIN) 1000 MCG tablet Take 1,000 mcg by mouth  daily.       Assessment: 80 yo F admitted with non-productive cough, lethargy and AMS. On arrival to the ED, she was febrile with a temp of 100.9 F. Pharmacy was consulted and started vancomycin and Zosyn for treatment of sepsis.  She is currently afebrile with a WBC trending down to  13.5.  Urine cultures have revealed pan-sensitive E.coli, but there is concerns for infectious etiology from pressure sores.  SCr has remained stable with CrCl~40.    Cultures: 1/22 Blood Cx x2>> NGTD 1/22 Urine Cx x2 >> pan-sensitive e.coli  Goal of Therapy:  Vancomycin trough level 15-20 mcg/ml  Plan:  - change Vancomycin IV to 750mg  q24h - collect vancomycin trough as necessary  - continue Zosyn 3.375 gm IV Q 8 hours (4-hour extended infusion) - monitor CBC, renal fx, blood cultures, and patient clinical status - f/u narrowing of antibiotic and suspicion for pressure sore etiology of sepsis  . Shelba Flake, PharmD Clinical Pharmacist - Resident Pager: [phone] Pharmacy: [phone] 10/03/2013 3:21 PM

## 2013-10-03 NOTE — Progress Notes (Signed)
Pt has sustained tachycardic heartrate in 130-140s; MD notified; she will come to evaluate patient; pt responds to questions; will continue to monitor

## 2013-10-04 LAB — GLUCOSE, CAPILLARY
GLUCOSE-CAPILLARY: 241 mg/dL — AB (ref 70–99)
GLUCOSE-CAPILLARY: 117 mg/dL — AB (ref 70–99)
GLUCOSE-CAPILLARY: 195 mg/dL — AB (ref 70–99)
GLUCOSE-CAPILLARY: 300 mg/dL — AB (ref 70–99)
GLUCOSE-CAPILLARY: 250 mg/dL — AB (ref 70–99)
Glucose-Capillary: 328 mg/dL — ABNORMAL HIGH (ref 70–99)

## 2013-10-04 LAB — BASIC METABOLIC PANEL
BUN: 21 mg/dL (ref 6–23)
BUN: 18 mg/dL (ref 6–23)
BUN: 19 mg/dL (ref 6–23)
BUN: 18 mg/dL (ref 6–23)
CALCIUM: 8.6 mg/dL (ref 8.4–10.5)
CHLORIDE: 117 meq/L — AB (ref 96–112)
CO2: 23 mEq/L (ref 19–32)
CO2: 21 mEq/L (ref 19–32)
CO2: 19 meq/L (ref 19–32)
CO2: 21 mEq/L (ref 19–32)
CREATININE: 0.71 mg/dL (ref 0.50–1.10)
Calcium: 8.6 mg/dL (ref 8.4–10.5)
Calcium: 8.5 mg/dL (ref 8.4–10.5)
Calcium: 8.7 mg/dL (ref 8.4–10.5)
Chloride: 116 mEq/L — ABNORMAL HIGH (ref 96–112)
Chloride: 116 mEq/L — ABNORMAL HIGH (ref 96–112)
Chloride: 116 mEq/L — ABNORMAL HIGH (ref 96–112)
Creatinine, Ser: 0.77 mg/dL (ref 0.50–1.10)
Creatinine, Ser: 0.71 mg/dL (ref 0.50–1.10)
Creatinine, Ser: 0.74 mg/dL (ref 0.50–1.10)
GFR calc Af Amer: 83 mL/min — ABNORMAL LOW (ref >90)
GFR calc Af Amer: 85 mL/min — ABNORMAL LOW (ref >90)
GFR calc Af Amer: 85 mL/min — ABNORMAL LOW (ref >90)
GFR calc Af Amer: 84 mL/min — ABNORMAL LOW (ref >90)
GFR calc non Af Amer: 71 mL/min — ABNORMAL LOW (ref >90)
GFR calc non Af Amer: 73 mL/min — ABNORMAL LOW (ref >90)
GFR calc non Af Amer: 73 mL/min — ABNORMAL LOW (ref >90)
GFR calc non Af Amer: 72 mL/min — ABNORMAL LOW (ref >90)
GLUCOSE: 169 mg/dL — AB (ref 70–99)
GLUCOSE: 221 mg/dL — AB (ref 70–99)
Glucose, Bld: 230 mg/dL — ABNORMAL HIGH (ref 70–99)
Glucose, Bld: 298 mg/dL — ABNORMAL HIGH (ref 70–99)
POTASSIUM: 3.4 meq/L — AB (ref 3.7–5.3)
Potassium: 3.3 mEq/L — ABNORMAL LOW (ref 3.7–5.3)
Potassium: 3.8 mEq/L (ref 3.7–5.3)
Potassium: 4.5 mEq/L (ref 3.7–5.3)
SODIUM: 151 meq/L — AB (ref 137–147)
Sodium: 150 mEq/L — ABNORMAL HIGH (ref 137–147)
Sodium: 148 mEq/L — ABNORMAL HIGH (ref 137–147)
Sodium: 150 mEq/L — ABNORMAL HIGH (ref 137–147)

## 2013-10-04 MED ORDER — DEXTROSE 5 % IV SOLN
1.0000 g | INTRAVENOUS | Status: DC
  Administered 2013-10-04 – 2013-10-05 (×2): 1 g via INTRAVENOUS
  Filled 2013-10-04 (×3): qty 10

## 2013-10-04 MED ORDER — INSULIN ASPART 100 UNIT/ML ~~LOC~~ SOLN
0.0000 [IU] | Freq: Three times a day (TID) | SUBCUTANEOUS | Status: DC
  Administered 2013-10-05: 5 [IU] via SUBCUTANEOUS
  Administered 2013-10-05 – 2013-10-06 (×4): 3 [IU] via SUBCUTANEOUS
  Administered 2013-10-06: 8 [IU] via SUBCUTANEOUS
  Administered 2013-10-07: 2 [IU] via SUBCUTANEOUS
  Administered 2013-10-07: 3 [IU] via SUBCUTANEOUS

## 2013-10-04 MED ORDER — SODIUM CHLORIDE 0.9 % IV BOLUS (SEPSIS)
500.0000 mL | Freq: Once | INTRAVENOUS | Status: AC
  Administered 2013-10-04: 500 mL via INTRAVENOUS

## 2013-10-04 MED ORDER — SODIUM CHLORIDE 0.9 % IV SOLN
INTRAVENOUS | Status: DC
  Administered 2013-10-04: 75 mL/h via INTRAVENOUS

## 2013-10-04 MED ORDER — DEXTROSE 5 % IV SOLN: INTRAVENOUS | Status: DC

## 2013-10-04 NOTE — Progress Notes (Signed)
Dr.Sadek made aware of [id number]/[id number]. New orders pending. Pt easily arousable and follows simple commands

## 2013-10-04 NOTE — Progress Notes (Signed)
Fluid bolus infusing as ordered. B/P 100/37

## 2013-10-04 NOTE — Progress Notes (Signed)
Subjective:  [name] was seen and examined at bedside today.  She was more alert today and talkative but sleeping. Her BP remains soft but slightly improved.   Objective: Vital signs in last 24 hours: Filed Vitals:   10/04/13 0800 10/04/13 0900 10/04/13 1100 10/04/13 1133  BP:  98/44 104/85 112/46  Pulse: 70   78  Temp: 99.1 F (37.3 C)   98.4 F (36.9 C)  TempSrc: Core (Comment)   Core (Comment)  Resp:  22 17 20   Height:      Weight:      SpO2: 100%   100%   Weight change:   Intake/Output Summary (Last 24 hours) at 10/04/13 1353 Last data filed at 10/04/13 1131  Gross per 24 hour  Intake 431.75 ml  Output   1075 ml  Net -643.25 ml   Vitals reviewed. Constitutional: sleeping but easy to arouse, cachetic  HEENT: R eye cataract, L eye upper pupil gray haze, opens both eyes Cardiac: irregularly irregular Pulm: clear to auscultation bilaterally, no wheezes but limited posterior exam as patient is difficult to move even with help Abd: soft, nontender, nondistended, BS present Ext: warm and well perfused, b/l prevalon boots in place, moving R extremities Neuro: Somnolent but does follow commands, decreased strength due to poor effort, L hemiparesis Skin: ulcers on b/l heels, sacral ulcer difficult to stage with dressing, darkened upraising of skin upper back with clean dressing in place  Lab Results: Basic Metabolic Panel:  Recent Labs Lab 10/03/13 0400  10/04/13 0356 10/04/13 1105  NA 154*  < > 151* 150*  K 3.4*  < > 3.3* 3.8  CL 117*  < > 116* 116*  CO2 21  < > 23 21  GLUCOSE 313*  < > 230* 169*  BUN 33*  < > 21 18  CREATININE 0.78  < > 0.77 0.71  CALCIUM 8.9  < > 8.6 8.6  MG 1.8  --   --   --   < > = values in this interval not displayed. Liver Function Tests:  Recent Labs Lab 10/01/13 1030 10/02/13 0025  AST 19 20  ALT 11 10  ALKPHOS 84 67  BILITOT 0.7 0.7  PROT 6.3 5.1*  ALBUMIN 2.4* 1.9*   CBC:  Recent Labs Lab 10/01/13 1030   10/02/13 0745 10/03/13 0400  WBC 16.0*  < > 15.6* 13.5*  NEUTROABS 13.6*  --   --  11.6*  HGB 12.4  < > 10.9* 9.8*  HCT 37.1  < > 33.3* 29.6*  MCV 96.9  < > 96.0 94.6  PLT 196  < > 149* 123*  < > = values in this interval not displayed. Cardiac Enzymes:  Recent Labs Lab 10/01/13 1810 10/02/13 0025 10/02/13 0434  TROPONINI 0.73* 0.53* 0.46*   CBG:  Recent Labs Lab 10/03/13 1612 10/03/13 1934 10/04/13 0026 10/04/13 0340 10/04/13 0826 10/04/13 1214  GLUCAP 289* 274* 328* 241* 117* 195*   Thyroid Function Tests:  Recent Labs Lab 10/01/13 1810  TSH 0.664   Coagulation:  Recent Labs Lab 10/02/13 0025  LABPROT 18.2*  INR 1.55*   Urine Drug Screen: Drugs of Abuse     Component Value Date/Time   LABOPIA NONE DETECTED 09/15/2013 1709   COCAINSCRNUR NONE DETECTED 09/15/2013 1709   LABBENZ NONE DETECTED 09/15/2013 1709   AMPHETMU NONE DETECTED 09/15/2013 1709   THCU NONE DETECTED 09/15/2013 1709   LABBARB NONE DETECTED 09/15/2013 1709    Urinalysis:  Recent Labs Lab  10/01/13 1127  COLORURINE ORANGE*  LABSPEC 1.024  PHURINE 5.0  GLUCOSEU NEGATIVE  HGBUR SMALL*  BILIRUBINUR MODERATE*  KETONESUR 15*  PROTEINUR 30*  UROBILINOGEN 1.0  NITRITE NEGATIVE  LEUKOCYTESUR MODERATE*   Micro Results: Recent Results (from the past 240 hour(s))  CULTURE, BLOOD (ROUTINE X 2)     Status: None   Collection Time    10/01/13 10:30 AM      Result Value Range Status   Specimen Description BLOOD RIGHT FOREARM   Final   Special Requests BOTTLES DRAWN AEROBIC AND ANAEROBIC 5CCS   Final   Culture  Setup Time     Final   Value: 10/01/2013 18:19     Performed at [facility]   Culture     Final   Value:        BLOOD CULTURE RECEIVED NO GROWTH TO DATE CULTURE WILL BE HELD FOR 5 DAYS BEFORE ISSUING A FINAL NEGATIVE REPORT     Performed at [facility]   Report Status PENDING   Incomplete  CULTURE, BLOOD (ROUTINE X 2)     Status: None   Collection Time    10/01/13  11:10 AM      Result Value Range Status   Specimen Description BLOOD LEFT ANTECUBITAL   Final   Special Requests BOTTLES DRAWN AEROBIC AND ANAEROBIC 5CC   Final   Culture  Setup Time     Final   Value: 10/01/2013 18:19     Performed at [facility]   Culture     Final   Value:        BLOOD CULTURE RECEIVED NO GROWTH TO DATE CULTURE WILL BE HELD FOR 5 DAYS BEFORE ISSUING A FINAL NEGATIVE REPORT     Performed at [facility]   Report Status PENDING   Incomplete  URINE CULTURE     Status: None   Collection Time    10/01/13 11:27 AM      Result Value Range Status   Specimen Description URINE, CATHETERIZED   Final   Special Requests NONE   Final   Culture  Setup Time     Final   Value: 10/01/2013 18:21     Performed at [facility] Count     Final   Value: >=100,000 COLONIES/ML     Performed at [facility]   Culture     Final   Value: ESCHERICHIA COLI     Performed at [facility]   Report Status 10/03/2013 FINAL   Final   Organism ID, Bacteria ESCHERICHIA COLI   Final   Medications: I have reviewed the patient's current medications. Scheduled Meds: . allopurinol  100 mg Oral Daily  . brimonidine  1 drop Both Eyes Q12H   And  . timolol  1 drop Both Eyes BID  . brinzolamide  1 drop Both Eyes TID  . collagenase   Topical Daily  . feeding supplement (ENSURE)  1 Container Oral TID BM  . insulin aspart  0-9 Units Subcutaneous Q4H  . latanoprost  1 drop Both Eyes QHS  . levothyroxine  125 mcg Oral QAC breakfast  . methotrexate  10 mg Oral Weekly  . piperacillin-tazobactam (ZOSYN)  IV  3.375 g Intravenous Q8H  . prednisoLONE acetate  1 drop Right Eye BID  . simvastatin  10 mg Oral q1800  . sodium chloride  3 mL Intravenous Q12H  . vancomycin  750 mg Intravenous Q24H  . vitamin  B-12  1,000 mcg Oral Daily   Continuous Infusions: . dextrose     PRN Meds:.acetaminophen, acetaminophen, docusate sodium, nitroGLYCERIN, ondansetron  (ZOFRAN) IV, ondansetron, RESOURCE THICKENUP CLEAR Assessment/Plan:  [name] is a 80 year old female with PMH of HTN, hyperlipidemia, DM2, systolic CHF (30-35% on 09/26/13), CAD s/p MI and stent, atrial fibrillation, CVA Jan 215, Hypothyroidism on replacement, rheumatoid arthritis, and gout admitted for severe sepsis.   Sepsis 2/2 Ecoli UTI vs. ?pressure ulcers (large on on sacrum)--remains hypotensive improving with fluid bolus', afebrile since admission (Tmax 100.20F this admission) and leukocytosis trending down. On IV antibiotics.  Lactic acid down to 1.8 -BP 90-100/40-50's, requires close monitoring and NS limited bolus' -Blood cx 2--NGTD -BID dressing changes per wound care instructions by RN   -D/C IV Vancomycin and Zosyn, transitioned to IV rocephin today, total day 4 of abx -need to have goals of care with family, god-daughter is HCPOA  Hypertonic Hypernatremia--improving.  Na down to 150 this afternoon. Will continue D5W.   Recent Labs Lab 10/03/13 1026 10/03/13 1630 10/03/13 2258 10/04/13 0356 10/04/13 1105  NA 155* 152* 149* 151* 150*    -BMETs Q6H  Anion Gap - improving. Pt with AG of 20 on admission most likely due to lactic acidosis due to sepsis, now 13 -Continue to monitor   Hypoalbuminemia and severe malnutrition  - Pt with severe malnutrition in the context of chronic illness and albumin of 1.9.  -Appreciate nutrition following -Ensure pudding PO TID, dys 1 diet for now  Atrial Fibrillation--remains in rate controlled Afib.  On carvedilol 6.25 mg BID at home and no AC therapy (contra-indicated given intracranial hemorrhage s/p TPA for infarct 2 weeks prior).  Was briefly in HR 130-140's yesterday evening but spontaneously resolved. HR 70's today.  -Holding carvedilol 6.25 mg BID in setting hypotension   Systolic Congestive Failure--2D-echo on 09/16/13 with reduced EF of 30-35% and Akinesis of the entireanterior myocardium. LV diastolic could not be  assessed. -Obtain daily weights and I&O's.  - yesterday -Caution with IVF's but need to give bolus and fluids since likely dry and hypotensive and hypernatremic  Ischemic Stroke with hemorrhagic conversion--hemorrhagic conversion of a right lentiform nucleus infarct s/p IV tPA, infarct embolic secondary to atrial fibrillation during last hospitalization 1/6-1/12.  -CT Head w/o contrast ---> expected evolution of intracranial hemorrhage  -Discussed with neurology on admission, per Dr. Roseanne Reno not isodense, so will not start AP or AC therapy at this time, but may need to discuss with them further. Will definitely need outpatient follow up.  -Hold AC or AP therapy for now  -PT/OT--SNF  Coronary Artery Disease - Pt with acute anterolateral myocardial infarction that occurred on 02/03/2012 with successful PTCA and stenting of the mid LAD with a non-drug-eluting 3.5 x 24 mm Veriflex stent. CE trended down -Continue home pravastatin 20 mg daily  -Nitroglycerin as needed for CP  -tele-monitoring  DM2--CBG's elevated in setting of D5W.  HbA1c on 6.5 on 09/16/13. Pt on metformin 500 mg BID at home -Hold home metformin 500 mg BID  -Monitor glucose frequently  -SSI   Hypothyroidism -Continue levothyroxine 125 mcg daily  -TSH 0.664  Rheumatoid Arthritis--on methotrexate at home 10mg  q weekly -Will resume methotrexate (q saturdays)  Gout - currently stable -Continue allopurinol 100 mg daily   Chronic Thrombocytopenia--Platelet count on admission of 196K with baseline 100-110K. Down to 123 today.  -Continue to monitor -Holding AP and AC therapy   Acute Kidney Injury--resolved. Cr 1.17 on admission and down to 0.71  today.  Diet: Dysphagia 1  DVT PPx: SCDs  Code: Full  Dispo: Disposition is deferred at this time, awaiting improvement of current medical problems.  Nee to have goals of care discussion with family. Anticipated discharge is unknown.   The patient does have a current PCP  Milana Obey, MD) and does need an [facility] hospital follow-up appointment after discharge.  The patient does not have transportation limitations that hinder transportation to clinic appointments.  Services Needed at time of discharge: Y = Yes, Blank = No PT: SNF  OT:   RN:   Equipment:   Other:     LOS: 3 days   Darden Palmer, MD 10/04/2013, 1:53 PM

## 2013-10-04 NOTE — Progress Notes (Signed)
Internal Medicine Attending  Date: 10/04/2013  Patient name: [name] Medical record number: [id number] Date of birth: 1921-01-28 Age: 80 y.o. Gender: female  I saw and evaluated the patient on A.M rounds, and discussed her care with housestaff.  I reviewed the resident's note by Dr. Virgina Organ and I agree with the resident's findings and plans as documented in her note.

## 2013-10-05 ENCOUNTER — Inpatient Hospital Stay (HOSPITAL_COMMUNITY): Payer: Medicare Other

## 2013-10-05 LAB — CBC
HCT: 28.5 % — ABNORMAL LOW (ref 36.0–46.0)
HEMOGLOBIN: 9.2 g/dL — AB (ref 12.0–15.0)
MCH: 30.8 pg (ref 26.0–34.0)
MCHC: 32.3 g/dL (ref 30.0–36.0)
MCV: 95.3 fL (ref 78.0–100.0)
PLATELETS: 106 10*3/uL — AB (ref 150–400)
RBC: 2.99 MIL/uL — AB (ref 3.87–5.11)
RDW: 16.4 % — ABNORMAL HIGH (ref 11.5–15.5)
WBC: 11.7 10*3/uL — ABNORMAL HIGH (ref 4.0–10.5)

## 2013-10-05 LAB — GLUCOSE, CAPILLARY
GLUCOSE-CAPILLARY: 145 mg/dL — AB (ref 70–99)
GLUCOSE-CAPILLARY: 167 mg/dL — AB (ref 70–99)
GLUCOSE-CAPILLARY: 216 mg/dL — AB (ref 70–99)
Glucose-Capillary: 153 mg/dL — ABNORMAL HIGH (ref 70–99)
Glucose-Capillary: 169 mg/dL — ABNORMAL HIGH (ref 70–99)

## 2013-10-05 LAB — BASIC METABOLIC PANEL
BUN: 17 mg/dL (ref 6–23)
BUN: 16 mg/dL (ref 6–23)
BUN: 16 mg/dL (ref 6–23)
BUN: 15 mg/dL (ref 6–23)
CALCIUM: 8.5 mg/dL (ref 8.4–10.5)
CHLORIDE: 114 meq/L — AB (ref 96–112)
CHLORIDE: 113 meq/L — AB (ref 96–112)
CO2: 23 mEq/L (ref 19–32)
CO2: 20 mEq/L (ref 19–32)
CO2: 21 mEq/L (ref 19–32)
CO2: 23 mEq/L (ref 19–32)
Calcium: 9 mg/dL (ref 8.4–10.5)
Calcium: 8.9 mg/dL (ref 8.4–10.5)
Calcium: 8.7 mg/dL (ref 8.4–10.5)
Chloride: 118 mEq/L — ABNORMAL HIGH (ref 96–112)
Chloride: 113 mEq/L — ABNORMAL HIGH (ref 96–112)
Creatinine, Ser: 0.79 mg/dL (ref 0.50–1.10)
Creatinine, Ser: 0.66 mg/dL (ref 0.50–1.10)
Creatinine, Ser: 0.66 mg/dL (ref 0.50–1.10)
Creatinine, Ser: 0.7 mg/dL (ref 0.50–1.10)
GFR calc Af Amer: 85 mL/min — ABNORMAL LOW (ref >90)
GFR calc non Af Amer: 75 mL/min — ABNORMAL LOW (ref >90)
GFR calc non Af Amer: 73 mL/min — ABNORMAL LOW (ref >90)
GFR, EST AFRICAN AMERICAN: 82 mL/min — AB (ref >90)
GFR, EST AFRICAN AMERICAN: 87 mL/min — AB (ref >90)
GFR, EST AFRICAN AMERICAN: 87 mL/min — AB (ref >90)
GFR, EST NON AFRICAN AMERICAN: 71 mL/min — AB (ref >90)
GFR, EST NON AFRICAN AMERICAN: 75 mL/min — AB (ref >90)
Glucose, Bld: 141 mg/dL — ABNORMAL HIGH (ref 70–99)
Glucose, Bld: 196 mg/dL — ABNORMAL HIGH (ref 70–99)
Glucose, Bld: 223 mg/dL — ABNORMAL HIGH (ref 70–99)
Glucose, Bld: 244 mg/dL — ABNORMAL HIGH (ref 70–99)
POTASSIUM: 3.8 meq/L (ref 3.7–5.3)
POTASSIUM: 4 meq/L (ref 3.7–5.3)
Potassium: 4.2 mEq/L (ref 3.7–5.3)
Potassium: 3.4 mEq/L — ABNORMAL LOW (ref 3.7–5.3)
SODIUM: 151 meq/L — AB (ref 137–147)
SODIUM: 146 meq/L (ref 137–147)
SODIUM: 147 meq/L (ref 137–147)
Sodium: 147 mEq/L (ref 137–147)

## 2013-10-05 MED ORDER — INSULIN GLARGINE 100 UNIT/ML ~~LOC~~ SOLN
5.0000 [IU] | Freq: Every day | SUBCUTANEOUS | Status: DC
  Filled 2013-10-05: qty 0.05

## 2013-10-05 NOTE — Evaluation (Signed)
Occupational Therapy Evaluation Patient Details Name: [name] MRN: [id number] DOB: Apr 04, 1921 Today's Date: 10/05/2013 Time: 7253-6644 OT Time Calculation (min): 32 min  OT Assessment / Plan / Recommendation History of present illness Pt admit with sepsis.  Recent right CVA with left hemiplegia   Clinical Impression   Pt presents with lethargy, dense L hemiplegia with neglect. She requires +2 assist for mobility and total assist for all ADL.  Per chart, pt was dependent prior to this admission as well, living with her granddaughter.  No acute OT needs identified.  Recommending SNF level of care upon discharge.    OT Assessment  Patient does not need any further OT services, defer to discretion of SNF     Follow Up Recommendations  SNF;Supervision/Assistance - 24 hour (SNF if family unable to provide total care)    Barriers to Discharge      Equipment Recommendations       Recommendations for Other Services    Frequency       Precautions / Restrictions Precautions Precautions: Fall Precaution Comments: L sided neglect and hemiplegia Restrictions Weight Bearing Restrictions: No   Pertinent Vitals/Pain Grimaced with PROM R UE, repositioned, VSS    ADL  Eating/Feeding: +1 Total assistance Where Assessed - Eating/Feeding: Bed level Grooming: Wash/dry hands;Wash/dry face;+1 Total assistance Where Assessed - Grooming: Supine, head of bed up Upper Body Bathing: +2 Total assistance;Maximal assistance Where Assessed - Upper Body Bathing: Supine, head of bed up;Rolling right and/or left Lower Body Bathing: +2 Total assistance;Maximal assistance Where Assessed - Lower Body Bathing: Supine, head of bed up;Rolling right and/or left Upper Body Dressing: +1 Total assistance;Maximal assistance Where Assessed - Upper Body Dressing: Supine, head of bed up Lower Body Dressing: +2 Total assistance;Maximal assistance Where Assessed - Lower Body Dressing: Supine, head of bed  up;Rolling right and/or left Toileting - Clothing Manipulation and Hygiene: +2 Total assistance;Maximal assistance Where Assessed - Toileting Clothing Manipulation and Hygiene: Rolling right and/or left ADL Comments: Pt limited by lethargy, inability to participate.   OT Diagnosis:    OT Problem List:   OT Treatment Interventions:     OT Goals(Current goals can be found in the care plan section) Acute Rehab OT Goals Patient Stated Goal: did not state  Visit Information  Last OT Received On: 10/05/13 Assistance Needed: +2 History of Present Illness: Pt admit with sepsis.  Recent right CVA with left hemiplegia       Prior Functioning     Home Living Family/patient expects to be discharged to:: Private residence Living Arrangements: Children;Other relatives Available Help at Discharge: Family;Available 24 hours/day Type of Home: House Additional Comments: per chart, pt was total care for ADL, assisted for transfers to w/c since her CVA Prior Function Level of Independence: Needs assistance Gait / Transfers Assistance Needed: Transfers with max assist ADL's / Homemaking Assistance Needed: total assist Comments: Used wheelchair after CVA Communication Communication: Expressive difficulties;Other (comment) Dominant Hand: Right         Vision/Perception Vision - History Baseline Vision: Wears glasses only for reading Visual History: Glaucoma;Cataracts Vision - Assessment Vision Assessment: Vision impaired, R gaze preference   Cognition  Cognition Arousal/Alertness: Lethargic Behavior During Therapy: Flat affect Overall Cognitive Status: No family/caregiver present to determine baseline cognitive functioning Area of Impairment: Problem solving;Attention Current Attention Level: Focused Problem Solving: Slow processing    Extremity/Trunk Assessment Upper Extremity Assessment Upper Extremity Assessment: RUE deficits/detail;LUE deficits/detail RUE Deficits / Details:  arthritic changes in fingers, shoulder PROM to 80  degrees FF and abduction before tightness and grimacing, pt has hx of gout LUE Deficits / Details: flaccid with mild edema in hand, no active movement or response to tactile input Lower Extremity Assessment Lower Extremity Assessment: Defer to PT evaluation     Mobility Bed Mobility Overal bed mobility: Needs Assistance;+2 for physical assistance Bed Mobility: Rolling;Sit to Supine;Sidelying to Sit Rolling: +2 for physical assistance;Max assist Sidelying to sit: Total assist;+2 for physical assistance Sit to supine: +2 for physical assistance;Total assist General bed mobility comments:  Pt needs full assist, minimal initiation of movement on command Transfers: requires lift    Exercise     Balance Balance Overall balance assessment: Needs assistance Sitting balance-Leahy Scale: Zero Sitting balance - Comments: pt with left anterior lean with cues and assist to achieve midline but pt unable to maintain and reverts back to extreme flexion of neck and trunk Postural control: Left lateral lean;Other (comment) (anterior lean)   End of Session OT - End of Session Activity Tolerance: Patient limited by lethargy Patient left: in bed;with call bell/phone within reach;with bed alarm set  GO     Evern Bio 10/05/2013, 1:40 PM [phone]

## 2013-10-05 NOTE — Progress Notes (Signed)
Internal Medicine Attending  Date: 10/05/2013  Patient name: [name] Medical record number: [id number] Date of birth: 1921/06/17 Age: 80 y.o. Gender: female  We met with Ms. Robyne Peers, patient's granddaughter and nearest living relative, today and discussed patient's medical problems and progress to date.  Ms. Andres Ege has been patient's caregiver since her stroke.  Although patient's condition has improved since admission, I don't think she has the capacity for informed decision making at this time.  Ms. Andres Ege is in agreement with skilled nursing facility placement for patient.  We discussed patient's wishes regarding code status, and Ms. Andres Ege requested that patient's code status be changed to DO NOT RESUSCITATE which she feels is in accord with patient's previously expressed wishes prior to her stroke.  She does want to continue active treatment for medical conditions as we are doing.  We discussed with her the option of talking with the palliative care team about long-term goals of care, and she said that she would like to do that.  Our team will notify the social worker that Ms. Andres Ege would like to pursue SNF placement, and we will also ask the palliative care consult to see patient and talk with her granddaughter about long term goals of care.

## 2013-10-05 NOTE — Progress Notes (Addendum)
Today, a family meeting took place with patient's granddaughter, Ms. Adrian Saran, and patient's daughter-in-law to discuss patient's code status in the setting of patient's acute condition and limited decision making capacity. It was agreed that her code status be changed to do not resuscitate or intubate as these were previously discussed and according to her granddaughter are the patient's present wishes. They both also expressed their willingness to explore skilled nursing facility placement after hospitalization and palliative care with intent to improve her functional status and continue current active treatment of her medical conditions.

## 2013-10-05 NOTE — Procedures (Addendum)
Objective Swallowing Evaluation: Modified Barium Swallow Patient Details  Name: [name] MRN: [id number] Date of Birth: 20-Jun-1921  Today's Date: 10/05/2013 Time: 1013-1036 SLP Time Calculation (min): 23 min  Past Medical History:  Past Medical History  Diagnosis Date  . Diabetes mellitus   . Glaucoma   . A-fib   . Myocardial infarction     3.5x80mm Veriflex stent 02/03/2012  . Coronary artery disease   . Hypothyroidism   . CHF (congestive heart failure)   . Arthritis     RA  . Stroke 09/2013   Past Surgical History:  Past Surgical History  Procedure Laterality Date  . Eye surgery    . Cardiac catheterization  02/03/12  . Tonsillectomy     HPI:  Pt is a 80 yo F with h/o recent hemorrhagic stroke (right lenticular nucleus hemorrhage with extension into the right corona radiata), CHF, T2DM who presents with increased lethargy. Pt found to have Sepsis possibly secondary to UTI. MD notes state Pt also has history of aspiration, but given clear CXR, aspiration pneumonia is unlikely source of infection. MBS on 1/8 recommended puree and pudding. Pt remained lethargic during admission and was d/c'd before demosntrating any functional gains. Grandaughter reports she was consuming pureed solids and honey thick liquids at home but speech therapist was trialing upgraded textures with success.      Assessment / Plan / Recommendation Clinical Impression  Dysphagia Diagnosis: Moderate oral phase dysphagia;Severe pharyngeal phase dysphagia Clinical impression: Pt demonstrates little functional change since last MBS recommending pudding thick liquids. Pt continues to have 5-10 second periods of tongue pumping with spillage of liquids to pyriforms and into airway prior to the swallow with silent aspiration before and after the swallow (of residuals),  Pt can expel penetrate with cues to clear throat, but is unable to complete a second swallow in a timely manner to transit residuals before  repenetration. Though there is still some trace penetration with honey teaspoon, pt has been tolerating this diet at home. Recommend she continue a dys 1 ( puree) texture with honey thick liquids via teaspoon. There is potential to upgrade to ground diet if it would improve QOL. SLP will follow for tolerance, trials and family ed.     Treatment Recommendation  Therapy as outlined in treatment plan below    Diet Recommendation Dysphagia 1 (Puree);Honey-thick liquid   Liquid Administration via: Spoon Medication Administration: Crushed with puree Supervision: Full supervision/cueing for compensatory strategies;Staff to assist with self feeding;Trained caregiver to feed patient Compensations: Slow rate;Small sips/bites;Clear throat intermittently;Multiple dry swallows after each bite/sip Postural Changes and/or Swallow Maneuvers: Seated upright 90 degrees;Upright 30-60 min after meal    Other  Recommendations Oral Care Recommendations: Oral care BID Other Recommendations: Order thickener from pharmacy   Follow Up Recommendations  Home health SLP    Frequency and Duration min 2x/week  2 weeks   Pertinent Vitals/Pain NA    SLP Swallow Goals     General HPI: Pt is a 80 yo F with h/o recent hemorrhagic stroke (right lenticular nucleus hemorrhage with extension into the right corona radiata), CHF, T2DM who presents with increased lethargy. Pt found to have Sepsis possibly secondary to UTI. MD notes state Pt also has history of aspiration, but given clear CXR, aspiration pneumonia is unlikely source of infection. MBS on 1/8 recommended puree and pudding. Pt remained lethargic during admission and was d/c'd before demosntrating any functional gains. Grandaughter reports she was consuming pureed solids and honey thick liquids at  home but speech therapist was trialing upgraded textures with success.  Type of Study: Bedside swallow evaluation Reason for Referral: Objectively evaluate swallowing  function Previous Swallow Assessment: MBS1/7 Diet Prior to this Study: Dysphagia 1 (puree);Honey-thick liquids Temperature Spikes Noted: No Respiratory Status: Nasal cannula History of Recent Intubation: No Behavior/Cognition: Alert;Cooperative;Requires cueing Oral Cavity - Dentition: Edentulous Oral Motor / Sensory Function: Impaired - see Bedside swallow eval Self-Feeding Abilities: Total assist Patient Positioning: Postural control interferes with function Baseline Vocal Quality: Clear Volitional Cough: Weak Volitional Swallow: Able to elicit Anatomy:  (Appearance of bony protrusions at C3/4 C4/5) Pharyngeal Secretions: Not observed secondary MBS    Reason for Referral Objectively evaluate swallowing function   Oral Phase Oral Preparation/Oral Phase Oral Phase: Impaired Oral - Honey Oral - Honey Teaspoon: Reduced posterior propulsion;Lingual pumping;Right pocketing in lateral sulci;Left pocketing in lateral sulci;Delayed oral transit Oral - Nectar Oral - Nectar Teaspoon: Reduced posterior propulsion;Lingual pumping;Right pocketing in lateral sulci;Left pocketing in lateral sulci;Delayed oral transit Oral - Nectar Cup: Reduced posterior propulsion;Lingual pumping;Right pocketing in lateral sulci;Left pocketing in lateral sulci;Delayed oral transit Oral - Nectar Straw: Reduced posterior propulsion;Lingual pumping;Right pocketing in lateral sulci;Left pocketing in lateral sulci;Delayed oral transit Oral - Solids Oral - Puree: Reduced posterior propulsion;Lingual pumping;Right pocketing in lateral sulci;Left pocketing in lateral sulci;Delayed oral transit Oral - Mechanical Soft: Reduced posterior propulsion;Lingual pumping;Right pocketing in lateral sulci;Left pocketing in lateral sulci;Delayed oral transit   Pharyngeal Phase Pharyngeal Phase Pharyngeal Phase: Impaired Pharyngeal - Honey Pharyngeal - Honey Teaspoon: Premature spillage to pyriform sinuses;Delayed swallow  initiation;Penetration/Aspiration after swallow;Trace aspiration;Pharyngeal residue - valleculae;Pharyngeal residue - pyriform sinuses Penetration/Aspiration details (honey teaspoon): Material enters airway, CONTACTS cords and not ejected out;Material enters airway, remains ABOVE vocal cords and not ejected out Pharyngeal - Nectar Pharyngeal - Nectar Teaspoon: Delayed swallow initiation;Premature spillage to pyriform sinuses;Penetration/Aspiration before swallow;Trace aspiration;Pharyngeal residue - valleculae;Pharyngeal residue - pyriform sinuses Penetration/Aspiration details (nectar teaspoon): Material enters airway, passes BELOW cords without attempt by patient to eject out (silent aspiration) Pharyngeal - Nectar Cup: Not tested (pt could not position head for cup sip) Pharyngeal - Nectar Straw: Delayed swallow initiation;Premature spillage to pyriform sinuses;Penetration/Aspiration before swallow;Significant aspiration (Amount);Pharyngeal residue - valleculae;Pharyngeal residue - pyriform sinuses Penetration/Aspiration details (nectar straw): Material enters airway, passes BELOW cords without attempt by patient to eject out (silent aspiration) Pharyngeal - Solids Pharyngeal - Puree: Delayed swallow initiation;Premature spillage to valleculae Pharyngeal - Mechanical Soft: Delayed swallow initiation;Premature spillage to valleculae  Cervical Esophageal Phase    GO             Harlon Ditty, MA CCC-SLP [phone]  [name] 10/05/2013, 10:45 AM

## 2013-10-05 NOTE — Progress Notes (Signed)
I have read and agree with MS3 Manasi Tannu's note. Please see my progress note as well.  

## 2013-10-05 NOTE — Progress Notes (Signed)
Pt arousable, confused speech with intermittent clear phrases. Pt fed by RN: 20% meal, low fluid intake. Attention given to 2 x Stage 2 ulcers with moist to dry dsg change. Support given to granddaughter. All questions answered. Pt now DNR.

## 2013-10-05 NOTE — Progress Notes (Signed)
Clinical Social Work Department BRIEF PSYCHOSOCIAL ASSESSMENT 10/05/2013  Patient:  [name], [name]     Account Number:  [id number]     Admit date:  10/01/2013  Clinical Social Worker:  Varney Biles  Date/Time:  10/05/2013 11:24 AM  Referred by:  Physician  Date Referred:  10/05/2013 Referred for  SNF Placement   Other Referral:   Interview type:  Family Other interview type:    PSYCHOSOCIAL DATA Living Status:  FAMILY Admitted from facility:   Level of care:   Primary support name:  Adrian Saran [phone]) Primary support relationship to patient:  FAMILY Degree of support available:   Good--pt was living with granddaughter before hospital admission, per granddaughter Onita.    CURRENT CONCERNS Current Concerns  Post-Acute Placement   Other Concerns:    SOCIAL WORK ASSESSMENT / PLAN CSW called pt's granddaughter Merrilyn Puma, the contact person listed on pt's chart, as she is disoriented x3 according to her chart. CSW explained medical team recommendation for short-term SNF before pt returns home to build up her strength, and Merrilyn Puma states she wants to talk with the MD first before making a decision concerning discharge. CSW explained Merrilyn Puma can call back on the phone number CSW called on, as this is my work cell. CSW will check back with Onita this afternoon if I do not hear from her before the end of the work day.   Assessment/plan status:  Psychosocial Support/Ongoing Assessment of Needs Other assessment/ plan:   Information/referral to community resources:   ?SNF; ?Home Health    PATIENT'S/FAMILY'S RESPONSE TO PLAN OF CARE: Good--pt's granddaughter expressed that she wants to talk with MD first before making a decision about discharge and supports pt/family will need. CSW waiting for return call.       Maryclare Labrador, MSW, [facility] Clinical Social Worker [phone]

## 2013-10-05 NOTE — Progress Notes (Addendum)
Subjective:  Pt seen and examined in AM. No acute events overnight with stable blood pressure not requiring fluid bolus.  She is more verbal today but still lethargic. Pt denies weakness, paresthesias, change in vision, headache, lightheadedness, fever, chills, dyspnea, cough, CP, nausea, vomiting, abdominal pain, or change in BM or urination. No family present.    Objective: Vital signs in last 24 hours: Filed Vitals:   10/05/13 0000 10/05/13 0200 10/05/13 0445 10/05/13 0600  BP: 109/35 107/53 108/57 103/49  Pulse:  73 76 79  Temp:   97.9 F (36.6 C)   TempSrc:   Oral   Resp: 19 21 23 24   Height:      Weight:      SpO2:  99% 100% 100%   Weight change:   Intake/Output Summary (Last 24 hours) at 10/05/13 0751 Last data filed at 10/05/13 0400  Gross per 24 hour  Intake    623 ml  Output    875 ml  Net   -252 ml    Constitutional: She is oriented to person, place, and time. No distress.  Cachetic appearing  Head: Normocephalic and atraumatic.  Nose: Nose normal.  Mouth/Throat: Oropharynx is clear and moist. No oropharyngeal exudate.  Eyes:  EOM are normal Neck: Normal range of motion. Neck supple.  Cardiovascular: Normal rate. Irregular rhythm  Pulmonary/Chest: Effort normal and breath sounds normal. Anterior breath fields clear  Abdominal: Soft. Bowel sounds are normal. She exhibits no distension. There is no tenderness. There is no rebound and no guarding.  Musculoskeletal: left MCP swelling with no overlying warmth, erythema, or tenderness to palpation; SCDs on bilateral legs and boots on feet Neurological: She is alert and oriented to person, place, and time. No cranial nerve deficit.  Slow to respond to questions, no slurred speech. Left UE and LE 3/5 weakness, otherwise 4/5 throughout; No sensation of left UE or LE.  Skin: Skin is warm and dry. She is not diaphoretic.  Sacral wound ulcer covered by dressing     Lab Results: Basic Metabolic Panel:  Recent  Labs Lab 10/03/13 0400  10/04/13 2230 10/05/13 0247  NA 154*  < > 150* 151*  K 3.4*  < > 3.4* 3.8  CL 117*  < > 117* 118*  CO2 21  < > 21 23  GLUCOSE 313*  < > 221* 141*  BUN 33*  < > 18 17  CREATININE 0.78  < > 0.74 0.79  CALCIUM 8.9  < > 8.7 8.5  MG 1.8  --   --   --   < > = values in this interval not displayed. Liver Function Tests:  Recent Labs Lab 10/01/13 1030 10/02/13 0025  AST 19 20  ALT 11 10  ALKPHOS 84 67  BILITOT 0.7 0.7  PROT 6.3 5.1*  ALBUMIN 2.4* 1.9*   CBC:  Recent Labs Lab 10/01/13 1030  10/03/13 0400 10/05/13 0247  WBC 16.0*  < > 13.5* 11.7*  NEUTROABS 13.6*  --  11.6*  --   HGB 12.4  < > 9.8* 9.2*  HCT 37.1  < > 29.6* 28.5*  MCV 96.9  < > 94.6 95.3  PLT 196  < > 123* 106*  < > = values in this interval not displayed. Cardiac Enzymes:  Recent Labs Lab 10/01/13 1810 10/02/13 0025 10/02/13 0434  TROPONINI 0.73* 0.53* 0.46*   CBG:  Recent Labs Lab 10/04/13 0340 10/04/13 0826 10/04/13 1214 10/04/13 1630 10/04/13 1951 10/05/13 0444  GLUCAP  241* 117* 195* 300* 250* 145*   Thyroid Function Tests:  Recent Labs Lab 10/01/13 1810  TSH 0.664   Coagulation:  Recent Labs Lab 10/02/13 0025  LABPROT 18.2*  INR 1.55*   Urine Drug Screen: Drugs of Abuse     Component Value Date/Time   LABOPIA NONE DETECTED 09/15/2013 1709   COCAINSCRNUR NONE DETECTED 09/15/2013 1709   LABBENZ NONE DETECTED 09/15/2013 1709   AMPHETMU NONE DETECTED 09/15/2013 1709   THCU NONE DETECTED 09/15/2013 1709   LABBARB NONE DETECTED 09/15/2013 1709    Urinalysis:  Recent Labs Lab 10/01/13 1127  COLORURINE ORANGE*  LABSPEC 1.024  PHURINE 5.0  GLUCOSEU NEGATIVE  HGBUR SMALL*  BILIRUBINUR MODERATE*  KETONESUR 15*  PROTEINUR 30*  UROBILINOGEN 1.0  NITRITE NEGATIVE  LEUKOCYTESUR MODERATE*   Micro Results: Recent Results (from the past 240 hour(s))  CULTURE, BLOOD (ROUTINE X 2)     Status: None   Collection Time    10/01/13 10:30 AM      Result Value  Range Status   Specimen Description BLOOD RIGHT FOREARM   Final   Special Requests BOTTLES DRAWN AEROBIC AND ANAEROBIC 5CCS   Final   Culture  Setup Time     Final   Value: 10/01/2013 18:19     Performed at [facility]   Culture     Final   Value:        BLOOD CULTURE RECEIVED NO GROWTH TO DATE CULTURE WILL BE HELD FOR 5 DAYS BEFORE ISSUING A FINAL NEGATIVE REPORT     Performed at [facility]   Report Status PENDING   Incomplete  CULTURE, BLOOD (ROUTINE X 2)     Status: None   Collection Time    10/01/13 11:10 AM      Result Value Range Status   Specimen Description BLOOD LEFT ANTECUBITAL   Final   Special Requests BOTTLES DRAWN AEROBIC AND ANAEROBIC 5CC   Final   Culture  Setup Time     Final   Value: 10/01/2013 18:19     Performed at [facility]   Culture     Final   Value:        BLOOD CULTURE RECEIVED NO GROWTH TO DATE CULTURE WILL BE HELD FOR 5 DAYS BEFORE ISSUING A FINAL NEGATIVE REPORT     Performed at [facility]   Report Status PENDING   Incomplete  URINE CULTURE     Status: None   Collection Time    10/01/13 11:27 AM      Result Value Range Status   Specimen Description URINE, CATHETERIZED   Final   Special Requests NONE   Final   Culture  Setup Time     Final   Value: 10/01/2013 18:21     Performed at [facility] Count     Final   Value: >=100,000 COLONIES/ML     Performed at [facility]   Culture     Final   Value: ESCHERICHIA COLI     Performed at [facility]   Report Status 10/03/2013 FINAL   Final   Organism ID, Bacteria ESCHERICHIA COLI   Final   Medications: I have reviewed the patient's current medications. Scheduled Meds: . allopurinol  100 mg Oral Daily  . brimonidine  1 drop Both Eyes Q12H   And  . timolol  1 drop Both Eyes BID  . brinzolamide  1 drop Both Eyes TID  .  cefTRIAXone (ROCEPHIN)  IV  1 g Intravenous Q24H  . collagenase   Topical Daily  . feeding supplement  (ENSURE)  1 Container Oral TID BM  . insulin aspart  0-15 Units Subcutaneous TID WC  . latanoprost  1 drop Both Eyes QHS  . levothyroxine  125 mcg Oral QAC breakfast  . methotrexate  10 mg Oral Weekly  . prednisoLONE acetate  1 drop Right Eye BID  . simvastatin  10 mg Oral q1800  . sodium chloride  3 mL Intravenous Q12H  . vitamin B-12  1,000 mcg Oral Daily   Continuous Infusions: . dextrose     PRN Meds:.acetaminophen, acetaminophen, docusate sodium, nitroGLYCERIN, ondansetron (ZOFRAN) IV, ondansetron, RESOURCE THICKENUP CLEAR  Assessment: 80 year old woman with past medical history of hypertension, hyperlipidemia, non-insulin Type 2 DM, systolic CHF (30-35% on 09/26/13), CAD s/p MI and stent, atrial fibrillation, CVA Jan 215, Hypothyroidism on replacement, rheumatoid arthritis, and gout who presented on  1/22 with altered mental status per family and found to be in sepsis.   Plan:   Sepsis most likely due to E. Coli UTI - improving.  Leukocytosis resolved, however still tachypneic and with low-normal blood pressure.  Pt presented with SIRS+4 (T 100.9, HR 122, RR 28, WBC 16K with neutrophilia) and found to be hypotensive with blood pressure of 80/33 requiring fluid resuscitation. Lactic acid was elevated at 2.9 on admission which normalized. Source of infection unclear but most likely due to E. Coli UTI vs unstagable sacral pressure ulcer. Pt was found to be influenza negative with no acute cardiopulmonary disease on chest xray.  -Administer fluid bolus (NS to 1/2 NS) as needed for hypotension, hold anti-hypertensives  -Obtain blood cultures x 2 --> NGTD  -Appreciate wound care consult  -Continue Day 2 of IV ceftriaxone (total 5/10 days of antibiotics) for pansensitive  E. Coli UTI   -Monitor leukocytosis --> resolved (6K to 11.7) -Obtain PT consult --> SNF -Discuss cares of goal with family  Hypovolemic Hypernatremia - Serum osmolarity of 336, urine osmolality of 689 with sodium on  admission of 155, now 151. Most likely due to hypovolemia in setting of hypotension.  -Administer NS or 0.5NS for volume repletion then D5% with 20 mEq KClwith correction of Na of 10-12 mEq in 24 hr period with goal <145 -Continue to monitor   Elevated Troponin - Pt with elevated troponin of 0.73 to 0.53 to 0.46 most likely due to demand ischemia in setting of sepsis. Pt also with history of MI s/p stent on 02/03/12.  -[facility] due to recent hemorrhagic stroke -Supportive care   Unstageable Sacral Pressure Ulcer - Pt with unstagable pressure wound ulcer without subcutaneous fat/ tendon/bone penetration of 1 week duration due to bed bound status after ischemic stroke prior this month.  -Appreciate wound care consult -Continue air mattress, prevalon boots, enzymatic debridement ointment and moist gauze   Hypoalbuminemia and severe malnutrition - Pt with severe malnutrition in the context of chronic illness and albumin of 1.9.  -Appreciate nutrition consult--> modified barium swallow today -Ensure pudding PO TID   Atrial Fibrillation - currently in atrial fibrillation with normal HR on carvedilol 6.25 mg BID for rate control and no AC therapy due to recent hemorrhagic stroke -Hold carvedilol 6.25 mg BID in setting of hypotension -Continue b/l SCD's -Apprecinsider neurology consult for recommendations for AP or AC therapy --> per Dr. Roseanne RenoStewart not isodense, so will not start AP or AC therapy   Systolic  Congestive Failure - Last 2D-echo on 09/16/13 with reduced EF of 30-35% and akinesis of the entire anterior myocardium. LV diastolic could not be assessed. -Obtain daily weight (125 to 133b on 1/24) and I &O's (1L urine output yesterday)  -Caution with IVF's   Ischemic Stroke with hemorrhagic conversion - Pt with hemorrhagic conversion of a right lentiform nucleus infarct s/p IV tPA, infarct embolic secondary to atrial fibrillation during last hospitalization 1/6-1/12.    -CT Head w/o contrast ---> expected evolution of intracranial hemorrhage  -Per neurology (Dr. Roseanne Reno) since hemorrhage not isodense, hold AP and AC therapy  -Speech Consult ---> to perform modified barium swallow today, recommend home health SLP  Coronary Artery Disease - Pt with acute anterolateral myocardial infarction that occurred on 02/03/2012 with successful PTCA and stenting of the mid LAD with a non-drug-eluting 3.5 x 24 mm Veriflex stent.  -Continue home pravastatin 20 mg daily  -Nitroglycerin as needed for CP   Non-insulin-Type II Diabetes Mellitus - CBG 145. HbA1c on 6.5 on 09/16/13. Pt on metformin 500 mg BID at home.  -Hold home metformin 500 mg BID during hospitalization  -Monitor glucose frequently  -Continue moderate insulin sliding scale -If uncontrolled consider Lantus    Hypertension - Currently normotensive  -Hold home rampiril 1.25 mg daily in setting of hypotension   Hypothyroidism - Last TSH 1.327 on 02/03/12.  -Continue levothyroxine 125 mcg daily  -Obtain TSH --> wnl   Rheumatoid Arthritis -currently with mild b/l MCP swelling without joint pain, warmth,  or erythema  -Hold home methotrexate 10 mg weekly in setting of infection   Gout - currently stable without flare  -Continue allopurinol 100 mg daily   Chronic Thrombocytopenia -  Pt with no active bleeding. Platelet count on admission of 196K with baseline 100-110K, today 106K -Continue to monitor  -Hold AP and AC therapy   Anion Gap - Resolved. Pt with AG of 20 on admission most likely due to lactic acidosis due to sepsis, now closed to 10 most likely due to elevated lactic acid due to sepsis.     -Continue to monitor  Hypokalemia -  Resolved. Pt with mild potassium of 3.4 most likely due to decreased PO intake. s hypomagnesemia  -Continue  D5% with 20 mEq KCl -Continue to monitor   Acute Kidney Injury - Resolved. Pt with last Cr was 0.78 on 1/10, on admission 1.17, now 0.79. Most likely due to  pre-renal azotemia (hypolemia) as resolved with administration of IVF's and FeNa (0.2%) <1%  -Hold home ramipril  -Obtain daily weight (125 to 133b on 1/24) and I &O's (1L urine output yesterday)  -Maintain foley catheter    Diet: Dysphagia 1  DVT PPx: SCDs  Code: DNR/DNI  ADDENDUM: Today, a family meeting took place with patient's granddaughter, Ms. Adrian Saran, and patient's daughter-in-law to discuss patient's code status in the setting of patient's acute condition and limited decision making capacity. It was agreed that her code status be changed to do not resuscitate or intubate as these were previously discussed and according to her granddaughter are the patient's present wishes. They both also expressed their willingness to explore skilled nursing facility placement after hospitalization and palliative care with intent to improve her functional status and continue current active treatment of her medical conditions.       Dispo: Disposition is deferred at this time, awaiting improvement of current medical problems.  Nee to have goals of care discussion with family. Anticipated discharge is unknown.   The  patient does have a current PCP Milana Obey, MD) and does need an [facility] hospital follow-up appointment after discharge.  The patient does not have transportation limitations that hinder transportation to clinic appointments.  Services Needed at time of discharge: Y = Yes, Blank = No PT: SNF  OT:   RN:   Equipment:   Other:     LOS: 4 days   Otis Brace, MD 10/05/2013, 7:51 AM

## 2013-10-05 NOTE — Progress Notes (Signed)
Physical Therapy Treatment Patient Details Name: LOSSIE KALP MRN: [id number] DOB: 1921-07-17 Today's Date: 10/05/2013 Time: 6578-4696 PT Time Calculation (min): 24 min  PT Assessment / Plan / Recommendation  History of Present Illness Pt admit with sepsis.  Recent right CVA with left hemiplegia   PT Comments   Pt progressing with activity today, able to be EOB and engaging in conversation appropriately. Pt with extreme weakness and limited mobility. Will benefit from being lifted to chair by nursing daily and continued therapy.    Follow Up Recommendations  SNF;Supervision/Assistance - 24 hour     Does the patient have the potential to tolerate intense rehabilitation     Barriers to Discharge        Equipment Recommendations       Recommendations for Other Services    Frequency     Progress towards PT Goals Progress towards PT goals: Goals downgraded-see care plan  Plan Current plan remains appropriate    Precautions / Restrictions Precautions Precautions: Fall Precaution Comments: L sided neglect and hemiplegia   Pertinent Vitals/Pain sats 100% on 2L HR 103 BP 114/54 Pt reported pain in joints with movement but not at rest   Mobility  Bed Mobility Overal bed mobility: Needs Assistance;+2 for physical assistance Bed Mobility: Rolling;Sit to Supine;Sidelying to Sit Rolling: Max assist Sidelying to sit: +2 for physical assistance;Total assist Sit to supine: +2 for physical assistance;Total assist General bed mobility comments:  Pt needs full assist, minimal initiation of movement on command, could reach with arm for rail and attempt to push with RUE. Otherwise unable to perform transfer.  Transfers Transfers: Sit to/from Stand Sit to Stand: +2 physical assistance;Total assist General transfer comment: pt required 2+ total (A) for sit to stand to extend at hips and continued to have fwd flexed posture during standing; required LEs to be blocked to prevent buckling.  Stood x 2 with use of pad and belt to scoot toward HOB. Did not transfer to chair due to modified barium pending. RN notified of need to use Maximove to transfer to chair    Exercises     PT Diagnosis:    PT Problem List:   PT Treatment Interventions:     PT Goals (current goals can now be found in the care plan section)    Visit Information  Last PT Received On: 10/05/13 Assistance Needed: +2 History of Present Illness: Pt admit with sepsis.  Recent right CVA with left hemiplegia    Subjective Data      Cognition  Cognition Arousal/Alertness: Awake/alert Behavior During Therapy: Flat affect Overall Cognitive Status: No family/caregiver present to determine baseline cognitive functioning Problem Solving: Slow processing    Balance  Balance Overall balance assessment: Needs assistance Sitting balance-Leahy Scale: Zero Sitting balance - Comments: pt with left anterior lean with cues and assist to achieve midline but pt unable to maintain and reverts back to extreme flexion of neck and trunk Postural control: Left lateral lean;Other (comment) (anterior lean)  End of Session PT - End of Session Equipment Utilized During Treatment: Gait belt Activity Tolerance: Patient tolerated treatment well Patient left: in bed;with call bell/phone within reach Nurse Communication: Mobility status;Need for lift equipment   GP     Delorse Lek 10/05/2013, 10:30 AM Delaney Meigs, PT [phone]

## 2013-10-05 NOTE — Progress Notes (Signed)
Internal Medicine Attending  Date: 10/05/2013  Patient name: [name] Medical record number: [id number] Date of birth: 07-20-1921 Age: 80 y.o. Gender: female  I saw and evaluated the patient, and discussed her care on A.M rounds with housestaff.  I reviewed the resident's note by Dr. Johna Roles and I agree with the resident's findings and plans as documented in her note.

## 2013-10-05 NOTE — Progress Notes (Signed)
Medical Student Daily Progress Note  Subjective: Patient is more talkative on exam today but still slow to respond to questions. No acute events overnight. Her D5 was canceled last night due to hyperglycemia.   Objective: Vital signs in last 24 hours: Filed Vitals:   10/05/13 0200 10/05/13 0445 10/05/13 0600 10/05/13 0800  BP: 107/53 108/57 103/49   Pulse: 73 76 79   Temp:  97.9 F (36.6 C)  98.1 F (36.7 C)  TempSrc:  Oral  Oral  Resp: 21 23 24    Height:      Weight:      SpO2: 99% 100% 100%    Weight change:  Filed Weights   10/01/13 1700 10/02/13 0425 10/03/13 0403  Weight: 56.9 kg (125 lb 7.1 oz) 57.4 kg (126 lb 8.7 oz) 60.7 kg (133 lb 13.1 oz)     Intake/Output Summary (Last 24 hours) at 10/05/13 10/07/13 Last data filed at 10/05/13 0600  Gross per 24 hour  Intake    528 ml  Output    875 ml  Net   -347 ml   Physical Exam: Gen: arousable, more talkative than previous exam, cachectic HEENT: R eye cataract, L eye has gray haze on top of pupil, can open both eyes. Decreased EOM Cardiac: irregularly irregular Pulm: posterior exam revealed no crackles, no wheezes, anterior sounds clear Abd: soft, nontender, BS present Ext: bilateral prevalon boots, moves R toes Neuro: Somnolent but can respond to questions, decreased strength, L hemiparesis Skin: sacral ulcer but did not remove dressing, dressing on upper back   Lab Results:  Recent Labs Lab 10/01/13 1030 10/02/13 0025  AST 19 20  ALT 11 10  ALKPHOS 84 67  BILITOT 0.7 0.7  PROT 6.3 5.1*  ALBUMIN 2.4* 1.9*   CBC:  Recent Labs Lab 10/01/13 1030 10/02/13 0025 10/02/13 0745 10/03/13 0400 10/05/13 0247  WBC 16.0* 13.7* 15.6* 13.5* 11.7*  NEUTROABS 13.6*  --   --  11.6*  --   HGB 12.4 10.2* 10.9* 9.8* 9.2*  HCT 37.1 30.9* 33.3* 29.6* 28.5*  MCV 96.9 96.3 96.0 94.6 95.3  PLT 196 137* 149* 123* 106*    Cardiac Enzymes:  Recent Labs Lab 10/01/13 1810 10/02/13 0025 10/02/13 0434  TROPONINI 0.73*  0.53* 0.46*    CBG:  Recent Labs Lab 10/04/13 1630 10/04/13 1951 10/05/13 0028 10/05/13 0444 10/05/13 0755  GLUCAP 300* 250* 153* 145* 169*    Micro Results: Recent Results (from the past 240 hour(s))  CULTURE, BLOOD (ROUTINE X 2)     Status: None   Collection Time    10/01/13 10:30 AM      Result Value Range Status   Specimen Description BLOOD RIGHT FOREARM   Final   Special Requests BOTTLES DRAWN AEROBIC AND ANAEROBIC 5CCS   Final   Culture  Setup Time     Final   Value: 10/01/2013 18:19     Performed at 10/03/2013   Culture     Final   Value:        BLOOD CULTURE RECEIVED NO GROWTH TO DATE CULTURE WILL BE HELD FOR 5 DAYS BEFORE ISSUING A FINAL NEGATIVE REPORT     Performed at [facility]   Report Status PENDING   Incomplete  CULTURE, BLOOD (ROUTINE X 2)     Status: None   Collection Time    10/01/13 11:10 AM      Result Value Range Status   Specimen Description BLOOD LEFT ANTECUBITAL  Final   Special Requests BOTTLES DRAWN AEROBIC AND ANAEROBIC 5CC   Final   Culture  Setup Time     Final   Value: 10/01/2013 18:19     Performed at [facility]   Culture     Final   Value:        BLOOD CULTURE RECEIVED NO GROWTH TO DATE CULTURE WILL BE HELD FOR 5 DAYS BEFORE ISSUING A FINAL NEGATIVE REPORT     Performed at [facility]   Report Status PENDING   Incomplete  URINE CULTURE     Status: None   Collection Time    10/01/13 11:27 AM      Result Value Range Status   Specimen Description URINE, CATHETERIZED   Final   Special Requests NONE   Final   Culture  Setup Time     Final   Value: 10/01/2013 18:21     Performed at [facility] Count     Final   Value: >=100,000 COLONIES/ML     Performed at [facility]   Culture     Final   Value: ESCHERICHIA COLI     Performed at [facility]   Report Status 10/03/2013 FINAL   Final   Organism ID, Bacteria ESCHERICHIA COLI   Final   Medications: Scheduled  Meds: . allopurinol  100 mg Oral Daily  . brimonidine  1 drop Both Eyes Q12H   And  . timolol  1 drop Both Eyes BID  . brinzolamide  1 drop Both Eyes TID  . cefTRIAXone (ROCEPHIN)  IV  1 g Intravenous Q24H  . collagenase   Topical Daily  . feeding supplement (ENSURE)  1 Container Oral TID BM  . insulin aspart  0-15 Units Subcutaneous TID WC  . latanoprost  1 drop Both Eyes QHS  . levothyroxine  125 mcg Oral QAC breakfast  . methotrexate  10 mg Oral Weekly  . prednisoLONE acetate  1 drop Right Eye BID  . simvastatin  10 mg Oral q1800  . sodium chloride  3 mL Intravenous Q12H  . vitamin B-12  1,000 mcg Oral Daily   Continuous Infusions: . dextrose 50 mL (10/05/13 0130)   PRN Meds:.acetaminophen, acetaminophen, docusate sodium, nitroGLYCERIN, ondansetron (ZOFRAN) IV, ondansetron, RESOURCE THICKENUP CLEAR Assessment/Plan:  Ms. Halley is a 80 year old female with PMH of HTN, hyperlipidemia, DM2, systolic CHF (30-35% on 09/26/13), CAD s/p MI and stent, atrial fibrillation, CVA Jan 215, Hypothyroidism on replacement, rheumatoid arthritis, and gout admitted for severe sepsis.   Sepsis 2/2 Ecoli UTI vs. ?pressure ulcers (large on on sacrum)-- Ucx showed pan-sensitive E.Coli. BP is improving with fluid boluses (BPs in 100s/40s-50s overnight), but still hypotensive. Afebrile since admission (Tmax:100.45F), leukocytosis trending down (WBC: 11K today). Blood cultures show NGTD making bacteremia from pressure ulcer less likely cause of infection. On IV Rocephin (day 5), has received 4 days of IV Zosyn+vancomycin. -Continue IV Rocephin and consider switching to oral meds when tolerated. Administer 10 days total of antibiotic therapy. -BP: 90s-100s/40-50s, closely monitor and add NS bolus when required -Blood culture xs: NGTD -Bilateral heel pressure ulcers: prevalon boots to offload heels -Sacral ulcer: enzymatic debridement ointment for sacrum, moist gauze. BID dressing changes. -Discussed goals of  care with family: Pollyann Savoy is power of attorney. Agreeable to send to a SNF. Informed palliative care to have a goals of care talk with family as well.  Hypovolemic Hypernatremia--improving. Na down to 147 this afternoon. Will continue D5W.  Will reassess BMET at 6pm, and continue d/c'ing D5W.  Recent Labs     10/04/13  1105  10/04/13  1638  10/04/13  2230  10/05/13  0247  10/05/13  1215  NA  150*  148*  150*  151*  147  -Continue D5W at 10cc/hr -BMETs Q6H   Anion Gap - resolved. Pt with AG of 20 on admission most likely due to lactic acidosis due to sepsis, now 13. Lactate was 2.9 on admission, now 1.8. -Continue to monitor   Hypoalbuminemia and severe malnutrition - Pt with severe malnutrition in the context of chronic illness and albumin of 1.9.  -SLP evaluation today recommends dysphagia 1 diet with honey thick liquids. There is potential to upgrade to ground diet if it would improve  QOL. They will continue to follow for tolerance trials -Ensure pudding PO TID, dys 1 diet with honey thick liquids for now  -Asked nutrition to add more thickened water to pt's diet to give pt more free water and decrease serum Na.  Atrial Fibrillation--remains in rate controlled Afib. On carvedilol 6.25 mg BID at home and no AC therapy (contra-indicated given intracranial hemorrhage s/p TPA for infarct 2 weeks prior). Was briefly in HR 130-140's two days ago but spontaneously resolved. HR 70's-80's today.  -Holding carvedilol 6.25 mg BID in setting hypotension   Systolic Congestive Failure--2D-echo on 09/16/13 with reduced EF of 30-35% and Akinesis of the entire anterior myocardium. LV diastolic could not be assessed. -Obtain daily weights and I&O's. -49mL yesterday  -IVFs need to be given because of hypotension, hypernatremia and dry status, but caution due to systolic heart failure  Ischemic Stroke with hemorrhagic conversion--hemorrhagic conversion of a right lentiform nucleus infarct s/p IV tPA,  infarct embolic secondary to atrial fibrillation during last hospitalization 1/6-1/12.  -CT Head w/o contrast ---> expected evolution of intracranial hemorrhage  -Discussed with neurology on admission, per Dr. Roseanne Reno not isodense, so will not start AP or AC therapy at this time, but may need to discuss with them further. Will definitely need outpatient follow up.  -Hold AC or AP therapy for now  -PT/OT: evaluation recommended follow up PT therapy at SNF. Family is agreeable   Coronary Artery Disease - Pt with acute anterolateral myocardial infarction that occurred on 02/03/2012 with successful PTCA and stenting of the mid LAD with a non-drug-eluting 3.5 x 24 mm Veriflex stent. Trops trended down x3. Stopped obtaining trops because we cannot anticoagulate her due to recent hemorrhagic stroke. -Continue home pravastatin 20 mg daily  -Nitroglycerin as needed for CP  -tele-monitoring   DM2-- Patient with elevated CBGs for the past two nights. Last night, D5 was not given due to hyperglycemia. HbA1c on 6.5 on 09/16/13. Pt on metformin 500 mg BID at home.  -Nightly Lantus 5U and SSI -Hold home metformin 500 mg BID  -Monitor glucose frequently   Hypothyroidism  -Continue levothyroxine 125 mcg daily  -TSH normal at 0.664   Rheumatoid Arthritis--on methotrexate at home 10mg  q weekly  -Currently holding methotrexate because patient has infection.  Gout - currently stable  -Continue allopurinol 100 mg daily   Chronic Thrombocytopenia--Platelet count on admission of 196K with baseline 100-110K. Down to 106K today.  -Continue to monitor  -Holding AP and AC therapy   Acute Kidney Injury--resolved. Cr 1.17 on admission and down to 0.66 today.  This is a Psychologist, occupational Note.  The care of the patient was discussed with Dr. Johna Roles and the assessment and plan formulated with their  assistance.  Please see their attached note for official documentation of the daily encounter.  Satine Hausner 10/05/2013,  8:33 AM

## 2013-10-06 LAB — BASIC METABOLIC PANEL
BUN: 15 mg/dL (ref 6–23)
BUN: 15 mg/dL (ref 6–23)
BUN: 14 mg/dL (ref 6–23)
BUN: 14 mg/dL (ref 6–23)
CALCIUM: 8.7 mg/dL (ref 8.4–10.5)
CALCIUM: 8.7 mg/dL (ref 8.4–10.5)
CHLORIDE: 115 meq/L — AB (ref 96–112)
CO2: 24 mEq/L (ref 19–32)
CO2: 25 mEq/L (ref 19–32)
CO2: 17 meq/L — AB (ref 19–32)
CO2: 24 mEq/L (ref 19–32)
CREATININE: 0.72 mg/dL (ref 0.50–1.10)
CREATININE: 0.72 mg/dL (ref 0.50–1.10)
CREATININE: 0.64 mg/dL (ref 0.50–1.10)
Calcium: 8.9 mg/dL (ref 8.4–10.5)
Calcium: 8.7 mg/dL (ref 8.4–10.5)
Chloride: 114 mEq/L — ABNORMAL HIGH (ref 96–112)
Chloride: 113 mEq/L — ABNORMAL HIGH (ref 96–112)
Chloride: 112 mEq/L (ref 96–112)
Creatinine, Ser: 0.67 mg/dL (ref 0.50–1.10)
GFR calc Af Amer: 84 mL/min — ABNORMAL LOW (ref >90)
GFR calc Af Amer: 88 mL/min — ABNORMAL LOW (ref >90)
GFR calc Af Amer: 86 mL/min — ABNORMAL LOW (ref >90)
GFR calc non Af Amer: 73 mL/min — ABNORMAL LOW (ref >90)
GFR calc non Af Amer: 76 mL/min — ABNORMAL LOW (ref >90)
GFR, EST AFRICAN AMERICAN: 84 mL/min — AB (ref >90)
GFR, EST NON AFRICAN AMERICAN: 73 mL/min — AB (ref >90)
GFR, EST NON AFRICAN AMERICAN: 75 mL/min — AB (ref >90)
GLUCOSE: 136 mg/dL — AB (ref 70–99)
Glucose, Bld: 224 mg/dL — ABNORMAL HIGH (ref 70–99)
Glucose, Bld: 271 mg/dL — ABNORMAL HIGH (ref 70–99)
Glucose, Bld: 202 mg/dL — ABNORMAL HIGH (ref 70–99)
Potassium: 3.5 mEq/L — ABNORMAL LOW (ref 3.7–5.3)
Potassium: 4 mEq/L (ref 3.7–5.3)
Potassium: 5.7 mEq/L — ABNORMAL HIGH (ref 3.7–5.3)
Potassium: 3.4 mEq/L — ABNORMAL LOW (ref 3.7–5.3)
SODIUM: 148 meq/L — AB (ref 137–147)
Sodium: 148 mEq/L — ABNORMAL HIGH (ref 137–147)
Sodium: 150 mEq/L — ABNORMAL HIGH (ref 137–147)
Sodium: 145 mEq/L (ref 137–147)

## 2013-10-06 LAB — GLUCOSE, CAPILLARY
GLUCOSE-CAPILLARY: 195 mg/dL — AB (ref 70–99)
Glucose-Capillary: 201 mg/dL — ABNORMAL HIGH (ref 70–99)
Glucose-Capillary: 272 mg/dL — ABNORMAL HIGH (ref 70–99)
Glucose-Capillary: 121 mg/dL — ABNORMAL HIGH (ref 70–99)

## 2013-10-06 MED ORDER — LEVOFLOXACIN 500 MG PO TABS
500.0000 mg | ORAL_TABLET | Freq: Every day | ORAL | Status: AC
  Administered 2013-10-06: 500 mg via ORAL
  Filled 2013-10-06: qty 1

## 2013-10-06 MED ORDER — LEVOFLOXACIN 250 MG PO TABS
250.0000 mg | ORAL_TABLET | ORAL | Status: DC
  Administered 2013-10-07 – 2013-10-08 (×2): 250 mg via ORAL
  Filled 2013-10-06 (×2): qty 1

## 2013-10-06 MED ORDER — DEXTROSE 5 % IV SOLN
INTRAVENOUS | Status: AC
  Administered 2013-10-06: 18:00:00 via INTRAVENOUS

## 2013-10-06 NOTE — Progress Notes (Signed)
I have read and agree with MS3 Manasi Tannu's note. Please see my progress note as well.  

## 2013-10-06 NOTE — Progress Notes (Signed)
Speech Language Pathology Treatment: Dysphagia  Patient Details Name: [name] MRN: [id number] DOB: [birth year] Today's Date: 10/06/2013 Time: 9485-4627 SLP Time Calculation (min): 8 min  Assessment / Plan / Recommendation Clinical Impression  Pt sleeping soundly on SLP arrival. Treatment focused on discussing results of MBS with granddaughter Adrian Saran. Discussed lack of functional gains with swallow on MBS yesterday. She confirms that Ms. Dowler wanted to drink regular water and asked for foods and drinks outside of diet recs. We discussed possibility of allowing water with known risk of aspiration for pts quality of life. Asked her to discuss that in palliative team meeting tomorrow. Will recommend current diet stay in place for now. SLP can f/u after meeting to reinforce strategies and precautions based on decisions at meeting.    HPI     Pertinent Vitals NA  SLP Plan  Continue with current plan of care    Recommendations Diet recommendations: Dysphagia 1 (puree);Honey-thick liquid Liquids provided via: Teaspoon Medication Administration: Crushed with puree Supervision: Full supervision/cueing for compensatory strategies;Staff to assist with self feeding;Trained caregiver to feed patient Compensations: Slow rate;Small sips/bites;Clear throat intermittently;Multiple dry swallows after each bite/sip Postural Changes and/or Swallow Maneuvers: Seated upright 90 degrees;Upright 30-60 min after meal              Oral Care Recommendations: Oral care BID Follow up Recommendations: Skilled Nursing facility Plan: Continue with current plan of care    GO    [facility], MA CCC-SLP [phone]  Claudine Mouton 10/06/2013, 11:17 AM

## 2013-10-06 NOTE — Consult Note (Signed)
Palliative Medicine team planned meeting 10/07/13 10am.  Will follow up after this to determine if further wound care or changes in POC need to be made based on the patient's family wishes.  Continue current wound care at this time. Jihad Brownlow [place], [place] [phone]

## 2013-10-06 NOTE — Progress Notes (Addendum)
CSW received voicemail from pt's granddaughter [name], and CSW called her back. [name] states interest in SNF, and CSW explained easiest way to determine what options pt has would be to send clinicals to all SNFs in [facility], as this is the [facility] is interested in for placement, and CSW will provide bed offers over the phone. CSW explained facilities only take a few hours to review clinicals, and [name] should anticipate a call back later this morning with bed offers.  Addendum: CSW provided bed options to pt's granddaughter. Granddaughter states she will tour the facilities today and will call CSW will her choice by the end of the day. CSW spoke with MD, who explains pt is not ready for discharge today due to high blood pressure, but potentially could go tomorrow. CSW following and will facilitate discharge when pt is deemed medically ready.  [name], MSW, [facility] Clinical Social Worker [phone]

## 2013-10-06 NOTE — Progress Notes (Signed)
Medical Student Daily Progress Note  Subjective: Pt seen at examined in afternoon. No acute events overnight. Some soft pressures overnight in 70s-80/40s-50s, but concern for volume status prevented fluid boluses being given. Today morning, BPs are stable in 110s/30s-60s. Pt sleeping during exam, but arousable and responsive to questions. No vomiting, changes in bowel habits per nurse. No family present on exam.  Objective: Vital signs in last 24 hours: Filed Vitals:   10/06/13 0400 10/06/13 0500 10/06/13 0900 10/06/13 1224  BP: 86/50  103/60 107/36  Pulse: 76  79 84  Temp: 98 F (36.7 C)  97.6 F (36.4 C) 98.2 F (36.8 C)  TempSrc: Oral  Axillary Oral  Resp: 23  20 26   Height:      Weight:  60.8 kg (134 lb 0.6 oz)    SpO2: 99%  100% 100%   Weight change:   Intake/Output Summary (Last 24 hours) at 10/06/13 1540 Last data filed at 10/06/13 1225  Gross per 24 hour  Intake    350 ml  Output    900 ml  Net   -550 ml   Physical Exam: Gen: No acute distress, laying in bed, somnolent but arousable, cachetic  Head: normocephalic, atraumatic Nose: no discharge Mouth/Throat: Oropharynx is clear, no exudate Eyes: decreased EOM, R eye cataract, L eye pupil has gray haze over it, can open both eyes Cardiac: irregularly irregular Pulm: clear to ausculation on anterior exam, posterior exam was difficult as patient is hard to move Abd: soft, nontender Ext: warm, well-perfused. Bilateral prevalon boots on feet Neuro: somnolent, but does respond to questions, decreased strength on L side s/p stroke Skin: sacral ulcer (could not assess due to dressing)   Lab Results:  Recent Labs Lab 10/01/13 1030 10/02/13 0025  AST 19 20  ALT 11 10  ALKPHOS 84 67  BILITOT 0.7 0.7  PROT 6.3 5.1*  ALBUMIN 2.4* 1.9*   No results found for this basename: LIPASE, AMYLASE,  in the last 168 hours No results found for this basename: AMMONIA,  in the last 168 hours  CBC:  Recent Labs Lab  10/01/13 1030 10/02/13 0025 10/02/13 0745 10/03/13 0400 10/05/13 0247  WBC 16.0* 13.7* 15.6* 13.5* 11.7*  NEUTROABS 13.6*  --   --  11.6*  --   HGB 12.4 10.2* 10.9* 9.8* 9.2*  HCT 37.1 30.9* 33.3* 29.6* 28.5*  MCV 96.9 96.3 96.0 94.6 95.3  PLT 196 137* 149* 123* 106*    Cardiac Enzymes:  Recent Labs Lab 10/01/13 1810 10/02/13 0025 10/02/13 0434  TROPONINI 0.73* 0.53* 0.46*    BNP: No components found with this basename: POCBNP,   CBG:  Recent Labs Lab 10/05/13 1220 10/05/13 1746 10/05/13 2102 10/06/13 0802 10/06/13 1226  GLUCAP 167* 201* 216* 195* 272*    Micro Results: Recent Results (from the past 240 hour(s))  CULTURE, BLOOD (ROUTINE X 2)     Status: None   Collection Time    10/01/13 10:30 AM      Result Value Range Status   Specimen Description BLOOD RIGHT FOREARM   Final   Special Requests BOTTLES DRAWN AEROBIC AND ANAEROBIC 5CCS   Final   Culture  Setup Time     Final   Value: 10/01/2013 18:19     Performed at [facility]   Culture     Final   Value:        BLOOD CULTURE RECEIVED NO GROWTH TO DATE CULTURE WILL BE HELD FOR 5 DAYS BEFORE ISSUING  A FINAL NEGATIVE REPORT     Performed at [facility]   Report Status PENDING   Incomplete  CULTURE, BLOOD (ROUTINE X 2)     Status: None   Collection Time    10/01/13 11:10 AM      Result Value Range Status   Specimen Description BLOOD LEFT ANTECUBITAL   Final   Special Requests BOTTLES DRAWN AEROBIC AND ANAEROBIC 5CC   Final   Culture  Setup Time     Final   Value: 10/01/2013 18:19     Performed at [facility]   Culture     Final   Value:        BLOOD CULTURE RECEIVED NO GROWTH TO DATE CULTURE WILL BE HELD FOR 5 DAYS BEFORE ISSUING A FINAL NEGATIVE REPORT     Performed at [facility]   Report Status PENDING   Incomplete  URINE CULTURE     Status: None   Collection Time    10/01/13 11:27 AM      Result Value Range Status   Specimen Description URINE, CATHETERIZED    Final   Special Requests NONE   Final   Culture  Setup Time     Final   Value: 10/01/2013 18:21     Performed at [facility] Count     Final   Value: >=100,000 COLONIES/ML     Performed at [facility]   Culture     Final   Value: ESCHERICHIA COLI     Performed at [facility]   Report Status 10/03/2013 FINAL   Final   Organism ID, Bacteria ESCHERICHIA COLI   Final   Studies/Results: Dg Swallowing Func-speech Pathology  10/06/2013   Riley Nearing Deblois, CCC-SLP     10/06/2013 11:11 AM Objective Swallowing Evaluation: Modified Barium Swallow Patient Details  Name: [name] MRN: [id number] Date of Birth: [birth year]  Today's Date: 10/05/2013 Time: 9417-4081 SLP Time Calculation (min): 23 min  Past Medical History:  Past Medical History  Diagnosis Date  . Diabetes mellitus   . Glaucoma   . A-fib   . Myocardial infarction     3.5x62mm Veriflex stent 02/03/2012  . Coronary artery disease   . Hypothyroidism   . CHF (congestive heart failure)   . Arthritis     RA  . Stroke 09/2013   Past Surgical History:  Past Surgical History  Procedure Laterality Date  . Eye surgery    . Cardiac catheterization  02/03/12  . Tonsillectomy     HPI:  Pt is a 80 yo F with h/o recent hemorrhagic stroke (right  lenticular nucleus hemorrhage with extension into the right  corona radiata), CHF, T2DM who presents with increased lethargy.  Pt found to have Sepsis possibly secondary to UTI. MD notes state  Pt also has history of aspiration, but given clear CXR,  aspiration pneumonia is unlikely source of infection. MBS on 1/8  recommended puree and pudding. Pt remained lethargic during  admission and was d/c'd before demosntrating any functional  gains. Grandaughter reports she was consuming pureed solids and  honey thick liquids at home but speech therapist was trialing  upgraded textures with success.      Assessment / Plan / Recommendation Clinical Impression  Dysphagia Diagnosis: Moderate  oral phase dysphagia;Severe  pharyngeal phase dysphagia Clinical impression: Pt demonstrates little functional change  since last MBS recommending pudding thick liquids. Pt continues  to have 5-10 second periods of  tongue pumping with spillage of  liquids to pyriforms and into airway prior to the swallow with  silent aspiration before and after the swallow (of residuals),   Pt can expel penetrate with cues to clear throat, but is unable  to complete a second swallow in a timely manner to transit  residuals before repenetration. Though there is still some trace  penetration with honey teaspoon, pt has been tolerating this diet  at home. Recommend she continue a dys 1 ( puree) texture with  honey thick liquids via teaspoon. There is potential to upgrade  to ground diet if it would improve QOL. SLP will follow for  tolerance, trials and family ed.     Treatment Recommendation  Therapy as outlined in treatment plan below    Diet Recommendation Dysphagia 1 (Puree);Honey-thick liquid   Liquid Administration via: Spoon Medication Administration: Crushed with puree Supervision: Full supervision/cueing for compensatory  strategies;Staff to assist with self feeding;Trained caregiver to  feed patient Compensations: Slow rate;Small sips/bites;Clear throat  intermittently;Multiple dry swallows after each bite/sip Postural Changes and/or Swallow Maneuvers: Seated upright 90  degrees;Upright 30-60 min after meal    Other  Recommendations Oral Care Recommendations: Oral care BID Other Recommendations: Order thickener from pharmacy   Follow Up Recommendations  Home health SLP    Frequency and Duration min 2x/week  2 weeks   Pertinent Vitals/Pain NA    SLP Swallow Goals     General HPI: Pt is a 80 yo F with h/o recent hemorrhagic stroke  (right lenticular nucleus hemorrhage with extension into the  right corona radiata), CHF, T2DM who presents with increased  lethargy. Pt found to have Sepsis possibly secondary to UTI. MD  notes  state Pt also has history of aspiration, but given clear  CXR, aspiration pneumonia is unlikely source of infection. MBS on  1/8 recommended puree and pudding. Pt remained lethargic during  admission and was d/c'd before demosntrating any functional  gains. Grandaughter reports she was consuming pureed solids and  honey thick liquids at home but speech therapist was trialing  upgraded textures with success.  Type of Study: Bedside swallow evaluation Reason for Referral: Objectively evaluate swallowing function Previous Swallow Assessment: MBS1/7 Diet Prior to this Study: Dysphagia 1 (puree);Honey-thick liquids Temperature Spikes Noted: No Respiratory Status: Nasal cannula History of Recent Intubation: No Behavior/Cognition: Alert;Cooperative;Requires cueing Oral Cavity - Dentition: Edentulous Oral Motor / Sensory Function: Impaired - see Bedside swallow  eval Self-Feeding Abilities: Total assist Patient Positioning: Postural control interferes with function Baseline Vocal Quality: Clear Volitional Cough: Weak Volitional Swallow: Able to elicit Anatomy:  (Appearance of bony protrusions at C3/4 C4/5) Pharyngeal Secretions: Not observed secondary MBS    Reason for Referral Objectively evaluate swallowing function   Oral Phase Oral Preparation/Oral Phase Oral Phase: Impaired Oral - Honey Oral - Honey Teaspoon: Reduced posterior propulsion;Lingual  pumping;Right pocketing in lateral sulci;Left pocketing in  lateral sulci;Delayed oral transit Oral - Nectar Oral - Nectar Teaspoon: Reduced posterior propulsion;Lingual  pumping;Right pocketing in lateral sulci;Left pocketing in  lateral sulci;Delayed oral transit Oral - Nectar Cup: Reduced posterior propulsion;Lingual  pumping;Right pocketing in lateral sulci;Left pocketing in  lateral sulci;Delayed oral transit Oral - Nectar Straw: Reduced posterior propulsion;Lingual  pumping;Right pocketing in lateral sulci;Left pocketing in  lateral sulci;Delayed oral transit Oral -  Solids Oral - Puree: Reduced posterior propulsion;Lingual pumping;Right  pocketing in lateral sulci;Left pocketing in lateral  sulci;Delayed oral transit Oral - Mechanical Soft: Reduced posterior propulsion;Lingual  pumping;Right pocketing in lateral sulci;Left  pocketing in  lateral sulci;Delayed oral transit   Pharyngeal Phase Pharyngeal Phase Pharyngeal Phase: Impaired Pharyngeal - Honey Pharyngeal - Honey Teaspoon: Premature spillage to pyriform  sinuses;Delayed swallow initiation;Penetration/Aspiration after  swallow;Trace aspiration;Pharyngeal residue -  valleculae;Pharyngeal residue - pyriform sinuses Penetration/Aspiration details (honey teaspoon): Material enters  airway, CONTACTS cords and not ejected out;Material enters  airway, remains ABOVE vocal cords and not ejected out Pharyngeal - Nectar Pharyngeal - Nectar Teaspoon: Delayed swallow  initiation;Premature spillage to pyriform  sinuses;Penetration/Aspiration before swallow;Trace  aspiration;Pharyngeal residue - valleculae;Pharyngeal residue -  pyriform sinuses Penetration/Aspiration details (nectar teaspoon): Material enters  airway, passes BELOW cords without attempt by patient to eject  out (silent aspiration) Pharyngeal - Nectar Cup: Not tested (pt could not position head  for cup sip) Pharyngeal - Nectar Straw: Delayed swallow initiation;Premature  spillage to pyriform sinuses;Penetration/Aspiration before  swallow;Significant aspiration (Amount);Pharyngeal residue -  valleculae;Pharyngeal residue - pyriform sinuses Penetration/Aspiration details (nectar straw): Material enters  airway, passes BELOW cords without attempt by patient to eject  out (silent aspiration) Pharyngeal - Solids Pharyngeal - Puree: Delayed swallow initiation;Premature spillage  to valleculae Pharyngeal - Mechanical Soft: Delayed swallow  initiation;Premature spillage to valleculae  Cervical Esophageal Phase    GO             Harlon Ditty, MA CCC-SLP [phone]  [place],  Riley Nearing 10/05/2013, 10:45 AM    Medications:  Scheduled Meds: . allopurinol  100 mg Oral Daily  . brimonidine  1 drop Both Eyes Q12H   And  . timolol  1 drop Both Eyes BID  . brinzolamide  1 drop Both Eyes TID  . collagenase   Topical Daily  . feeding supplement (ENSURE)  1 Container Oral TID BM  . insulin aspart  0-15 Units Subcutaneous TID WC  . latanoprost  1 drop Both Eyes QHS  . [START ON 10/07/2013] levofloxacin  250 mg Oral Q24H  . levofloxacin  500 mg Oral Daily  . levothyroxine  125 mcg Oral QAC breakfast  . methotrexate  10 mg Oral Weekly  . prednisoLONE acetate  1 drop Right Eye BID  . simvastatin  10 mg Oral q1800  . sodium chloride  3 mL Intravenous Q12H  . vitamin B-12  1,000 mcg Oral Daily   Continuous Infusions: . dextrose     PRN Meds:.acetaminophen, acetaminophen, docusate sodium, nitroGLYCERIN, ondansetron (ZOFRAN) IV, ondansetron, RESOURCE THICKENUP CLEAR Assessment/Plan:  Ms. Shelden is a 80 year old female with PMH of HTN, hyperlipidemia, DM2, systolic CHF (30-35% on 09/26/13), CAD s/p MI and stent, atrial fibrillation, CVA Jan 215, Hypothyroidism on replacement, rheumatoid arthritis, and gout admitted for severe sepsis.   Sepsis 2/2 Ecoli UTI: Soft BPs overnight, but BPs remain stable in the 110s/302-60s this morning. Feel that pt's pressures in the morning are good, but worried that she is dropping overnight. Have not given her fluid boluses because of CHF and fear of volume overload. Will continue to monitor her blood pressures overnight to see if they stabilize in the 100s/40s-50s. If they do not, will give another fluid bolus. Other than this, patient has been afebrile since admission (tmax 100.9 on admission), with WBC trending down. Transitioned from IV Rocephin to Oral Levofloxacin today per pharmacy recommendation. Pharmacy said oral levo is used to treat complicated UTI over Oral cefdin which is only used for uncomplicated UTI. Pt is on Day 6 of  antibiotics out of 10. -BPs: 100s-110s/30s-60s in the morning. Requires continued close monitoring. -Blood culture x2: NGTD -Dressing changes per wound  care consult. -Transitioned from IV Rocephin to oral Levofloxacin 500 mg today. Will receive 500 mgs today and 250mg  for three days after today per pharmacy consult because pt's CrCl is 39. -Palliative care meeting tomorrow at 10am to discuss goals of care with family. Granddaughter Merrilyn Puma is power of attorney.   Hypertonic Hypernatremia-- Improving from admission. Na from 146 yesterday to 150 today. Will restart D5W to correct for this.  Recent Labs     10/05/13  1215  10/05/13  1725  10/05/13  2245  10/06/13  0347  10/06/13  1039  NA  147  146  147  148*  150*  -D5W at 75cc/hr for 10 hrs to bring down Na. -BMETs q6h  Anion Gap - improving. Pt with AG of 20 on admission most likely due to lactic acidosis due to sepsis, now 13  -Continue to monitor   Hypoalbuminemia and severe malnutrition - Pt with severe malnutrition in the context of chronic illness and albumin of 1.9.  -Appreciate nutrition following  -Ensure pudding PO TID, dys 1 diet for now   Atrial Fibrillation--remains in rate controlled Afib. On carvedilol 6.25 mg BID at home and no AC therapy (contra-indicated given intracranial hemorrhage s/p TPA for infarct 2 weeks prior). Was briefly in HR 130-140's yesterday evening but spontaneously resolved. HR 70's today.  -Holding carvedilol 6.25 mg BID in setting hypotension. Will need to consider whether to restart carvedilol on discharge.  Systolic Congestive Failure--2D-echo on 09/16/13 with reduced EF of 30-35% and Akinesis of the entireanterior myocardium. LV diastolic could not be assessed. -Obtain daily weights and I&O's. Net -600 in past 24 hours -Caution with IVF's. Concern for fluid overload yesterday because arms were puffy, but today patient looks dry.   Ischemic Stroke with hemorrhagic conversion--hemorrhagic conversion  of a right lentiform nucleus infarct s/p IV tPA, infarct embolic secondary to atrial fibrillation during last hospitalization 1/6-1/12. Dr. Roseanne Reno (neurology) on admission said head CT was not isodense,  -CT Head w/o contrast ---> expected evolution of intracranial hemorrhage  -Discussed with neurology on admission, per Dr. Roseanne Reno not isodense, so did not start [facility] or AP on admission. -Per Dr. Pearlean Brownie, obtain head CT on Friday (1/30) and contact him with results to determine whether to restart [facility] therapy. Scheduled outpatient follow-up with Dr. Pearlean Brownie for 2 months per his request.  -Continued to hold AP and [facility] for now -Ordered Heat CT for Friday 1/30 -PT/OT--SNF   Coronary Artery Disease - Pt with acute anterolateral myocardial infarction that occurred on 02/03/2012 with successful PTCA and stenting of the mid LAD with a non-drug-eluting 3.5 x 24 mm Veriflex stent. CE trended down  -Continue home pravastatin 20 mg daily  -Nitroglycerin as needed for CP  -tele-monitoring   DM2--CBG's elevated in setting of D5W. HbA1c on 6.5 on 09/16/13. Pt on metformin 500 mg BID at home  -Hold home metformin 500 mg BID  -Monitor glucose frequently  -SSI   Hypothyroidism  -Continue levothyroxine 125 mcg daily  -TSH 0.664   Rheumatoid Arthritis--on methotrexate at home 10mg  q weekly  -Will resume methotrexate (q saturdays). Okay to give MTX per pharmacy as long as patient is not actively septic. Gout - currently stable  -Continue allopurinol 100 mg daily   Chronic Thrombocytopenia--Platelet count on admission of 196K with baseline 100-110K. Down to 123 today.  -Continue to monitor  -Holding AP and AC therapy   Acute Kidney Injury--resolved. Cr 1.17 on admission and down to 0.71 today.  This is a Administrator  Student Note.  The care of the patient was discussed with Dr. Johna Roles and the assessment and plan formulated with their assistance.  Please see their attached note for official documentation of the daily  encounter.  Jaan Fischel 10/06/2013, 3:40 PM

## 2013-10-06 NOTE — Progress Notes (Addendum)
Clinical Social Work Department CLINICAL SOCIAL WORK PLACEMENT NOTE 10/06/2013  Patient:  NAOMEE, NOWLAND  Account Number:  [id number] Admit date:  10/01/2013  Clinical Social Worker:  Maryclare Labrador, Theresia Majors  Date/time:  10/06/2013 08:50 AM  Clinical Social Work is seeking post-discharge placement for this patient at the following level of care:   SKILLED NURSING   (*CSW will update this form in Epic as items are completed)   N/A-granddaughter agreeable to CSW sending clinicals to all SNFs in [facility] and providing bed offers over the phone  Patient/family provided with [facility] Department of Clinical Social Work's list of facilities offering this level of care within the geographic area requested by the patient (or if unable, by the patient's family).  10/06/2013  Patient/family informed of their freedom to choose among providers that offer the needed level of care, that participate in Medicare, Medicaid or managed care program needed by the patient, have an available bed and are willing to accept the patient.  N/A-clinicals not sent to this SNF  Patient/family informed of [facility]' ownership interest in [facility], as well as of the fact that they are under no obligation to receive care at this facility.  PASARR submitted to EDS on EXISTING PASARR number received from EDS on EXISTING  FL2 transmitted to all facilities in geographic area requested by pt/family on  10/06/2013 FL2 transmitted to all facilities within larger geographic area on   Patient informed that his/her managed care company has contracts with or will negotiate with  certain facilities, including the following:     Patient/family informed of bed offers received:  10/06/2013 Patient chooses bed at [facility] Physician recommends and patient chooses bed at    Patient to be transferred to [facility] on   Patient to be transferred to facility by   The following  physician request were entered in Epic:   Additional Comments:  Maryclare Labrador, MSW, [facility] Clinical Social Worker [phone]

## 2013-10-06 NOTE — Progress Notes (Signed)
Thank you for consulting the Palliative Medicine Team at [facility] to meet your patient's and family's needs.   The reason that you asked [place] to see your patient is  For GOC  We have scheduled your patient for a meeting:  1/28 at 10 am  The Surrogate decision make is Granddaughter Administrator information: [phone]  Other family members that need to be present:     Your patient is able/unable to participate: unable  Additional Narrative:     Timouthy Gilardi L. Ladona Ridgel, MD MBA The Palliative Medicine Team at [facility] Phone: [phone] Pager: [phone]

## 2013-10-06 NOTE — Progress Notes (Addendum)
Subjective:  Pt seen and examined in AM.  Pt with low-normal blood pressures overnight. Pt somnolent but arousable. Pt denies fever, chills, weakness, nausea, vomiting, abdominal pain, or change in BM or urination. No family present.   Objective: Vital signs in last 24 hours: Filed Vitals:   10/06/13 0400 10/06/13 0500 10/06/13 0900 10/06/13 1224  BP: 86/50  103/60 107/36  Pulse: 76  79 84  Temp: 98 F (36.7 C)  97.6 F (36.4 C) 98.2 F (36.8 C)  TempSrc: Oral  Axillary Oral  Resp: 23  20 26   Height:      Weight:  60.8 kg (134 lb 0.6 oz)    SpO2: 99%  100% 100%   Weight change:   Intake/Output Summary (Last 24 hours) at 10/06/13 1341 Last data filed at 10/06/13 1225  Gross per 24 hour  Intake    420 ml  Output    900 ml  Net   -480 ml    Constitutional: She is oriented to person, place, and time. No distress.  Cachetic appearing  Head: Normocephalic and atraumatic.  Nose: Nose normal.  Mouth/Throat: Oropharynx is clear and moist. No oropharyngeal exudate.  Eyes:  EOM are normal Neck: Normal range of motion. Neck supple.  Cardiovascular: Normal rate. Irregular rhythm  Pulmonary/Chest: Effort normal and breath sounds normal. Anterior breath fields clear  Abdominal: Soft. Bowel sounds are normal. She exhibits no distension. There is no tenderness. There is no rebound and no guarding.  Musculoskeletal: improved left MCP swelling with no overlying warmth, erythema, or tenderness to palpation; SCDs on bilateral legs and boots on feet Neurological: She is alert and oriented to person, place, and time. No cranial nerve deficit.  Slow to respond to questions, no slurred speech. Left UE and LE 3/5 weakness, otherwise 4/5 throughout; No sensation of left UE or LE.  Skin: Skin is warm and dry. She is not diaphoretic.  Sacral wound ulcer covered by dressing     Lab Results: Basic Metabolic Panel:  Recent Labs Lab 10/03/13 0400  10/06/13 0347 10/06/13 1039  NA  154*  < > 148* 150*  K 3.4*  < > 3.5* 4.0  CL 117*  < > 114* 115*  CO2 21  < > 24 25  GLUCOSE 313*  < > 224* 271*  BUN 33*  < > 15 15  CREATININE 0.78  < > 0.72 0.72  CALCIUM 8.9  < > 8.7 8.9  MG 1.8  --   --   --   < > = values in this interval not displayed. Liver Function Tests:  Recent Labs Lab 10/01/13 1030 10/02/13 0025  AST 19 20  ALT 11 10  ALKPHOS 84 67  BILITOT 0.7 0.7  PROT 6.3 5.1*  ALBUMIN 2.4* 1.9*   CBC:  Recent Labs Lab 10/01/13 1030  10/03/13 0400 10/05/13 0247  WBC 16.0*  < > 13.5* 11.7*  NEUTROABS 13.6*  --  11.6*  --   HGB 12.4  < > 9.8* 9.2*  HCT 37.1  < > 29.6* 28.5*  MCV 96.9  < > 94.6 95.3  PLT 196  < > 123* 106*  < > = values in this interval not displayed. Cardiac Enzymes:  Recent Labs Lab 10/01/13 1810 10/02/13 0025 10/02/13 0434  TROPONINI 0.73* 0.53* 0.46*   CBG:  Recent Labs Lab 10/05/13 0755 10/05/13 1220 10/05/13 1746 10/05/13 2102 10/06/13 0802 10/06/13 1226  GLUCAP 169* 167* 201* 216* 195* 272*  Thyroid Function Tests:  Recent Labs Lab 10/01/13 1810  TSH 0.664   Coagulation:  Recent Labs Lab 10/02/13 0025  LABPROT 18.2*  INR 1.55*   Urine Drug Screen: Drugs of Abuse     Component Value Date/Time   LABOPIA NONE DETECTED 09/15/2013 1709   COCAINSCRNUR NONE DETECTED 09/15/2013 1709   LABBENZ NONE DETECTED 09/15/2013 1709   AMPHETMU NONE DETECTED 09/15/2013 1709   THCU NONE DETECTED 09/15/2013 1709   LABBARB NONE DETECTED 09/15/2013 1709    Urinalysis:  Recent Labs Lab 10/01/13 1127  COLORURINE ORANGE*  LABSPEC 1.024  PHURINE 5.0  GLUCOSEU NEGATIVE  HGBUR SMALL*  BILIRUBINUR MODERATE*  KETONESUR 15*  PROTEINUR 30*  UROBILINOGEN 1.0  NITRITE NEGATIVE  LEUKOCYTESUR MODERATE*   Micro Results: Recent Results (from the past 240 hour(s))  CULTURE, BLOOD (ROUTINE X 2)     Status: None   Collection Time    10/01/13 10:30 AM      Result Value Range Status   Specimen Description BLOOD RIGHT FOREARM    Final   Special Requests BOTTLES DRAWN AEROBIC AND ANAEROBIC 5CCS   Final   Culture  Setup Time     Final   Value: 10/01/2013 18:19     Performed at [facility]   Culture     Final   Value:        BLOOD CULTURE RECEIVED NO GROWTH TO DATE CULTURE WILL BE HELD FOR 5 DAYS BEFORE ISSUING A FINAL NEGATIVE REPORT     Performed at [facility]   Report Status PENDING   Incomplete  CULTURE, BLOOD (ROUTINE X 2)     Status: None   Collection Time    10/01/13 11:10 AM      Result Value Range Status   Specimen Description BLOOD LEFT ANTECUBITAL   Final   Special Requests BOTTLES DRAWN AEROBIC AND ANAEROBIC 5CC   Final   Culture  Setup Time     Final   Value: 10/01/2013 18:19     Performed at [facility]   Culture     Final   Value:        BLOOD CULTURE RECEIVED NO GROWTH TO DATE CULTURE WILL BE HELD FOR 5 DAYS BEFORE ISSUING A FINAL NEGATIVE REPORT     Performed at [facility]   Report Status PENDING   Incomplete  URINE CULTURE     Status: None   Collection Time    10/01/13 11:27 AM      Result Value Range Status   Specimen Description URINE, CATHETERIZED   Final   Special Requests NONE   Final   Culture  Setup Time     Final   Value: 10/01/2013 18:21     Performed at [facility] Count     Final   Value: >=100,000 COLONIES/ML     Performed at [facility]   Culture     Final   Value: ESCHERICHIA COLI     Performed at [facility]   Report Status 10/03/2013 FINAL   Final   Organism ID, Bacteria ESCHERICHIA COLI   Final   Medications: I have reviewed the patient's current medications. Scheduled Meds: . allopurinol  100 mg Oral Daily  . brimonidine  1 drop Both Eyes Q12H   And  . timolol  1 drop Both Eyes BID  . brinzolamide  1 drop Both Eyes TID  . cefTRIAXone (ROCEPHIN)  IV  1 g Intravenous  Q24H  . collagenase   Topical Daily  . feeding supplement (ENSURE)  1 Container Oral TID BM  . insulin aspart  0-15 Units  Subcutaneous TID WC  . latanoprost  1 drop Both Eyes QHS  . levothyroxine  125 mcg Oral QAC breakfast  . methotrexate  10 mg Oral Weekly  . prednisoLONE acetate  1 drop Right Eye BID  . simvastatin  10 mg Oral q1800  . sodium chloride  3 mL Intravenous Q12H  . vitamin B-12  1,000 mcg Oral Daily   Continuous Infusions:   PRN Meds:.acetaminophen, acetaminophen, docusate sodium, nitroGLYCERIN, ondansetron (ZOFRAN) IV, ondansetron, RESOURCE THICKENUP CLEAR  Assessment: 81 year old woman with past medical history of hypertension, hyperlipidemia, non-insulin Type 2 DM, systolic CHF (30-35% on 09/26/13), CAD s/p MI and stent, atrial fibrillation, CVA Jan 215, Hypothyroidism on replacement, rheumatoid arthritis, and gout who presented on  1/22 with altered mental status per family and found to be in sepsis.   Plan:   Sepsis most likely due to E. Coli UTI - improving.  Leukocytosis improved, however still tachypneic and with low-normal blood pressure.  Pt presented with SIRS+4 (T 100.9, HR 122, RR 28, WBC 16K with neutrophilia) and found to be hypotensive with blood pressure of 80/33 requiring fluid resuscitation. Lactic acid was elevated at 2.9 on admission which normalized. Source of infection unclear but most likely due to E. Coli UTI vs unstagable sacral pressure ulcer. Pt was found to be influenza negative with no acute cardiopulmonary disease on chest xray.  -Administer fluid bolus (NS to 1/2 NS) as needed for hypotension, hold anti-hypertensives  -Obtain blood cultures x 2 --> NGTD  -Appreciate wound care consult  -Transition  IV ceftriaxone (total 6/10 days of antibiotics) to levaquin 500 mg today then 250 mg daily  for pansensitive  E. Coli UTI   -Leukocytosis resolving (16K to 11.7) -Obtain PT consult --> SNF -Palliative care consult --> meeting tomm   Hypovolemic Hypernatremia - Serum osmolarity of 336, urine osmolality of 689 with sodium on admission of 155, now 150. Most likely due to  hypovolemia in setting of hypotension.  -Administer NS or 0.5NS for volume repletion then D5% with correction of Na of 10-12 mEq in 24 hr period with goal <145 -Continue to monitor   Elevated Troponin - Pt with elevated troponin of 0.73 to 0.53 to 0.46 most likely due to demand ischemia in setting of sepsis. Pt also with history of MI s/p stent on 02/03/12.  -Pt is not candidate for [facility] or AP therapy due to recent hemorrhagic stroke -Supportive care   Unstageable Sacral Pressure Ulcer - Pt with unstagable pressure wound ulcer without subcutaneous fat/ tendon/bone penetration of 1 week duration due to bed bound status after ischemic stroke prior this month.  -Appreciate wound care consult -Continue air mattress, prevalon boots, enzymatic debridement ointment and moist gauze   Hypoalbuminemia and severe malnutrition - Pt with severe malnutrition in the context of chronic illness and albumin of 1.9.  -Appreciate nutrition consult --> dysphagia 1  -Ensure pudding PO TID   Atrial Fibrillation - currently in atrial fibrillation with normal HR on carvedilol 6.25 mg BID for rate control and no AC therapy due to recent hemorrhagic stroke -Hold carvedilol 6.25 mg BID in setting of hypotension -Continue b/l SCD's -Appreciate neurology recommendations for AP or AC therapy -->Per Dr. Pearlean Brownie, obtain CT head w/o cont on 1/30 or on day of discharge if sooner, will then decide upon whether to resume AP/AC therapy and  f/u appt in 2 months   Systolic Congestive Failure - Last 2D-echo on 09/16/13 with reduced EF of 30-35% and akinesis of the entire anterior myocardium. LV diastolic could not be assessed. -Obtain daily weight (125 to 133b on 1/24) and I &O's (700 mL urine output yesterday)  -Caution with IVF's   Ischemic Stroke with hemorrhagic conversion - Pt with hemorrhagic conversion of a right lentiform nucleus infarct s/p IV tPA, infarct embolic secondary to atrial fibrillation during last hospitalization  1/6-1/12.  CT Head w/o contrast with expected evolution of intracranial hemorrhage and not isodense. -Per Dr. Pearlean Brownie, obtain CT head w/o cont on 1/30 or on day of discharge if sooner, will then decide upon whether to resume AP/AC therapy and f/u appt in 2 months   Coronary Artery Disease - Pt with acute anterolateral myocardial infarction that occurred on 02/03/2012 with successful PTCA and stenting of the mid LAD with a non-drug-eluting 3.5 x 24 mm Veriflex stent.  -Continue home pravastatin 20 mg daily  -Nitroglycerin as needed for CP   Non-insulin-Type II Diabetes Mellitus - CBG 272. HbA1c on 6.5 on 09/16/13. Pt on metformin 500 mg BID at home.  -Hold home metformin 500 mg BID during hospitalization  -Monitor glucose frequently  -Continue moderate insulin sliding scale -If uncontrolled consider adding Lantus    Hypertension - Currently low to normotensive  -Hold home rampiril 1.25 mg daily in setting of hypotension  -Hold carvedilol 6.25 mg BID in setting of hypotension  Hypothyroidism - Last TSH 1.327 on 02/03/12.  -Continue levothyroxine 125 mcg daily  -Obtain TSH --> wnl   Rheumatoid Arthritis -currently with mild b/l MCP swelling without joint pain, warmth, or erythema  -Hold home methotrexate 10 mg weekly in setting of infection, resume on Saturday  Gout - currently stable without flare  -Continue allopurinol 100 mg daily   Chronic Thrombocytopenia -  Pt with no active bleeding. Platelet count on admission of 196K with baseline 100-110K. -Continue to monitor  -Hold AP and AC therapy   Anion Gap - Resolved. Pt with AG of 20 on admission most likely due to lactic acidosis due to sepsis, now closed to 10 most likely due to elevated lactic acid due to sepsis.     -Continue to monitor  Hypokalemia -  Resolved. Pt with mild potassium of 3.4 most likely due to decreased PO intake. s hypomagnesemia  -Supplement with KCl as needed -Continue to monitor   Acute Kidney Injury -  Resolved. Pt with last Cr was 0.78 on 1/10, on admission 1.17, now 0.72. Most likely due to pre-renal azotemia (hypolemia) as resolved with administration of IVF's and FeNa (0.2%) <1%  -Hold home ramipril  -Obtain daily weight (125 to 133b on 1/24) and I &O's (700 mL urine output yesterday)  -Maintain foley catheter    Diet: Dysphagia 1  DVT PPx: SCDs  Code: DNR/DNI     Dispo: Disposition is deferred at this time, awaiting improvement of current medical problems.  Nee to have goals of care discussion with family. Anticipated discharge is unknown.   The patient does have a current PCP Milana Obey, MD) and does need an [facility] hospital follow-up appointment after discharge.  The patient does not have transportation limitations that hinder transportation to clinic appointments.  Services Needed at time of discharge: Y = Yes, Blank = No PT: SNF  OT:   RN:   Equipment:   Other:     LOS: 5 days   Otis Brace, MD 10/06/2013,  1:41 PM

## 2013-10-07 DIAGNOSIS — N390 Urinary tract infection, site not specified: Secondary | ICD-10-CM | POA: Diagnosis present

## 2013-10-07 DIAGNOSIS — Z515 Encounter for palliative care: Secondary | ICD-10-CM

## 2013-10-07 DIAGNOSIS — Z7189 Other specified counseling: Secondary | ICD-10-CM

## 2013-10-07 DIAGNOSIS — E46 Unspecified protein-calorie malnutrition: Secondary | ICD-10-CM

## 2013-10-07 DIAGNOSIS — Z66 Do not resuscitate: Secondary | ICD-10-CM | POA: Diagnosis present

## 2013-10-07 DIAGNOSIS — I509 Heart failure, unspecified: Secondary | ICD-10-CM

## 2013-10-07 LAB — BASIC METABOLIC PANEL
BUN: 13 mg/dL (ref 6–23)
CO2: 23 mEq/L (ref 19–32)
CREATININE: 0.66 mg/dL (ref 0.50–1.10)
Calcium: 8.7 mg/dL (ref 8.4–10.5)
Chloride: 115 mEq/L — ABNORMAL HIGH (ref 96–112)
GFR calc Af Amer: 87 mL/min — ABNORMAL LOW (ref >90)
GFR, EST NON AFRICAN AMERICAN: 75 mL/min — AB (ref >90)
Glucose, Bld: 177 mg/dL — ABNORMAL HIGH (ref 70–99)
Potassium: 3.5 mEq/L — ABNORMAL LOW (ref 3.7–5.3)
Sodium: 150 mEq/L — ABNORMAL HIGH (ref 137–147)

## 2013-10-07 LAB — CULTURE, BLOOD (ROUTINE X 2)
CULTURE: NO GROWTH
Culture: NO GROWTH

## 2013-10-07 LAB — MAGNESIUM: Magnesium: 1.5 mg/dL (ref 1.5–2.5)

## 2013-10-07 LAB — CBC
HEMATOCRIT: 28.5 % — AB (ref 36.0–46.0)
HEMOGLOBIN: 9.4 g/dL — AB (ref 12.0–15.0)
MCH: 31.1 pg (ref 26.0–34.0)
MCHC: 33 g/dL (ref 30.0–36.0)
MCV: 94.4 fL (ref 78.0–100.0)
Platelets: 104 10*3/uL — ABNORMAL LOW (ref 150–400)
RBC: 3.02 MIL/uL — AB (ref 3.87–5.11)
RDW: 16.6 % — ABNORMAL HIGH (ref 11.5–15.5)
WBC: 10.1 10*3/uL (ref 4.0–10.5)

## 2013-10-07 LAB — GLUCOSE, CAPILLARY
GLUCOSE-CAPILLARY: 162 mg/dL — AB (ref 70–99)
GLUCOSE-CAPILLARY: 141 mg/dL — AB (ref 70–99)
Glucose-Capillary: 164 mg/dL — ABNORMAL HIGH (ref 70–99)

## 2013-10-07 MED ORDER — MORPHINE SULFATE (CONCENTRATE) 10 MG /0.5 ML PO SOLN: 5.0000 mg | ORAL | Status: DC | PRN

## 2013-10-07 MED ORDER — DEXTROSE 5 % IV SOLN: INTRAVENOUS | Status: DC

## 2013-10-07 MED ORDER — ACETAMINOPHEN 325 MG PO TABS
650.0000 mg | ORAL_TABLET | Freq: Three times a day (TID) | ORAL | Status: DC
  Administered 2013-10-07 – 2013-10-08 (×4): 650 mg via ORAL
  Filled 2013-10-07 (×5): qty 2

## 2013-10-07 NOTE — Progress Notes (Signed)
Medical Student Daily Progress Note  Subjective: Pt seen and examined in the AM. She was much more vocal today and expressed interest in drinking because she was thirsty. She is visibly less somnolent than yesterday. No acute events overnight. She had one bowel movement. She does not endorse any pain, nausea, vomiting or diarrhea.  Objective: Vital signs in last 24 hours: Filed Vitals:   10/07/13 0000 10/07/13 0429 10/07/13 0758 10/07/13 1200  BP: 87/41 115/47 133/61 131/49  Pulse: 62 60 76 73  Temp: 97.7 F (36.5 C) 98.5 F (36.9 C) 97.6 F (36.4 C) 97.4 F (36.3 C)  TempSrc: Oral Oral Axillary Axillary  Resp: 20  25 24   Height:      Weight:  60.1 kg (132 lb 7.9 oz)    SpO2: 100% 100% 99% 100%   Weight change: -0.7 kg (-1 lb 8.7 oz)  Intake/Output Summary (Last 24 hours) at 10/07/13 1453 Last data filed at 10/07/13 1300  Gross per 24 hour  Intake 1572.5 ml  Output    250 ml  Net 1322.5 ml   Physical Exam: Gen: laying in bed, less somnolent than previous exam, still responds slowly to questions HENT: Head: atraumatic, normocephalic Mouth/Throat: No oropharyngeal exudates, no erythema, no teeth visible Cardio: irregularly irregular Pulm/Chest: limited posterior exam because patient was hard to reposition. Anterior exam revealed no crackles. Abd: soft, nontender Back: sacral ulcer and wound on upper back - could not visualize wound because dressing was in place Extremities: prevalon boots in place on bilateral legs, no edema on LE or UE  Lab Results:  Recent Labs Lab 10/01/13 1030 10/02/13 0025  AST 19 20  ALT 11 10  ALKPHOS 84 67  BILITOT 0.7 0.7  PROT 6.3 5.1*  ALBUMIN 2.4* 1.9*   CBC:  Recent Labs Lab 10/01/13 1030 10/02/13 0025 10/02/13 0745 10/03/13 0400 10/05/13 0247 10/07/13 0355  WBC 16.0* 13.7* 15.6* 13.5* 11.7* 10.1  NEUTROABS 13.6*  --   --  11.6*  --   --   HGB 12.4 10.2* 10.9* 9.8* 9.2* 9.4*  HCT 37.1 30.9* 33.3* 29.6* 28.5* 28.5*  MCV  96.9 96.3 96.0 94.6 95.3 94.4  PLT 196 137* 149* 123* 106* 104*    Cardiac Enzymes:  Recent Labs Lab 10/01/13 1810 10/02/13 0025 10/02/13 0434  TROPONINI 0.73* 0.53* 0.46*    CBG:  Recent Labs Lab 10/06/13 0802 10/06/13 1226 10/06/13 1735 10/06/13 2021 10/07/13 0814  GLUCAP 195* 272* 162* 121* 164*    Micro Results: Recent Results (from the past 240 hour(s))  CULTURE, BLOOD (ROUTINE X 2)     Status: None   Collection Time    10/01/13 10:30 AM      Result Value Range Status   Specimen Description BLOOD RIGHT FOREARM   Final   Special Requests BOTTLES DRAWN AEROBIC AND ANAEROBIC 5CCS   Final   Culture  Setup Time     Final   Value: 10/01/2013 18:19     Performed at [facility]   Culture     Final   Value: NO GROWTH 5 DAYS     Performed at [facility]   Report Status 10/07/2013 FINAL   Final  CULTURE, BLOOD (ROUTINE X 2)     Status: None   Collection Time    10/01/13 11:10 AM      Result Value Range Status   Specimen Description BLOOD LEFT ANTECUBITAL   Final   Special Requests BOTTLES DRAWN AEROBIC AND ANAEROBIC 5CC  Final   Culture  Setup Time     Final   Value: 10/01/2013 18:19     Performed at [facility]   Culture     Final   Value: NO GROWTH 5 DAYS     Performed at [facility]   Report Status 10/07/2013 FINAL   Final  URINE CULTURE     Status: None   Collection Time    10/01/13 11:27 AM      Result Value Range Status   Specimen Description URINE, CATHETERIZED   Final   Special Requests NONE   Final   Culture  Setup Time     Final   Value: 10/01/2013 18:21     Performed at [facility] Count     Final   Value: >=100,000 COLONIES/ML     Performed at [facility]   Culture     Final   Value: ESCHERICHIA COLI     Performed at [facility]   Report Status 10/03/2013 FINAL   Final   Organism ID, Bacteria ESCHERICHIA COLI   Final   Studies/Results: No results found. Medications:   Scheduled Meds: . acetaminophen  650 mg Oral TID  . allopurinol  100 mg Oral Daily  . brimonidine  1 drop Both Eyes Q12H   And  . timolol  1 drop Both Eyes BID  . brinzolamide  1 drop Both Eyes TID  . collagenase   Topical Daily  . feeding supplement (ENSURE)  1 Container Oral TID BM  . latanoprost  1 drop Both Eyes QHS  . levofloxacin  250 mg Oral Q24H  . levothyroxine  125 mcg Oral QAC breakfast  . methotrexate  10 mg Oral Weekly  . prednisoLONE acetate  1 drop Right Eye BID  . simvastatin  10 mg Oral q1800  . sodium chloride  3 mL Intravenous Q12H  . vitamin B-12  1,000 mcg Oral Daily   Continuous Infusions:  PRN Meds:.docusate sodium, morphine CONCENTRATE, nitroGLYCERIN, ondansetron (ZOFRAN) IV, ondansetron, RESOURCE THICKENUP CLEAR Assessment/Plan: Ms. Monter is a 80 year old female with PMH of HTN, hyperlipidemia, DM2, systolic CHF (30-35% on 09/26/13), CAD s/p MI and stent, atrial fibrillation, CVA Jan 215, Hypothyroidism on replacement, rheumatoid arthritis, and gout admitted for severe sepsis.   Sepsis 2/2 Ecoli UTI: Overnight BPs much more stable than previous 2 nights, mostly in the 100s-110s/40s-50s. Since she was able to maintain these pressures overnight without any fluid boluses in past two days, feel that her BP is stable. Pt continues to be afebrile since admission with WBCs trending down. She is on day 7 of 10 of Levofloxacin therapy. Since her BPs are stable, she is afebrile, and she is not tachycardic, there is no reason to keep her in the step down unit and she may be transitioned out. However, close attention must be paid to feeding her well so that she does not aspirate on foods.  -Continue to monitor BPs -Blood culture x2: Neg (final result) -Dressing changes BID per wound care consult -Levofloxacin 250 mg today and three more days. (day 7 of 10 of antibiotics). Dosing has been renally adjusted, pt's CrCl is 39.  Disposition: Palliative care had discussion with  patient's granddaughter (power of attorney) about patient's prognosis. Pt's granddaughter feels most comfortable with home-hospice given that patient's prognosis is not great at this point. Feel that patient should be treated for easily reversible pathologies such as infection, but would not give more aggressive therapies  such as anticoagulation and aggressive control of blood pressure. -Scheduled tylenol for pain -Hospice referral to Dr. Sudie Bailey. He will be her attending during hospice. She will not require PCP follow up beyond this. -Informed Care Manager to set up hospice referral  Hypertonic Hypernatremia-- Pt received D5W at 75cc/hr for 10 hrs and dropped from Na:150 --> 145 -->148. After fluids were stopped, pts Na returned back up to 150. Feel that this may be secondary to patient not taking enough PO water and patient's kidney not excreting as much sodium as it should 2/2 to her old age. Will restart D5w at 75 cc/hr until 6pm to bring down Na. Will retake BMET at 6pm to assess.  Recent Labs     10/06/13  0347  10/06/13  1039  10/06/13  1630  10/06/13  2100  10/07/13  0355  NA  148*  150*  145  148*  150*   -D5W at 75cc/hr until 6pm. BMET at 6pm to assess. Will stop trying to aggressively treat her Na via IV after this given granddaughter's desire to engage is mostly palliative/comfort care. Will assess whether pt's PO intake is now adequate to keep her Na levels where they should be. -BMETs daily  Atrial Fibrillation--remains in rate controlled Afib. On carvedilol 6.25 mg BID at home and no AC therapy (contra-indicated given intracranial hemorrhage s/p TPA for infarct 2 weeks prior). Was briefly in HR 130-140's yesterday evening but spontaneously resolved. HR 70's today.  -Holding carvedilol 6.25 mg BID in setting hypotension.  -Consulted Dr. Pearlean Brownie (neurologist): will NOT restart anti-coagulation given granddaughter's desire for comfort care. -Will NOT restart carvedilol after discharge  given her hypotensive episodes in the hospital.  Ischemic Stroke with hemorrhagic conversion--hemorrhagic conversion of a right lentiform nucleus infarct s/p IV tPA, infarct embolic secondary to atrial fibrillation during last hospitalization 1/6-1/12.  -will NOT restart anticoagulation given push to palliative care -Will not do CT scan on Friday as this will introduce unnecessary hospital costs and anticoagulation is no longer wanted. -Will cancel neurology appointment with dr. Pearlean Brownie in 2 months -Hospice will educate family about PT techniques at home  Systolic Congestive Failure--2D-echo on 09/16/13 with reduced EF of 30-35% and Akinesis of the entireanterior myocardium. LV diastolic could not be assessed. -Obtain daily weights and I&O's. Net -600 in past 24 hours  -Caution with IVF's. Concern for fluid overload yesterday because arms were puffy, but today patient looks dry.   Coronary Artery Disease - Pt with acute anterolateral myocardial infarction that occurred on 02/03/2012 with successful PTCA and stenting of the mid LAD with a non-drug-eluting 3.5 x 24 mm Veriflex stent. CE trended down  -Hold home pravastatin. Consider whether necessary at discharge.  -Nitroglycerin as needed for CP  -D/c tele-monitoring  Anion Gap - improving. Pt with AG of 20 on admission most likely due to lactic acidosis due to sepsis, now 13  -Continue to monitor   Hypoalbuminemia and severe malnutrition - Pt with severe malnutrition in the context of chronic illness and albumin of 1.9.  -Appreciate nutrition following  -Ensure pudding PO TID, dys 1 diet for now    DM2--CBG's elevated in setting of D5W. HbA1c on 6.5 on 09/16/13. Pt on metformin 500 mg BID at home  -Hold home metformin 500 mg BID in case IV contrast study is desired. -Monitor glucose frequently  -SSI   Hypothyroidism  -Continue levothyroxine 125 mcg daily. May consider holding this at discharge. -TSH 0.664   Rheumatoid Arthritis--on  methotrexate at home  10mg  q weekly  -Will resume methotrexate (q saturdays). Okay to give MTX per pharmacy as long as patient is not actively septic.  -May consider stopping MTX at discharge.  Gout - currently stable  -Continue allopurinol 100 mg daily  -May consider whether required at discharge  Acute Kidney Injury--resolved. Cr 1.17 on admission and down to 0.71 today.     LOS: 6 days   This is a Note.  The care of the patient was discussed with Dr. Psychologist, occupational and the assessment and plan formulated with their assistance.  Please see their attached note for official documentation of the daily encounter.  [name] 10/07/2013, 2:53 PM

## 2013-10-07 NOTE — Progress Notes (Addendum)
Subjective:  Pt seen and examined in AM.  No acute events overnight. Pt much more alert and conversant today. Pt denies fever, chills, weakness, nausea, vomiting, abdominal pain, or change in BM or urination. She is requesting water.    Objective: Vital signs in last 24 hours: Filed Vitals:   10/07/13 0000 10/07/13 0429 10/07/13 0758 10/07/13 1200  BP: 87/41 115/47 133/61 131/49  Pulse: 62 60 76 73  Temp: 97.7 F (36.5 C) 98.5 F (36.9 C) 97.6 F (36.4 C) 97.4 F (36.3 C)  TempSrc: Oral Oral Axillary Axillary  Resp: 20  25 24   Height:      Weight:  60.1 kg (132 lb 7.9 oz)    SpO2: 100% 100% 99% 100%   Weight change: -0.7 kg (-1 lb 8.7 oz)  Intake/Output Summary (Last 24 hours) at 10/07/13 1529 Last data filed at 10/07/13 1300  Gross per 24 hour  Intake 1572.5 ml  Output    250 ml  Net 1322.5 ml    Constitutional: She is oriented to person, place, and time. No distress.  Cachetic appearing  Head: Normocephalic and atraumatic.  Nose: Nose normal.  Mouth/Throat: Oropharynx is clear and moist. No oropharyngeal exudate.  Eyes:  EOM are normal Neck: Normal range of motion. Neck supple.  Cardiovascular: Normal rate. Irregular rhythm  Pulmonary/Chest: Effort normal and breath sounds normal. Anterior breath fields clear  Abdominal: Soft. Bowel sounds are normal. She exhibits no distension. There is no tenderness. There is no rebound and no guarding.  Musculoskeletal: mild b/l MCP swelling with no overlying warmth, erythema, or tenderness to palpation; SCDs on bilateral legs and boots on feet Neurological: She is alert and oriented to person, place, and time. No cranial nerve deficit.  Slow to respond to questions, no slurred speech. Left UE and LE 3/5 weakness, otherwise 4/5 throughout; No sensation of left UE or LE.  Skin: Skin is warm and dry. She is not diaphoretic.  Sacral wound ulcer covered by dressing     Lab Results: Basic Metabolic Panel:  Recent Labs Lab  10/03/13 0400  10/06/13 2100 10/07/13 0355 10/07/13 1000  NA 154*  < > 148* 150*  --   K 3.4*  < > 3.4* 3.5*  --   CL 117*  < > 112 115*  --   CO2 21  < > 24 23  --   GLUCOSE 313*  < > 136* 177*  --   BUN 33*  < > 14 13  --   CREATININE 0.78  < > 0.67 0.66  --   CALCIUM 8.9  < > 8.7 8.7  --   MG 1.8  --   --   --  1.5  < > = values in this interval not displayed. Liver Function Tests:  Recent Labs Lab 10/01/13 1030 10/02/13 0025  AST 19 20  ALT 11 10  ALKPHOS 84 67  BILITOT 0.7 0.7  PROT 6.3 5.1*  ALBUMIN 2.4* 1.9*   CBC:  Recent Labs Lab 10/01/13 1030  10/03/13 0400 10/05/13 0247 10/07/13 0355  WBC 16.0*  < > 13.5* 11.7* 10.1  NEUTROABS 13.6*  --  11.6*  --   --   HGB 12.4  < > 9.8* 9.2* 9.4*  HCT 37.1  < > 29.6* 28.5* 28.5*  MCV 96.9  < > 94.6 95.3 94.4  PLT 196  < > 123* 106* 104*  < > = values in this interval not displayed. Cardiac Enzymes:  Recent  Labs Lab 10/01/13 1810 10/02/13 0025 10/02/13 0434  TROPONINI 0.73* 0.53* 0.46*   CBG:  Recent Labs Lab 10/05/13 2102 10/06/13 0802 10/06/13 1226 10/06/13 1735 10/06/13 2021 10/07/13 0814  GLUCAP 216* 195* 272* 162* 121* 164*   Thyroid Function Tests:  Recent Labs Lab 10/01/13 1810  TSH 0.664   Coagulation:  Recent Labs Lab 10/02/13 0025  LABPROT 18.2*  INR 1.55*   Urine Drug Screen: Drugs of Abuse     Component Value Date/Time   LABOPIA NONE DETECTED 09/15/2013 1709   COCAINSCRNUR NONE DETECTED 09/15/2013 1709   LABBENZ NONE DETECTED 09/15/2013 1709   AMPHETMU NONE DETECTED 09/15/2013 1709   THCU NONE DETECTED 09/15/2013 1709   LABBARB NONE DETECTED 09/15/2013 1709    Urinalysis:  Recent Labs Lab 10/01/13 1127  COLORURINE ORANGE*  LABSPEC 1.024  PHURINE 5.0  GLUCOSEU NEGATIVE  HGBUR SMALL*  BILIRUBINUR MODERATE*  KETONESUR 15*  PROTEINUR 30*  UROBILINOGEN 1.0  NITRITE NEGATIVE  LEUKOCYTESUR MODERATE*   Micro Results: Recent Results (from the past 240 hour(s))  CULTURE,  BLOOD (ROUTINE X 2)     Status: None   Collection Time    10/01/13 10:30 AM      Result Value Range Status   Specimen Description BLOOD RIGHT FOREARM   Final   Special Requests BOTTLES DRAWN AEROBIC AND ANAEROBIC 5CCS   Final   Culture  Setup Time     Final   Value: 10/01/2013 18:19     Performed at [facility]   Culture     Final   Value: NO GROWTH 5 DAYS     Performed at [facility]   Report Status 10/07/2013 FINAL   Final  CULTURE, BLOOD (ROUTINE X 2)     Status: None   Collection Time    10/01/13 11:10 AM      Result Value Range Status   Specimen Description BLOOD LEFT ANTECUBITAL   Final   Special Requests BOTTLES DRAWN AEROBIC AND ANAEROBIC 5CC   Final   Culture  Setup Time     Final   Value: 10/01/2013 18:19     Performed at [facility]   Culture     Final   Value: NO GROWTH 5 DAYS     Performed at [facility]   Report Status 10/07/2013 FINAL   Final  URINE CULTURE     Status: None   Collection Time    10/01/13 11:27 AM      Result Value Range Status   Specimen Description URINE, CATHETERIZED   Final   Special Requests NONE   Final   Culture  Setup Time     Final   Value: 10/01/2013 18:21     Performed at [facility] Count     Final   Value: >=100,000 COLONIES/ML     Performed at [facility]   Culture     Final   Value: ESCHERICHIA COLI     Performed at [facility]   Report Status 10/03/2013 FINAL   Final   Organism ID, Bacteria ESCHERICHIA COLI   Final   Medications: I have reviewed the patient's current medications. Scheduled Meds: . acetaminophen  650 mg Oral TID  . allopurinol  100 mg Oral Daily  . brimonidine  1 drop Both Eyes Q12H   And  . timolol  1 drop Both Eyes BID  . brinzolamide  1 drop Both Eyes TID  . collagenase  Topical Daily  . feeding supplement (ENSURE)  1 Container Oral TID BM  . latanoprost  1 drop Both Eyes QHS  . levofloxacin  250 mg Oral Q24H  . levothyroxine   125 mcg Oral QAC breakfast  . methotrexate  10 mg Oral Weekly  . prednisoLONE acetate  1 drop Right Eye BID  . simvastatin  10 mg Oral q1800  . sodium chloride  3 mL Intravenous Q12H  . vitamin B-12  1,000 mcg Oral Daily   Continuous Infusions:   PRN Meds:.docusate sodium, morphine CONCENTRATE, nitroGLYCERIN, ondansetron (ZOFRAN) IV, ondansetron, RESOURCE THICKENUP CLEAR  Assessment: 80 year old woman with past medical history of hypertension, hyperlipidemia, non-insulin Type 2 DM, systolic CHF (30-35% on 09/26/13), CAD s/p MI and stent, atrial fibrillation, CVA Jan 215, Hypothyroidism on replacement, rheumatoid arthritis, and gout who presented on  1/22 with altered mental status per family and found to be in sepsis.   Plan:   Sepsis most likely due to E. Coli UTI - improving.  Leukocytosis improved, however still tachypneic and with low-normal blood pressure.  Pt presented with SIRS+4 (T 100.9, HR 122, RR 28, WBC 16K with neutrophilia) and found to be hypotensive with blood pressure of 80/33 requiring fluid resuscitation. Lactic acid was elevated at 2.9 on admission which normalized. Source of infection unclear but most likely due to E. Coli UTI vs unstagable sacral pressure ulcer. Pt was found to be influenza negative with no acute cardiopulmonary disease on chest xray.  - NSL IV and avoid re-stick or restart due to palliative status  -Obtain blood cultures x 2 --> NGTD  -Appreciate wound care consult, will need palliative wound care -Total 7/10 days of antibiotics,  Levaquin 250 mg  for pansensitive  E. Coli UTI   -Leukocytosis resolving (16K to 11.7) -Palliative care consult --> family decides hospice care instead of home health and SNF -Morphine 5 mg Q2hr PRN pain, anxiety, dyspnea -Tylenol 650 mg TID pain  -Comfort feeding, continue, limit blood draws, uninterrupted rest   Hypovolemic Hypernatremia - Na 150. Serum osmolarity of 336, urine osmolality of 689 with sodium on admission  of 155, now 150. Most likely due to hypovolemia in setting of hypotension.  -NSL IV and avoid re-stick or restart due to palliative status  -Encourage oral hydration    Elevated Troponin - Pt with elevated troponin of 0.73 to 0.53 to 0.46 most likely due to demand ischemia in setting of sepsis. Pt also with history of MI s/p stent on 02/03/12.  -Supportive care   Unstageable Sacral Pressure Ulcer - Pt with unstagable pressure wound ulcer without subcutaneous fat/ tendon/bone penetration of 1 week duration due to bed bound status after ischemic stroke prior this month.  -Appreciate wound care consult -Continue air mattress, prevalon boots, enzymatic debridement ointment and moist gauze   Hypoalbuminemia and severe malnutrition - Pt with severe malnutrition in the context of chronic illness and albumin of 1.9.  -Appreciate nutrition consult --> dysphagia 1  -Ensure pudding PO TID   Atrial Fibrillation - currently in atrial fibrillation with normal HR on carvedilol 6.25 mg BID for rate control and no AC therapy due to recent hemorrhagic stroke -Hold carvedilol 6.25 mg BID in setting of hypotension -Continue b/l SCD's -D/C telemetry monitoring in setting of palliative status  -Appreciate neurology recommendations -->Per Dr. Pearlean Brownie, [facility] no longer warranted in setting of patient's palliative status   Systolic Congestive Failure - Last 2D-echo on 09/16/13 with reduced EF of 30-35% and akinesis of the entire anterior  myocardium. LV diastolic could not be assessed. -Obtain daily weight (134 to 132lb) and I &O's (300 mL urine output yesterday)  -Caution with IVF's   Ischemic Stroke with hemorrhagic conversion - Pt with hemorrhagic conversion of a right lentiform nucleus infarct s/p IV tPA, infarct embolic secondary to atrial fibrillation during last hospitalization 1/6-1/12.  CT Head w/o contrast with expected evolution of intracranial hemorrhage and not isodense, so no AP or AC therapy was  initiated -Appreciate neurology recommendations -->Per Dr. Pearlean Brownie, [facility] no longer warranted in setting of patient's palliative status   Coronary Artery Disease - Pt with acute anterolateral myocardial infarction that occurred on 02/03/2012 with successful PTCA and stenting of the mid LAD with a non-drug-eluting 3.5 x 24 mm Veriflex stent.  -Simvastatin 10 mg daily   -Nitroglycerin as needed for CP   Non-insulin-Type II Diabetes Mellitus - CBG 272. HbA1c on 6.5 on 09/16/13. Pt on metformin 500 mg BID at home.  -Hold home metformin 500 mg BID during hospitalization  -Monitor glucose frequently  -Continue moderate insulin sliding scale -If uncontrolled consider adding Lantus    Hypertension - Currently normotensive  -Hold home rampiril 1.25 mg daily in setting of hypotension and on discharge -Hold carvedilol 6.25 mg BID in setting of hypotension and on discharge  Hypothyroidism - Last TSH 1.327 on 02/03/12.  -Continue levothyroxine 125 mcg daily  -Obtain TSH --> wnl   Rheumatoid Arthritis -currently with mild b/l MCP swelling without joint pain, warmth, or erythema  -Hold home methotrexate 10 mg weekly in setting of infection, resume on Saturday  Gout - currently stable without flare  -Continue allopurinol 100 mg daily   Chronic Thrombocytopenia -  Pt with no active bleeding. Platelet count on admission of 196K with baseline 100-110K. -Continue to monitor  -Hold AP and AC therapy   Anion Gap - Resolved. Pt with AG of 20 on admission most likely due to lactic acidosis due to sepsis, now closed to 12 most likely due to elevated lactic acid due to sepsis.     -Continue to monitor  Hypokalemia -  Resolved. Pt with mild potassium of 3.4 most likely due to decreased PO intake. s hypomagnesemia  -Supplement with KCl as needed -Continue to monitor   Acute Kidney Injury - Resolved. Pt with last Cr was 0.78 on 1/10, on admission 1.17, now 0.72. Most likely due to pre-renal azotemia (hypolemia) as  resolved with administration of IVF's and FeNa (0.2%) <1%  -Hold home ramipril  -Obtain daily weight (134 to 132lb) and I &O's (300 mL urine output yesterday)  -Maintain foley catheter    Diet: Dysphagia 1  DVT PPx: SCDs  Code: DNR/DNI    Dispo: tomorrow awaiting home hospice  The patient does have a current PCP Milana Obey, MD) and does need an [facility] hospital follow-up appointment after discharge.  The patient does not have transportation limitations that hinder transportation to clinic appointments.  Services Needed at time of discharge: Y = Yes, Blank = No PT: SNF  OT:   RN:   Equipment:   Other:     LOS: 6 days   Otis Brace, MD 10/07/2013, 3:29 PM

## 2013-10-07 NOTE — Consult Note (Addendum)
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 She was being cared for at home following a major stroke which occurred 09/15/2013. She had history of A. Fib on apixiban but stopped taking her medicine on 09/03/13. She was given TPA and had subsequent hemorrhagic transformation of a large right MCA territory stroke. She was being cared for by her grand-daughter and severl other family members, but they felt like HH services was not enough help and assistance for what they needed-especially in advising about her wound and oral intake. Her niece has been considering option of SNF. PMT asked to consult for goals of care and to discuss other possible options for her care and to support this family.  Participants in Discussion: Adrian Sarannita Winchester, grandaughter/POA  Goals/Summary of Discussion:  1. Code Status:  DNR, confirmed.  2. Scope of Treatment:  Primary Goals are comfort and QOL   To have symptoms ie. Pain, wounds, dyspnea adequately managed- but not be overly sedated  Comfort feeding-allow for small sips of thins -even if from a swab, aspiration precautions in place-ie HOB, small amounts, careful hand feeding  Treat reversible problems ie. Infections w/antibiotics -as long as it doesn't require painful interventions  Avoid re-hospitalization  Do not resume anticoagulation, risk outweight benefits, no need for additional imaging-will confer very little given her overall big picture/room for improvement-would not enhance comfort or QOL and she already has profound stroke  deficits. Other contraindications to anti-coagulation are malnutrition, already has low platelets.  Leave Foley in at discharge for comfort and to keep her dry with decubitus   3. Assessment/Plan:  Primary Diagnoses  1. Large R-MCA stroke 2. Dysphagia 3. Left Neglect, hemiparesis 4. A. Fib 5. Hypotension 6. Sacral decubitus, heel pressure 7. Malnutrition 8. Totally bed bound/Immobility  Prognosis: <3 months, based on PPS and multiple co-morbitities    PPS 30%   Active Symptoms 1. Pain 2. Immobility 3. Dysphagia  Recommendations:  Scheduled Tylenol for Pain  Low dose SL Roxanol 2.5-5mg  q2 prn pain, distress  Hospice Referral (see dispo below)  D/C tele, move to med surg, allow for uninterrupted rest  Palliative wound care  Comfort feeding- sips of thins ok for comfort if desired  Leave Foley in place at d/c  NSL IV and avoid re-stick or restart  No need to check her sugars qid  4. Palliative Prophylaxis:   Bowel Regimen- yes  Terminal Secretions-NA  Breakthrough Pain and Dyspnea -YES  Agitation and Delirium-YES  Nausea-YES  5. Psychosocial Spiritual Asssessment/Interventions:  Patient and Family Adjustment to Illness/Prognosis:  Coping very well. Reasonable, thoughtful and loving family at her bedside.  Spiritual Concerns or Needs: none expressed, she is the oldest living member of her church community.  6. Disposition: After a long and in depth discussion about Ms. [name]'s wishes and what her options were for living well with whatever length of life she has left her granddaughter feels strongly that she would like to take her home and thinks that she can be successful with the assistance of hospice instead of home health- there is someone  home 24/7-usually her daughter, she also has a grandson who helps out and her granddaughter. She has specific requests for DME- matress overlay and the heel protectors. Will place CM order for Home [facility]osp[facility].  ROS:  Unable to obtain. Social History:   reports that she has never smoked. She does not have any smokeless tobacco history on file. She reports that she does not drink alcohol. Living Situation: with family Occupation: retired, school custodian  Family History: History reviewed. No pertinent family history.  Active Medications:  Outpatient medications: Prescriptions prior to admission  Medication Sig Dispense Refill  . allopurinol (ZYLOPRIM) 100 MG tablet Take 100 mg by mouth daily.      . bimatoprost (LUMIGAN) 0.03 % ophthalmic solution Place 1 drop into both eyes at bedtime.      . brimonidine-timolol (COMBIGAN) 0.2-0.5 % ophthalmic solution Place 1 drop into both eyes every 12 (twelve) hours.      . brinzolamide (AZOPT) 1 % ophthalmic suspension Place 1 drop into both eyes 3 (three) times daily.      . carvedilol (COREG) 6.25 MG tablet Take 6.25 mg by mouth 2 (two) times daily with a meal.      . docusate sodium (COLACE) 100 MG capsule Take 100 mg by mouth daily as needed for mild constipation.      Marland Kitchen levothyroxine (SYNTHROID, LEVOTHROID) 125 MCG tablet Take 125 mcg by mouth daily.      . methotrexate (RHEUMATREX) 2.5 MG tablet Take 10 mg by mouth once a week. Caution:Chemotherapy. Protect from light. On Saturdays      . nitroGLYCERIN (NITROSTAT) 0.4 MG SL tablet Place 0.4 mg under the tongue every 5 (five) minutes as needed for chest pain.      Marland Kitchen Psyllium (METAMUCIL PO) Take 1 packet by mouth daily as needed (constipation).      . Maltodextrin-Xanthan Gum (RESOURCE THICKENUP CLEAR) POWD Take 120 g by mouth as needed (thicken liquids to nectar thick consistency).  1 Can  12  . metFORMIN (GLUCOPHAGE) 500 MG tablet Take 500 mg by mouth 2 (two) times daily with a meal.       . pravastatin (PRAVACHOL) 20 MG tablet Take 20 mg by mouth every evening.      . prednisoLONE acetate (PRED FORTE) 1 % ophthalmic suspension Place 1 drop into the right eye 2 (two) times daily.      .  ramipril (ALTACE) 1.25 MG capsule Take 1.25 mg by mouth daily.      . vitamin B-12 (CYANOCOBALAMIN) 1000 MCG tablet Take 1,000 mcg by mouth daily.        Current medications: Infusions: . dextrose 50 mL/hr at 10/07/13 0800    Scheduled Medications: . allopurinol  100 mg Oral Daily  . brimonidine  1 drop Both Eyes Q12H   And  . timolol  1 drop Both Eyes BID  . brinzolamide  1 drop Both Eyes TID  . collagenase   Topical Daily  . feeding supplement (ENSURE)  1 Container Oral TID BM  . insulin aspart  0-15 Units Subcutaneous TID WC  . latanoprost  1 drop Both Eyes QHS  . levofloxacin  250 mg Oral Q24H  . levothyroxine  125 mcg Oral QAC breakfast  . methotrexate  10 mg Oral Weekly  . prednisoLONE acetate  1 drop Right Eye BID  . simvastatin  10 mg Oral q1800  . sodium chloride  3 mL Intravenous Q12H  . vitamin B-12  1,000 mcg Oral Daily  PRN Medications: acetaminophen, acetaminophen, docusate sodium, nitroGLYCERIN, ondansetron (ZOFRAN) IV, ondansetron, RESOURCE THICKENUP CLEAR   Vital Signs: BP 133/61  Pulse 76  Temp(Src) 97.6 F (36.4 C) (Axillary)  Resp 25  Ht 5' 4.17" (1.63 m)  Wt 60.1 kg (132 lb 7.9 oz)  BMI 22.62 kg/m2  SpO2 99%   Physical Exam:  Frail, awake, alert, answers questions very slowly but suprisingly accurately. Denies pain but shows grimace, wincing and moans with minimal movement. +edema extremities, left neglect and hemiparesis. IRIR, deminished breath sounds in bases. Very dry mouth. Not agitated.  Labs:  Basic or Comprehensive Metabolic Panel:    Component Value Date/Time   NA 150* 10/07/2013 0355   K 3.5* 10/07/2013 0355   CL 115* 10/07/2013 0355   CO2 23 10/07/2013 0355   BUN 13 10/07/2013 0355   CREATININE 0.66 10/07/2013 0355   GLUCOSE 177* 10/07/2013 0355   CALCIUM 8.7 10/07/2013 0355   AST 20 10/02/2013 0025   ALT 10 10/02/2013 0025   ALKPHOS 67 10/02/2013 0025   BILITOT 0.7 10/02/2013 0025   PROT 5.1* 10/02/2013 0025   ALBUMIN 1.9* 10/02/2013  0025     CBC:    Component Value Date/Time   WBC 10.1 10/07/2013 0355   HGB 9.4* 10/07/2013 0355   HCT 28.5* 10/07/2013 0355   PLT 104* 10/07/2013 0355   MCV 94.4 10/07/2013 0355   NEUTROABS 11.6* 10/03/2013 0400   LYMPHSABS 1.1 10/03/2013 0400   MONOABS 0.6 10/03/2013 0400   EOSABS 0.1 10/03/2013 0400   BASOSABS 0.0 10/03/2013 0400     BNP (last 3 results) No results found for this basename: PROBNP,  in the last 8760 hours  CBG (last 3)   Recent Labs  10/06/13 1735 10/06/13 2021 10/07/13 0814  GLUCAP 162* 121* 164*    Imaging:  No results found.  Other Data:  (2D echo, EKG...)   Educational Materials Given:  DNR: Yes  MOST: No Healthcare Power-of-Attorney: Yes    Time:  120 minutes. 10AM-12:06PM including documentation and bedside care time. Greater than 50%  of this time was spent counseling and coordinating care related to the above assessment and plan.  Signed by: Edsel Petrin, DO  10/07/2013, 10:55 AM  Please contact Palliative Medicine Team phone at [phone] for questions and concerns.

## 2013-10-07 NOTE — Progress Notes (Signed)
CSW received voicemail from pt's granddaughter [name] that she is selecting [employer]support[facility]administrative officer for SNF placement. CSW called [facility] back, and [name] explains she toured some facilities yesterday and this was the first choice among the options she toured--she will tour a few others and might have another facility in mind after seeing the other options. CSW explained that [facility] can be Plan B if she finds a different facility that would be more appealing, but for now the plan is discharge to [facility].    Maryclare Labrador, MSW, [facility] Clinical Social Worker [phone]

## 2013-10-07 NOTE — Progress Notes (Signed)
I have read and agree with MS3 Manasi Tannu's note. Please see my progress note as well.  

## 2013-10-07 NOTE — Progress Notes (Signed)
Internal Medicine Attending  Date: 10/07/2013  Patient name: [name] Medical record number: [id number] Date of birth: April 23, 1921 Age: 80 y.o. Gender: female  I saw and evaluated the patient, and discussed her care on A.M rounds with housestaff.  She is alert, answers questions, denies pain or shortness of breath.  She is afebrile with stable vital signs.  Exam shows clear lungs; regular rhythm; soft nontender abdomen; no peripheral edema.  Labs are notable for sodium of 150, potassium 3.5, creatinine 0.66.  Plans include complete course of oral levofloxacin for Escherichia coli UTI; encourage free water intake with thickened water and give additional D5W today; wound care.  Palliative care consultation was obtained, and decision made for home hospice care when patient is discharged.

## 2013-10-08 MED ORDER — LEVOFLOXACIN 250 MG PO TABS: 250.0000 mg | ORAL_TABLET | ORAL | Status: AC

## 2013-10-08 MED ORDER — MORPHINE SULFATE (CONCENTRATE) 10 MG /0.5 ML PO SOLN: 5.0000 mg | ORAL | Status: AC | PRN

## 2013-10-08 NOTE — Progress Notes (Signed)
SLP Cancellation Note  Patient Details Name: [name] MRN: [id number] DOB: November 19, 1920   Cancelled treatment:       Reason Eval/Treat Not Completed: Fatigue/lethargy limiting ability to participate. Pt sleeping. Called [name] to f/u for any questions or concerns. She verbalizes that she will plan to give teaspoon sips of water to [name], but otherwise provide honey thick liquids. She reports that she understood from [facility] SLP that pt would not aspirate water via teaspoon. This SLP informed her that based on MBS finding, pt will likely still silently aspirate water, but if given in small quantities after oral care the risk of infection is reduced. She still verbalizes that she will proceed with comfort feeding, but will wait to do it at home and would prefer the hospital staff not start giving pt any water here. Will discuss with RN. No further SLP needs at this time.   Harlon Ditty, MA CCC-SLP [phone]  Claudine Mouton 10/08/2013, 11:59 AM

## 2013-10-08 NOTE — Consult Note (Signed)
WOC wound follow up Wound type: sacral unstagable pressure ulcer, left and right sDTIs, midback sDTI Measurement: did not measure today Wound UXY:BFXOV are still intact, midback wound is intact blister, sacrum 50% softening yellow black slough with 50% moist pink tissue centrally.  Drainage (amount, consistency, odor) moderate from the sacrum, all other sites intact currently with no drainge Periwound:intact Dressing procedure/placement/frequency: Will dc Santyl at this time since going home with Hospice care would recommend foam dressings to the midback and sacral wounds to protect and insulate. The sacrum may evolve into deeper wound but with the goals of care for comfort I will lessen the burden on a caregiver with a dressing than can manage the exudate and maintain the wound at its current status possibly.  Will need air mattress at home for offloading and comfort if possible.  Prevalon boots to go home with patient to protect and offload heel pressure ulcers to protect from further breakdown and any extension of her current wounds.     Discussed POC with patient and bedside nurse.  Re consult if needed, will not follow at this time. Thanks  Caden Fatica [employer], CWOCN [phone])

## 2013-10-08 NOTE — Progress Notes (Signed)
Pt is sitting up in chair. Pt is being discharged to home per md orders and pt is going home on hospice. Social work spoke with pt's granddaughter and she is suppose to be at house when pt gets there. [facility] ambulance has been called by social work. Social work called hospice and they are going to be at house when pt arrives. Taking iv out now and foley has been left in per md orders for end of life care. Pt is confused.

## 2013-10-08 NOTE — Progress Notes (Signed)
Subjective:  Pt seen and examined in AM.  No acute events overnight. Pt much more alert and conversant today. Pt denies fever, chills, weakness, nausea, vomiting, abdominal pain, or change in BM or urination.    Objective: Vital signs in last 24 hours: Filed Vitals:   10/08/13 0830 10/08/13 0840 10/08/13 0850 10/08/13 1130  BP:    141/49  Pulse: 64 77 66 68  Temp:    97.5 F (36.4 C)  TempSrc:    Axillary  Resp: 20 20 21 18   Height:      Weight:      SpO2: 100% 100% 99% 100%   Weight change: 0.7 kg (1 lb 8.7 oz)  Intake/Output Summary (Last 24 hours) at 10/08/13 1155 Last data filed at 10/08/13 1130  Gross per 24 hour  Intake    213 ml  Output    700 ml  Net   -487 ml    Constitutional: She is oriented to person, place, and time. No distress.  Cachetic appearing  Head: Normocephalic and atraumatic.  Nose: Nose normal.  Mouth/Throat: Oropharynx is clear and moist. No oropharyngeal exudate.  Eyes:  EOM are normal Neck: Normal range of motion. Neck supple.  Cardiovascular: Normal rate. Irregular rhythm  Pulmonary/Chest: Effort normal and breath sounds normal. Anterior breath fields clear  Abdominal: Soft. Bowel sounds are normal. She exhibits no distension. There is no tenderness. There is no rebound and no guarding.  Musculoskeletal: mild b/l MCP swelling with no overlying warmth, erythema, or tenderness to palpation; SCDs on bilateral legs and boots on feet Neurological: She is alert and oriented to person, place, and time. No cranial nerve deficit.  Slow to respond to questions, no slurred speech. Left UE and LE 3/5 weakness, otherwise 4/5 throughout; No sensation of left UE or LE.  Skin: Skin is warm and dry. She is not diaphoretic.  Sacral wound ulcer covered by dressing     Lab Results: Basic Metabolic Panel:  Recent Labs Lab 10/03/13 0400  10/06/13 2100 10/07/13 0355 10/07/13 1000  NA 154*  < > 148* 150*  --   K 3.4*  < > 3.4* 3.5*  --   CL 117*  < >  112 115*  --   CO2 21  < > 24 23  --   GLUCOSE 313*  < > 136* 177*  --   BUN 33*  < > 14 13  --   CREATININE 0.78  < > 0.67 0.66  --   CALCIUM 8.9  < > 8.7 8.7  --   MG 1.8  --   --   --  1.5  < > = values in this interval not displayed. Liver Function Tests:  Recent Labs Lab 10/02/13 0025  AST 20  ALT 10  ALKPHOS 67  BILITOT 0.7  PROT 5.1*  ALBUMIN 1.9*   CBC:  Recent Labs Lab 10/03/13 0400 10/05/13 0247 10/07/13 0355  WBC 13.5* 11.7* 10.1  NEUTROABS 11.6*  --   --   HGB 9.8* 9.2* 9.4*  HCT 29.6* 28.5* 28.5*  MCV 94.6 95.3 94.4  PLT 123* 106* 104*   Cardiac Enzymes:  Recent Labs Lab 10/01/13 1810 10/02/13 0025 10/02/13 0434  TROPONINI 0.73* 0.53* 0.46*   CBG:  Recent Labs Lab 10/06/13 0802 10/06/13 1226 10/06/13 1735 10/06/13 2021 10/07/13 0814 10/07/13 1204  GLUCAP 195* 272* 162* 121* 164* 141*   Thyroid Function Tests:  Recent Labs Lab 10/01/13 1810  TSH 0.664  Coagulation:  Recent Labs Lab 10/02/13 0025  LABPROT 18.2*  INR 1.55*   Urine Drug Screen: Drugs of Abuse     Component Value Date/Time   LABOPIA NONE DETECTED 09/15/2013 1709   COCAINSCRNUR NONE DETECTED 09/15/2013 1709   LABBENZ NONE DETECTED 09/15/2013 1709   AMPHETMU NONE DETECTED 09/15/2013 1709   THCU NONE DETECTED 09/15/2013 1709   LABBARB NONE DETECTED 09/15/2013 1709    Urinalysis: No results found for this basename: COLORURINE, APPERANCEUR, LABSPEC, PHURINE, GLUCOSEU, HGBUR, BILIRUBINUR, KETONESUR, PROTEINUR, UROBILINOGEN, NITRITE, LEUKOCYTESUR,  in the last 168 hours Micro Results: Recent Results (from the past 240 hour(s))  CULTURE, BLOOD (ROUTINE X 2)     Status: None   Collection Time    10/01/13 10:30 AM      Result Value Range Status   Specimen Description BLOOD RIGHT FOREARM   Final   Special Requests BOTTLES DRAWN AEROBIC AND ANAEROBIC 5CCS   Final   Culture  Setup Time     Final   Value: 10/01/2013 18:19     Performed at [facility]   Culture      Final   Value: NO GROWTH 5 DAYS     Performed at [facility]   Report Status 10/07/2013 FINAL   Final  CULTURE, BLOOD (ROUTINE X 2)     Status: None   Collection Time    10/01/13 11:10 AM      Result Value Range Status   Specimen Description BLOOD LEFT ANTECUBITAL   Final   Special Requests BOTTLES DRAWN AEROBIC AND ANAEROBIC 5CC   Final   Culture  Setup Time     Final   Value: 10/01/2013 18:19     Performed at [facility]   Culture     Final   Value: NO GROWTH 5 DAYS     Performed at [facility]   Report Status 10/07/2013 FINAL   Final  URINE CULTURE     Status: None   Collection Time    10/01/13 11:27 AM      Result Value Range Status   Specimen Description URINE, CATHETERIZED   Final   Special Requests NONE   Final   Culture  Setup Time     Final   Value: 10/01/2013 18:21     Performed at [facility] Count     Final   Value: >=100,000 COLONIES/ML     Performed at [facility]   Culture     Final   Value: ESCHERICHIA COLI     Performed at [facility]   Report Status 10/03/2013 FINAL   Final   Organism ID, Bacteria ESCHERICHIA COLI   Final   Medications: I have reviewed the patient's current medications. Scheduled Meds: . acetaminophen  650 mg Oral TID  . allopurinol  100 mg Oral Daily  . brimonidine  1 drop Both Eyes Q12H   And  . timolol  1 drop Both Eyes BID  . brinzolamide  1 drop Both Eyes TID  . collagenase   Topical Daily  . feeding supplement (ENSURE)  1 Container Oral TID BM  . latanoprost  1 drop Both Eyes QHS  . levofloxacin  250 mg Oral Q24H  . levothyroxine  125 mcg Oral QAC breakfast  . methotrexate  10 mg Oral Weekly  . prednisoLONE acetate  1 drop Right Eye BID  . simvastatin  10 mg Oral q1800  . sodium chloride  3 mL  Intravenous Q12H  . vitamin B-12  1,000 mcg Oral Daily   Continuous Infusions:   PRN Meds:.docusate sodium, morphine CONCENTRATE, nitroGLYCERIN, ondansetron (ZOFRAN)  IV, ondansetron, RESOURCE THICKENUP CLEAR  Assessment: 80 year old woman with past medical history of hypertension, hyperlipidemia, non-insulin Type 2 DM, systolic CHF (30-35% on 09/26/13), CAD s/p MI and stent, atrial fibrillation, CVA Jan 215, Hypothyroidism on replacement, rheumatoid arthritis, and gout who presented on  1/22 with altered mental status per family and found to be in sepsis.   Plan:   Sepsis most likely due to E. Coli UTI - improving.  Leukocytosis improved, however still tachypneic and with low-normal blood pressure.  Pt presented with SIRS+4 (T 100.9, HR 122, RR 28, WBC 16K with neutrophilia) and found to be hypotensive with blood pressure of 80/33 requiring fluid resuscitation. Lactic acid was elevated at 2.9 on admission which normalized. Source of infection unclear but most likely due to E. Coli UTI vs unstagable sacral pressure ulcer. Pt was found to be influenza negative with no acute cardiopulmonary disease on chest xray.  - NSL IV and avoid re-stick or restart due to palliative status  -Obtain blood cultures x 2 --> NGTD  -Appreciate wound care consult, will need palliative wound care -Total 8/10 day of antibiotics,  Levaquin 250 mg  for pansensitive  E. Coli UTI   -Leukocytosis resolving (16K to 11.7) -Palliative care consult --> family decides hospice care instead of home health and SNF -Morphine 5 mg Q2hr PRN pain, anxiety, dyspnea -Tylenol 650 mg TID pain  -Comfort feeding, continue, limit blood draws, uninterrupted rest -Discharge to home hospice   Hypovolemic Hypernatremia - Na 150 on 1/28. Serum osmolarity of 336, urine osmolality of 689 with sodium on admission of 155, now 150. Most likely due to hypovolemia in setting of hypotension.  -NSL IV and avoid re-stick or restart due to palliative status  -Encourage oral hydration    Elevated Troponin - Pt with elevated troponin of 0.73 to 0.53 to 0.46 most likely due to demand ischemia in setting of sepsis. Pt also  with history of MI s/p stent on 02/03/12.  -Supportive care   Unstageable Sacral Pressure Ulcer - Pt with unstagable pressure wound ulcer without subcutaneous fat/ tendon/bone penetration of 1 week duration due to bed bound status after ischemic stroke prior this month.  -Appreciate wound care consult -Continue air mattress, prevalon boots, enzymatic debridement ointment and moist gauze   Hypoalbuminemia and severe malnutrition - Pt with severe malnutrition in the context of chronic illness and albumin of 1.9.  -Appreciate nutrition consult --> dysphagia 1  -Ensure pudding PO TID   Atrial Fibrillation - currently in atrial fibrillation with normal HR on carvedilol 6.25 mg BID for rate control and no AC therapy due to recent hemorrhagic stroke -Hold carvedilol 6.25 mg BID in setting of hypotension -Continue b/l SCD's -D/C telemetry monitoring in setting of palliative status  -Appreciate neurology recommendations -->[facility] Pear[facility] BrownieSethi, [facility] in setting of patient's palliative status   Systolic Congestive Failure - Last 2D-echo on 09/16/13 with reduced EF of 30-35% and akinesis of the entire anterior myocardium. LV diastolic could not be assessed. -Obtain daily weight (132 to 134lb) and I &O's (550 mL urine output yesterday)  -Caution with IVF's   Ischemic Stroke with hemorrhagic conversion - Pt with hemorrhagic conversion of a right lentiform nucleus infarct s/p IV tPA, infarct embolic secondary to atrial fibrillation during last hospitalization 1/6-1/12.  CT Head w/o contrast with expected evolution of intracranial  hemorrhage and not isodense, so no AP or AC therapy was initiated -Appreciate neurology recommendations -->Per Dr. Pearlean Brownie, [facility] no longer warranted in setting of patient's palliative status   Coronary Artery Disease - Pt with acute anterolateral myocardial infarction that occurred on 02/03/2012 with successful PTCA and stenting of the mid LAD with a non-drug-eluting 3.5 x 24  mm Veriflex stent.  -Simvastatin 10 mg daily   -Nitroglycerin as needed for CP   Non-insulin-Type II Diabetes Mellitus - CBG 141 on 1/28. HbA1c on 6.5 on 09/16/13. Pt on metformin 500 mg BID at home.  -Hold home metformin 500 mg BID during hospitalization  -Monitor glucose frequently  -Continue moderate insulin sliding scale -If uncontrolled consider adding Lantus    Hypertension - Currently normotensive  -Hold home rampiril 1.25 mg daily in setting of hypotension and on discharge -Hold carvedilol 6.25 mg BID in setting of hypotension and on discharge  Hypothyroidism - Last TSH 1.327 on 02/03/12.  -Continue levothyroxine 125 mcg daily  -Obtain TSH --> wnl   Rheumatoid Arthritis -currently with mild b/l MCP swelling without joint pain, warmth, or erythema  -Hold home methotrexate 10 mg weekly in setting of infection, resume on Saturday  Gout - currently stable without flare  -Continue allopurinol 100 mg daily   Chronic Thrombocytopenia -  Pt with no active bleeding. Platelet count on admission of 196K with baseline 100-110K. -Continue to monitor  -Hold AP and AC therapy   Anion Gap - Resolved. Pt with AG of 20 on admission most likely due to lactic acidosis due to sepsis, now closed to 12 most likely due to elevated lactic acid due to sepsis.     -Continue to monitor  Hypokalemia -  Resolved. Pt with mild potassium of 3.4 most likely due to decreased PO intake. s hypomagnesemia  -Supplement with KCl as needed -Continue to monitor   Acute Kidney Injury - Resolved. Pt with last Cr was 0.78 on 1/10, on admission 1.17, now 0.72. Most likely due to pre-renal azotemia (hypolemia) as resolved with administration of IVF's and FeNa (0.2%) <1%  -Hold home ramipril  -Obtain daily weight (132 to 134lb) and I &O's (550 mL urine output yesterday)  -Maintain foley catheter    Diet: Dysphagia 1  DVT PPx: SCDs  Code: DNR/DNI    Dispo: today with home hospice  The patient does have a  current PCP Milana Obey, MD) and does need an [facility] hospital follow-up appointment after discharge.  The patient does not have transportation limitations that hinder transportation to clinic appointments.  Services Needed at time of discharge: Y = Yes, Blank = No PT: SNF  OT:   RN:   Equipment:   Other:     LOS: 7 days   Otis Brace, MD 10/08/2013, 11:55 AM

## 2013-10-08 NOTE — Progress Notes (Addendum)
Notified by Nelva Nay, patient and family request services of [facility]) after discharge.  Spoke late afternoon yesterday, with granddaughter Adrian Saran and her mother/pt's d-i-l at bedside initiated education related to hospice services, philosophy and team approach to care -good understanding of information provided was voiced.  Per discussion and CMRN notes plan is for possible d/c today, Thursday, when arrangements are in place- per discussion, daughter would like AP&P mattress pad in place prior to discharge (see below);  pt to transport by non-emergent ambulance; family request Foley be left in place for comfort;  *Please see PMT Dr Lamar Blinks note 10/07/13 re: recommendations for home meds/palliative wound care, etc  *Please send completed GOLD DNR form home with pt  DME needs discussed pt currently has a hospital bed through [facility] in the home  *request is for an over bed table AND AP&P(Alternating Pump & Pressure) mattress to be delivered and set up on current bed prior to pt discharge - *Please contact granddtr Adrian Saran @ [phone] to arrange delivery  *NOTE: facesheet is incorrect -pt to d/c to granddtr's home address: [address] [place] [address] - this is not the address on current facesheet  Pt's PCP/attending with [facility] is Dr Sudie Bailey; pharmacy [facility] [address] Initial paperwork faxed to [facility]  Please notify [facility] when patient is ready to leave unit at d/c call [phone] (or [phone] if after 5 pm);  [facility] information and contact numbers also given to granddaughter Onita during visit.   Above information will be shared with [facility] Sarah Please call with any questions or concerns   Valente David, RN 10/08/2013, 7:20 AM [facility] Liaison [phone]

## 2013-10-08 NOTE — Discharge Summary (Signed)
Name: [name] MRN: [id number] DOB: 10-Feb-1921 80 y.o. PCP: Milana Obey, MD  Date of Admission: 10/01/2013  9:55 AM Date of Discharge: 10/08/2013 Attending Physician: Farley Ly, MD  Discharge Diagnosis:  Primary: Sepsis most likely due to E. Coli UTI   Hypovolemic Hypernatremia  Elevated Troponin  Unstageable Sacral Pressure Ulcer Hypoalbuminemia and severe malnutrition  Atrial Fibrillation Systolic Congestive Failure  Ischemic Stroke with hemorrhagic conversion.  Coronary Artery Disease  Non-insulin-Type II Diabetes Mellitus Hypertension Hypothyroidism  Rheumatoid Arthritis  Gout Chronic Thrombocytopenia  Anion Gap  Hypokalemia Acute Kidney Injury   Discharge Medications:   Medication List    STOP taking these medications       carvedilol 6.25 MG tablet  Commonly known as:  COREG     ramipril 1.25 MG capsule  Commonly known as:  ALTACE      TAKE these medications       allopurinol 100 MG tablet  Commonly known as:  ZYLOPRIM  Take 100 mg by mouth daily.     bimatoprost 0.03 % ophthalmic solution  Commonly known as:  LUMIGAN  Place 1 drop into both eyes at bedtime.     brinzolamide 1 % ophthalmic suspension  Commonly known as:  AZOPT  Place 1 drop into both eyes 3 (three) times daily.     COMBIGAN 0.2-0.5 % ophthalmic solution  Generic drug:  brimonidine-timolol  Place 1 drop into both eyes every 12 (twelve) hours.     docusate sodium 100 MG capsule  Commonly known as:  COLACE  Take 100 mg by mouth daily as needed for mild constipation.     levofloxacin 250 MG tablet  Commonly known as:  LEVAQUIN  Take 1 tablet (250 mg total) by mouth daily.  Start taking on:  10/09/2013     levothyroxine 125 MCG tablet  Commonly known as:  SYNTHROID, LEVOTHROID  Take 125 mcg by mouth daily.     METAMUCIL PO  Take 1 packet by mouth daily as needed (constipation).     metFORMIN 500 MG tablet  Commonly known as:  GLUCOPHAGE  Take 500  mg by mouth 2 (two) times daily with a meal.     methotrexate 2.5 MG tablet  Commonly known as:  RHEUMATREX  - Take 10 mg by mouth once a week. Caution:Chemotherapy. Protect from light.  - On Saturdays     morphine CONCENTRATE 10 mg / 0.5 ml concentrated solution  Place 0.25 mLs (5 mg total) under the tongue every 2 (two) hours as needed for moderate pain, severe pain or shortness of breath.     nitroGLYCERIN 0.4 MG SL tablet  Commonly known as:  NITROSTAT  Place 0.4 mg under the tongue every 5 (five) minutes as needed for chest pain.     pravastatin 20 MG tablet  Commonly known as:  PRAVACHOL  Take 20 mg by mouth every evening.     prednisoLONE acetate 1 % ophthalmic suspension  Commonly known as:  PRED FORTE  Place 1 drop into the right eye 2 (two) times daily.     RESOURCE THICKENUP CLEAR Powd  Take 120 g by mouth as needed (thicken liquids to nectar thick consistency).     vitamin B-12 1000 MCG tablet  Commonly known as:  CYANOCOBALAMIN  Take 1,000 mcg by mouth daily.        Disposition and follow-up:   Ms.[name] was discharged from [facility] in Stable condition.  At the hospital  follow up visit please address:  1.  Home health hospice with comfort care measures      Per Palliative MD, continuation of home medications, SL morphine Q2 hr PRN      Blood Pressure - holding home ramipril and carvedilol       Completion of antibiotic course- 2 additional days of levofloxacin beginning 1/30      Encourage fluid intake (hypernatremia)      Sacral Pressure wound ulcer care and management        Maintain foley catheter    2.  Labs / imaging needed at time of follow-up: comfort care   3.  Pending labs/ test needing follow-up: none  Follow-up Appointments:     Follow-up Information   Follow up with Gates Rigg, MD On 01/07/2014. (2:30pm)    Specialties:  Neurology, Radiology   Contact information:   [address] [place] [address] [phone]       Follow up with [facility] ([facility] to follow aftr d/c; pls notify whn pt ready to leave unit at d/c call [phone]-or if aftr 5 pm [phone])    Contact information:   [facility] [address] [place] [phone]      Discharge Instructions:  Future Appointments Provider Department Dept Phone   01/07/2014 2:45 PM Micki Riley, MD [facility] [phone]      Consultations: Treatment Team:  Palliative [facility]  Procedures Performed:  Dg Elbow 2 Views Right  09/17/2013   CLINICAL DATA:  Elbow pain  EXAM: RIGHT ELBOW - 2 VIEW  COMPARISON:  None.  FINDINGS: Negative for fracture or joint malalignment. The ventral fat pad is mildly up lifted, likely due to degenerative spurs rather than effusion. There is chondrocalcinosis. Dorsally, there is ill-defined high density within the distended olecranon bursa. Chronic insertional changes to the olecranon. Osteopenia.  IMPRESSION: 1. No acute osseous abnormality. 2. Olecranon bursitis with either mineralized debris or tophi. 3. Mild elbow arthritis with chondrocalcinosis.   Electronically Signed   By: Tiburcio Pea M.D.   On: 09/17/2013 23:17   Ct Head Wo Contrast  10/01/2013   CLINICAL DATA:  Increasing lethargy.  EXAM: CT HEAD WITHOUT CONTRAST  TECHNIQUE: Contiguous axial images were obtained from the base of the skull through the vertex without intravenous contrast.  COMPARISON:  09/19/2013.  FINDINGS: The right lenticular nucleus hemorrhage with extension into the right corona radiata is less dense than on the prior examination consistent with red cell lysis cysts with surrounding vasogenic edema. Although slow, this represent expected evolution of intracranial hemorrhage. Mass-effect upon the right ventricle has decreased in the interim.  MR detected tiny amount hemorrhage left parietal lobe not appreciated on present CT.  Remote right  occipital lobe infarct.  Small vessel disease type changes without CT evidence of large acute thrombotic infarct.  No intracranial mass lesion separate from above described findings.  Atrophy. Ventricular prominence probably related to atrophy rather than hydrocephalus  Tiny amount of gas in the left cavernous sinus region may be related to IV.  IMPRESSION: The right lenticular nucleus hemorrhage with extension into the right corona radiata is less dense than on the prior examination consistent with red cell lysis cysts with surrounding vasogenic edema. Although slow, this represent expected evolution of intracranial hemorrhage. Mass-effect upon the right ventricle has decreased in the interim.   Electronically Signed   By: Bridgett Larsson M.D.   On: 10/01/2013 12:19   Ct Head Wo  Contrast  09/19/2013   CLINICAL DATA:  Evaluate hemorrhagic stroke  EXAM: CT HEAD WITHOUT CONTRAST  TECHNIQUE: Contiguous axial images were obtained from the base of the skull through the vertex without intravenous contrast.  COMPARISON:  Prior MRI from 09/16/2013  FINDINGS: Hemorrhagic stroke involving primarily the right lentiform nucleus is again seen, measuring approximately 3.8 x 2.3 cm, grossly similar to prior study. No definite hyperdensity seen within the right caudate head. There is increased associated vasogenic edema within the adjacent basal ganglia and right external capsule. Partial effacement of the right lateral ventricle is also increased with approximately 3 mm of right-to-left midline shift now seen. No hydrocephalus or intraventricular extension of hemorrhage. Extension of the right MCA territory infarct to involve the medial right temporal lobe is grossly similar.  Focal hypodensity within the right cerebellar hemisphere is consistent with previous identified right cerebellar ischemic infarct. No hemorrhage seen at this location.  Age-related atrophy with chronic microvascular ischemic disease again seen. No  extra-axial fluid collection. Encephalomalacia within the right occipital lobe is consistent with remote infarct, unchanged.  Calvarium remains intact. Orbits are unremarkable. Paranasal sinuses and mastoid air cells remain clear.  IMPRESSION: 1. Similar size of hemorrhagic stroke involving the right lentiform nucleus measuring 3.8 x 2.3 cm on today's study. 2. Increased vasogenic edema about the hemorrhagic stroke in the right lentiform nucleus with slightly increased effacement of the right lateral ventricle and slightly increased right-to-left midline shift now measuring 3 mm. No hydrocephalus. 3. Subcentimeter hypodensity within the right cerebellar hemisphere, compatible with previous identified ischemic infarct at this location. 4. No new intracranial hemorrhage or infarct identified. 5. Remote right occipital lobe infarct, unchanged. 6. Advanced age-related atrophy and chronic microvascular ischemic disease, unchanged.   Electronically Signed   By: Rise Mu M.D.   On: 09/19/2013 06:48   Ct Head Wo Contrast  09/15/2013   CLINICAL DATA:  Slurred speech.  EXAM: CT HEAD WITHOUT CONTRAST  TECHNIQUE: Contiguous axial images were obtained from the base of the skull through the vertex without intravenous contrast.  COMPARISON:  None.  FINDINGS: No intracranial hemorrhage.  Slightly hyperdense right carotid terminus and M1 segment of the right middle cerebral artery. Thrombus not excluded in proper clinical setting.  Right occipital white matter type changes spare the overlying gray matter as may be expected with encephalomalacia from prior infarct. Vasogenic edema therefore not entirely excluded (although no surrounding mass effect upon the lateral ventricle). Contrast-enhanced examination could be performed to exclude underlying enhancing lesion.  Mild atrophy. Ventricular prominence felt to be related to atrophy rather than hydrocephalus.  The dens projects superiorly which may reflect cranial  settling with mild encroachment on the cervical medullary junction.  IMPRESSION: No intracranial hemorrhage.  Slightly hyperdense right carotid terminus and M1 segment of the right middle cerebral artery. Thrombus not excluded in proper clinical setting.  Right occipital white matter type changes may represent result of prior infarct although slightly atypical as noted above.  Mild atrophy.  The dens projects superiorly which may reflect cranial settling with mild encroachment on the cervical medullary junction.  These results were called by telephone at the time of interpretation on 09/15/2013 at 4:18 PM to Dr. Cyril Mourning, who verbally acknowledged these results.   Electronically Signed   By: Bridgett Larsson M.D.   On: 09/15/2013 16:27   Mr Brain Wo Contrast  09/16/2013   CLINICAL DATA:  New onset of left-sided weakness and right gaze preference. Status post tPA. Right MCA occlusion.  EXAM: MRI HEAD WITHOUT CONTRAST  TECHNIQUE: Multiplanar, multiecho pulse sequences of the brain and surrounding structures were obtained without intravenous contrast.  COMPARISON:  CT head without contrast 09/15/2013.  FINDINGS: A punctate acute nonhemorrhagic infarct is noted within the right cerebellum. A new hemorrhagic infarct is present over the right basal ganglia. The largest area of hemorrhages centered by over the lentiform nucleus, measuring 1.8 x 3.6 x 3.5 cm. Additional petechial hemorrhage infiltrates the caudate nucleus in. There is surrounding edema, particularly within the external capsule. There is partial effacement of the right lateral ventricle. 1-2 mm of midline shift is evident.  Remote encephalomalacia is noted in the right occipital lobe. A there are remote blood products associated with this infarct. 9 mm focus of remote hemorrhage is seen in the high left parietal lobe.  Moderate generalized atrophy and white matter disease otherwise likely reflects the sequela of chronic microvascular ischemia.  Flow is present in  the major intracranial arteries, including the right middle cerebral artery. The patient is status post bilateral lens extractions. The paranasal sinuses are clear. The mastoid air cells are clear.  IMPRESSION: 1. Hemorrhagic conversion of the right MCA infarct with a prominent hemorrhage involving the right lentiform nucleus in the 2nd hemorrhage involving the right caudate head. 2. Partial effacement of the right lateral ventricle and minimal midline shift. 3. Remote encephalomalacia of the right occipital lobe with a a minimal amount of remote blood products. 4. 9 mm area of remote hemorrhage involving the high left parietal lobe. 5. Moderate generalized atrophy and white matter otherwise suggests chronic microvascular ischemia. Critical Value/emergent results were called by telephone at the time of interpretation on 09/16/2013 at 7:24 PM to Dr. Amada Jupiter, who verbally acknowledged these results.   Electronically Signed   By: Gennette Pac M.D.   On: 09/16/2013 19:28   Dg Chest Port 1 View  10/01/2013   CLINICAL DATA:  Fever and cerebral infarction.  EXAM: PORTABLE CHEST - 1 VIEW  COMPARISON:  09/16/2013  FINDINGS: There is stable cardiomegaly. No overt edema, infiltrate or pleural fluid is identified. The visualized bony thorax is unremarkable.  IMPRESSION: No acute infiltrate identified.  Stable cardiomegaly.   Electronically Signed   By: Irish Lack M.D.   On: 10/01/2013 11:46   Dg Chest Port 1 View  09/16/2013   CLINICAL DATA:  History of CVA, post angiogram images.  EXAM: PORTABLE CHEST - 1 VIEW  COMPARISON:  None.  FINDINGS: The cardiac silhouette is enlarged. The pulmonary vascularity is prominent centrally. There is no pleural effusion. There is no alveolar infiltrate. The observed portions of the bony structures appear normal.  IMPRESSION: There is mild enlargement of cardiac silhouette and prominence of the central pulmonary vascularity which may reflect a low-grade CHF. There is no evidence  of atelectasis or nor pneumonia.   Electronically Signed   By: David  [place]   On: 09/16/2013 12:29   Dg Hand Complete Right  09/17/2013   CLINICAL DATA:  Right index finger pain.  EXAM: RIGHT HAND - COMPLETE 3+ VIEW  COMPARISON:  None.  FINDINGS: The bones are osteopenic. There is ill-defined mineralized material around the joints, best seen in the medial to the wrist joint. There are marginal and juxta-articular erosions which appear chronic, best seen around the 5th MCP joint and 3rd DIP joint. There is extensive erosion of the index finger DIP joint, with extensive bony loss of the distal phalanx. No ill-defined osseous erosion suggestive of osseous infection.No acute fracture seen.  IMPRESSION: Erosive arthritis, most consistent with tophaceous gout. Joint destruction most advanced at the index DIP.   Electronically Signed   By: Tiburcio Pea M.D.   On: 09/17/2013 23:50   Dg Swallowing Func-speech Pathology  10/06/2013   Riley Nearing Deblois, CCC-SLP     10/06/2013 11:11 AM Objective Swallowing Evaluation: Modified Barium Swallow Patient Details  Name: [name] MRN: [id number] Date of Birth: May 22, 1921  Today's Date: 10/05/2013 Time: 1013-1036 SLP Time Calculation (min): 23 min  Past Medical History:  Past Medical History  Diagnosis Date  . Diabetes mellitus   . Glaucoma   . A-fib   . Myocardial infarction     3.5x46mm Veriflex stent 02/03/2012  . Coronary artery disease   . Hypothyroidism   . CHF (congestive heart failure)   . Arthritis     RA  . Stroke 09/2013   Past Surgical History:  Past Surgical History  Procedure Laterality Date  . Eye surgery    . Cardiac catheterization  02/03/12  . Tonsillectomy     HPI:  Pt is a 80 yo F with h/o recent hemorrhagic stroke (right  lenticular nucleus hemorrhage with extension into the right  corona radiata), CHF, T2DM who presents with increased lethargy.  Pt found to have Sepsis possibly secondary to UTI. MD notes state  Pt also has history of aspiration,  but given clear CXR,  aspiration pneumonia is unlikely source of infection. MBS on 1/8  recommended puree and pudding. Pt remained lethargic during  admission and was d/c'd before demosntrating any functional  gains. Grandaughter reports she was consuming pureed solids and  honey thick liquids at home but speech therapist was trialing  upgraded textures with success.      Assessment / Plan / Recommendation Clinical Impression  Dysphagia Diagnosis: Moderate oral phase dysphagia;Severe  pharyngeal phase dysphagia Clinical impression: Pt demonstrates little functional change  since last MBS recommending pudding thick liquids. Pt continues  to have 5-10 second periods of tongue pumping with spillage of  liquids to pyriforms and into airway prior to the swallow with  silent aspiration before and after the swallow (of residuals),   Pt can expel penetrate with cues to clear throat, but is unable  to complete a second swallow in a timely manner to transit  residuals before repenetration. Though there is still some trace  penetration with honey teaspoon, pt has been tolerating this diet  at home. Recommend she continue a dys 1 ( puree) texture with  honey thick liquids via teaspoon. There is potential to upgrade  to ground diet if it would improve QOL. SLP will follow for  tolerance, trials and family ed.     Treatment Recommendation  Therapy as outlined in treatment plan below    Diet Recommendation Dysphagia 1 (Puree);Honey-thick liquid   Liquid Administration via: Spoon Medication Administration: Crushed with puree Supervision: Full supervision/cueing for compensatory  strategies;Staff to assist with self feeding;Trained caregiver to  feed patient Compensations: Slow rate;Small sips/bites;Clear throat  intermittently;Multiple dry swallows after each bite/sip Postural Changes and/or Swallow Maneuvers: Seated upright 90  degrees;Upright 30-60 min after meal    Other  Recommendations Oral Care Recommendations: Oral care BID  Other Recommendations: Order thickener from pharmacy   Follow Up Recommendations  Home health SLP    Frequency and Duration min 2x/week  2 weeks   Pertinent Vitals/Pain NA    SLP Swallow Goals     General HPI: Pt is a 80 yo F with h/o recent  hemorrhagic stroke  (right lenticular nucleus hemorrhage with extension into the  right corona radiata), CHF, T2DM who presents with increased  lethargy. Pt found to have Sepsis possibly secondary to UTI. MD  notes state Pt also has history of aspiration, but given clear  CXR, aspiration pneumonia is unlikely source of infection. MBS on  1/8 recommended puree and pudding. Pt remained lethargic during  admission and was d/c'd before demosntrating any functional  gains. Grandaughter reports she was consuming pureed solids and  honey thick liquids at home but speech therapist was trialing  upgraded textures with success.  Type of Study: Bedside swallow evaluation Reason for Referral: Objectively evaluate swallowing function Previous Swallow Assessment: MBS1/7 Diet Prior to this Study: Dysphagia 1 (puree);Honey-thick liquids Temperature Spikes Noted: No Respiratory Status: Nasal cannula History of Recent Intubation: No Behavior/Cognition: Alert;Cooperative;Requires cueing Oral Cavity - Dentition: Edentulous Oral Motor / Sensory Function: Impaired - see Bedside swallow  eval Self-Feeding Abilities: Total assist Patient Positioning: Postural control interferes with function Baseline Vocal Quality: Clear Volitional Cough: Weak Volitional Swallow: Able to elicit Anatomy:  (Appearance of bony protrusions at C3/4 C4/5) Pharyngeal Secretions: Not observed secondary MBS    Reason for Referral Objectively evaluate swallowing function   Oral Phase Oral Preparation/Oral Phase Oral Phase: Impaired Oral - Honey Oral - Honey Teaspoon: Reduced posterior propulsion;Lingual  pumping;Right pocketing in lateral sulci;Left pocketing in  lateral sulci;Delayed oral transit Oral - Nectar Oral - Nectar  Teaspoon: Reduced posterior propulsion;Lingual  pumping;Right pocketing in lateral sulci;Left pocketing in  lateral sulci;Delayed oral transit Oral - Nectar Cup: Reduced posterior propulsion;Lingual  pumping;Right pocketing in lateral sulci;Left pocketing in  lateral sulci;Delayed oral transit Oral - Nectar Straw: Reduced posterior propulsion;Lingual  pumping;Right pocketing in lateral sulci;Left pocketing in  lateral sulci;Delayed oral transit Oral - Solids Oral - Puree: Reduced posterior propulsion;Lingual pumping;Right  pocketing in lateral sulci;Left pocketing in lateral  sulci;Delayed oral transit Oral - Mechanical Soft: Reduced posterior propulsion;Lingual  pumping;Right pocketing in lateral sulci;Left pocketing in  lateral sulci;Delayed oral transit   Pharyngeal Phase Pharyngeal Phase Pharyngeal Phase: Impaired Pharyngeal - Honey Pharyngeal - Honey Teaspoon: Premature spillage to pyriform  sinuses;Delayed swallow initiation;Penetration/Aspiration after  swallow;Trace aspiration;Pharyngeal residue -  valleculae;Pharyngeal residue - pyriform sinuses Penetration/Aspiration details (honey teaspoon): Material enters  airway, CONTACTS cords and not ejected out;Material enters  airway, remains ABOVE vocal cords and not ejected out Pharyngeal - Nectar Pharyngeal - Nectar Teaspoon: Delayed swallow  initiation;Premature spillage to pyriform  sinuses;Penetration/Aspiration before swallow;Trace  aspiration;Pharyngeal residue - valleculae;Pharyngeal residue -  pyriform sinuses Penetration/Aspiration details (nectar teaspoon): Material enters  airway, passes BELOW cords without attempt by patient to eject  out (silent aspiration) Pharyngeal - Nectar Cup: Not tested (pt could not position head  for cup sip) Pharyngeal - Nectar Straw: Delayed swallow initiation;Premature  spillage to pyriform sinuses;Penetration/Aspiration before  swallow;Significant aspiration (Amount);Pharyngeal residue -  valleculae;Pharyngeal residue -  pyriform sinuses Penetration/Aspiration details (nectar straw): Material enters  airway, passes BELOW cords without attempt by patient to eject  out (silent aspiration) Pharyngeal - Solids Pharyngeal - Puree: Delayed swallow initiation;Premature spillage  to valleculae Pharyngeal - Mechanical Soft: Delayed swallow  initiation;Premature spillage to valleculae  Cervical Esophageal Phase    GO             Harlon Ditty, MA CCC-SLP [phone]  Claudine Mouton 10/05/2013, 10:45 AM    Dg Swallowing Func-speech Pathology  09/17/2013   Riley Nearing Deblois, CCC-SLP     09/17/2013 11:09 AM Objective Swallowing  Evaluation: Modified Barium Swallowing Study   Patient Details  Name: [name] MRN: [id number] Date of Birth: 10-10-1920  Today's Date: 09/17/2013 Time: 4098-1191 SLP Time Calculation (min): 20 min  Past Medical History:  Past Medical History  Diagnosis Date  . Diabetes mellitus   . Glaucoma   . A-fib   . Myocardial infarction     3.5x39mm Veriflex stent 02/03/2012  . Coronary artery disease   . Hypothyroidism   . CHF (congestive heart failure)   . Arthritis     RA   Past Surgical History:  Past Surgical History  Procedure Laterality Date  . Eye surgery    . Cardiac catheterization  02/03/12  . Tonsillectomy     HPI:  80 y.o. female with acute onset left HP, left face weakness, and  dysarthria. NIHSS 19. CT brain with no acute ischemia but mildly  hyperdense right MCA. Her neurological syndrome is consistent  with a right MCA distribution infarct. S/P RT common carotid  arteriogram.     Assessment / Plan / Recommendation Clinical Impression  Dysphagia Diagnosis: Severe oral phase dysphagia;Severe  pharyngeal phase dysphagia Clinical impression: Pt demonstrates severely impaired function  with delayed and weak oral manipulation of honey thick and puree  boluses. Pt was lethargic, requiring max cues to participate. Pts  posture also worsens function; neck is contracted and head tilted  to left with pooling of  POs on the left. Honey thick liquids are  aspirated with sensation before/during the swallow. It is  difficult to visualize airway due to pts posture to determine if  all of aspirate is expelled with cough. Puree/pudding is  tolerated without aspiration, though a cued second swallow is  helpful to remove vallecular residue caused by oral residuals  spilling to valleculae post swallow. Pharyngeal strength is  adequate. Pt is recommended to consume puree diet and pudding  thick liquids at this time. Will initiate trials of honey  teaspoon when posture and arousal are maximal.     Treatment Recommendation  Therapy as outlined in treatment plan below    Diet Recommendation Dysphagia 1 (Puree);Pudding-thick liquid   Liquid Administration via: Spoon Medication Administration: Crushed with puree Supervision: Full supervision/cueing for compensatory  strategies;Staff to assist with self feeding Compensations: Slow rate;Small sips/bites;Check for anterior  loss;Multiple dry swallows after each bite/sip Postural Changes and/or Swallow Maneuvers: Seated upright 90  degrees    Other  Recommendations Oral Care Recommendations: Oral care BID Other Recommendations: Order thickener from pharmacy   Follow Up Recommendations       Frequency and Duration min 2x/week  2 weeks   Pertinent Vitals/Pain NA    SLP Swallow Goals     General HPI: 80 y.o. female with acute onset left HP, left face  weakness, and dysarthria. NIHSS 19. CT brain with no acute  ischemia but mildly hyperdense right MCA. Her neurological  syndrome is consistent with a right MCA distribution infarct. S/P  RT common carotid arteriogram. Type of Study: Modified Barium Swallowing Study Reason for Referral: Objectively evaluate swallowing function Previous Swallow Assessment: BSE 1/7 Diet Prior to this Study: Honey-thick liquids;Dysphagia 1 (puree) Temperature Spikes Noted: No Respiratory Status: Room air History of Recent Intubation: No Behavior/Cognition: Requires  cueing Oral Cavity - Dentition: Edentulous Oral Motor / Sensory Function: Impaired - see Bedside swallow  eval Self-Feeding Abilities: Total assist Patient Positioning: Postural control interferes with function Baseline Vocal Quality: Clear Volitional Cough: Weak Volitional Swallow: Able to elicit (with max cues) Pharyngeal Secretions:  Not observed secondary MBS    Reason for Referral Objectively evaluate swallowing function   Oral Phase Oral Preparation/Oral Phase Oral Phase: Impaired Oral - Honey Oral - Honey Teaspoon: Left anterior bolus loss;Weak lingual  manipulation;Lingual pumping;Incomplete tongue to palate  contact;Reduced posterior propulsion;Holding of bolus;Left  pocketing in lateral sulci;Delayed oral transit Oral - Solids Oral - Puree: Left anterior bolus loss;Weak lingual  manipulation;Lingual pumping;Incomplete tongue to palate  contact;Reduced posterior propulsion;Holding of bolus;Left  pocketing in lateral sulci;Delayed oral transit   Pharyngeal Phase Pharyngeal Phase Pharyngeal Phase: Impaired Pharyngeal - Honey Pharyngeal - Honey Teaspoon: Premature spillage to pyriform  sinuses;Delayed swallow initiation;Moderate  aspiration;Penetration/Aspiration before swallow Penetration/Aspiration details (honey teaspoon): Material enters  airway, passes BELOW cords and not ejected out despite cough  attempt by patient Pharyngeal - Solids Pharyngeal - Puree: Delayed swallow initiation;Premature spillage  to valleculae;Pharyngeal residue - valleculae  Cervical Esophageal Phase    GO             Harlon Ditty, MA CCC-SLP [phone]  [place]Blois, Riley Nearing 09/17/2013, 11:07 AM    Ir Angio Intra Extracran Sel Com Carotid Innominate Uni R Mod Sed  09/16/2013   CLINICAL DATA:  Acute onset of left-sided paralysis, right gaze deviation. Hyperdense right MCA sign.  EXAM: IR ANGIO INTRA EXTRACRAN SEL COM CAROTID INNOMINATE UNI RIGHT MOD SED AND RIGHT VERTEBRAL ARTERY ANGIOGRAM:  MEDICATIONS: Versed 2 mg IV. Fentanyl  50 mcg IV.  ANESTHESIA/SEDATION: Conscious sedation.  CONTRAST:  35 OMNIPAQUE IOHEXOL 300 MG/ML  SOLN  PROCEDURE: Following a full explanation of the procedure along with the potential associated complications, an informed witnessed consent was obtained.  The right groin was prepped and draped in the usual sterile fashion. Thereafter using modified Seldinger technique, transfemoral access into the right common femoral artery was obtained without difficulty. Over a 0.035-inch guidewire, a 5 [place] Pinnacle sheath was inserted. Through this and also over a 0.035-inch guidewire, a 5 [place] JB1 catheter was advanced to the aortic arch region and selectively positioned into the right common carotid artery and innominate artery.  There were no acute complications. The patient tolerated the procedure well.  COMPLICATIONS: None immediate.  FINDINGS: The right common carotid arteriogram demonstrates the right external carotid artery and its major branches to be grossly normal.  The right internal carotid artery at the bulb to the cranial skull base is normal.  The petrous, the cavernous and the supraclinoid segments are widely patent.  Normal opacification is seen of the right anterior cerebral artery into the capillary and venous phases.  There is complete angiographic occlusion of the right middle cerebral artery in its proximal one-third.  Transient opacification of the right posterior communicating artery is seen.  The right vertebral artery origin is normal. The vessel opacifies normally to the cranial skull base. There is normal opacification of the right vertebrobasilar junction and the right posterior inferior cerebellar artery.  The opacified portions of the basilar artery, the posterior cerebral arteries, the superior cerebellar arteries and the anterior inferior cerebellar arteries is normal into the delayed arterial phase.  IMPRESSION: Angiographically occluded right middle cerebral artery in its proximal  one-third.   Electronically Signed   By: Julieanne Cotton M.D.   On: 09/16/2013 08:41    2D Echo: none  Cardiac Cath: none  Admission HPI: Original Author Janifer Gieselman   Colon Flattery is a 80 year old woman with past medical history of hypertension, hyperlipidemia, non-insulin Type 2 DM, systolic CHF (30-35% on 09/26/13), CAD s/p MI and  stent, atrial fibrillation, CVA Jan 215, Hypothyroidism on replacement, rheumatoid arthritis, and gout who presents with altered mental status per family.  Per family pt lives at home and was feeling her normal self until seen by PT yesterday during which time she was fatigued and could not participate in activities. She was reported to be less vocal with decreased appetite, PO intake, and urine output that continued this morning. No reports of rhinorrhea, change in baseline cough, nausea, vomiting, abdominal pain, headache, or change in BM. She was found to have fever last night but could not take any medications. She has incontinence at baseline and wears chronic diaper.   She had recent stroke in January with left sided residual weakness, difficulty swallowing, and has been bedbound since that time. Since 1 week ago, pt has had enlarging ulcer on her buttocks region with no drainage or odor. She has not been on any AP or AC therapy since her stroke.  Hospital Course by problem list:    Sepsis most likely due to E. Coli UTI - Pt presented with SIRS+4 (T 100.9, HR 122, RR 28, WBC 16K with neutrophilia) and found to be hypotensive with blood pressure of 80/33 requiring fluid resuscitation. Lactic acid was elevated at 2.9 on admission which normalized during hospitalizaiton. Source of infection was unclear but most likely due to E. Coli UTI vs unstagable sacral pressure ulcer. Pt was found to be influenza negative with no acute cardiopulmonary disease on chest xray. Pt received IV ceftriaxone and levofloxacin with total of 8 days of antibiotics. Pt to take 2  additional days of levofloxacin 250 mg for pansensitive E. Coli UTI.  Blood cultures were negative for growth for 5 days. Pt clinically improved during hospitalization with resolution of leukocytosis and hypotension. Due to patient's age and co-morbidities, patient's code status was changed to DNR/DNI. Palliative care was consulted and the family decided to pursue home hospice with palliative care. Pt was then placed on morphine as needed for pain, anxiety, dyspnea with comfort feeding, limiting of blood draws, and uninterrupted rest. Pt to receive PRN SL morphine as needed for pain.   Hypovolemic Hypernatremia - Pt with sodium of admission of 155 with serum osmolarity of 336, urine osmolality of 689 with last sodium of 150 on 1/28 and range of 145-155 during hospitalization. Etiology most likely due to hypovolemia in setting of hypotension and decreased thirst mechanism as there was improvement of sodium with administration of dextrose 5% with correction of Na of 10-12 mEq in 24 hr period. Due to patient's change in status to palliative care, sodium levels were no longer checked. Pt encouraged to have adequate oral hydration at home.     Elevated Troponin - Pt with elevated troponin of 0.73 to 0.53 to 0.46 during hospitalization most likely due to demand ischemia in setting of sepsis. Pt also with history of MI s/p stent on 02/03/12. Due to patient's CT head findings, pt was not a candidate for [facility] therapy. Pt denied chest pain during hospitalization.   Unstageable Sacral Pressure Ulcer - Pt with unstagable pressure wound ulcer without of 1 week duration due to bed bound status after ischemic stroke prior this month. Pt was seen by wound care and continued on air mattress, prevalon boots, enzymatic debridement ointment and moist gauze. Pt to have palliative wound care at home and have air mattress delivered to home. Per wound care, "recommend foam dressings to the midback and sacral wounds to protect and  insulate. The sacrum may evolve  into deeper wound but with the goals of care for comfort I will lessen the burden on a caregiver with a dressing than can manage the exudate and maintain the wound at its current status possibly. Will need air mattress at home for offloading and comfort if possible. Prevalon boots to go home with patient to protect and offload heel pressure ulcers to protect from further breakdown and any extension of her current wounds."   Hypoalbuminemia and severe malnutrition - Pt with severe malnutrition in the context of chronic illness and albumin of 1.9. Pt had nutrition consultation and received dysphagia 1 diet with nutritional supplements during hospitalization. Pt with weight change of 125 to 134 lb during hospitalization.   Atrial Fibrillation - Pt remained in in atrial fibrillation with HR (43-133). Pt's carvedilol was held due to hypotension and low-normal blood pressure and to continue to held on discharge. Per neurology, no AP or AC therapy due to CT head findings (were not isodense). Pt was continued on bilateral SCD's during hospitalization. Pt was initially monitored on telemetry but later discontinued due patient's palliative status. Per neurology recommendations (Dr. Pearlean BrownieSethi), [facility] is warranted in setting of patient's palliative status.    Systolic Congestive Failure - Last 2D-echo on 09/16/13 with reduced EF of 30-35% and akinesis of the entire anterior myocardium. LV diastolic could not be assessed. Pt's weight (125 to 134 lb) and volume status (4.25L total output) was monitored during hospitalization.   Ischemic Stroke with hemorrhagic conversion - Pt with hemorrhagic conversion of a right lentiform nucleus infarct s/p IV tPA, infarct embolic secondary to atrial fibrillation during last hospitalization 1/6-1/12. CT Head w/o contrast with expected evolution of intracranial hemorrhage and not isodense, so no AP or AC therapy was initiated per neurology  recommendations and once pt became palliative, per Dr. Pearlean BrownieSethi,  [facility] or  [facility] no longer warranted.   Coronary Artery Disease - Pt with acute anterolateral myocardial infarction that occurred on 02/03/2012 with successful PTCA and stenting of the mid LAD with a non-drug-eluting 3.5 x 24 mm Veriflex stent. Pt was continued on home simvastatin daily and nitroglycerin as needed for CP   Non-insulin-Type II Diabetes Mellitus - Pt with last HbA1c on 6.5 on 09/16/13. Pt on metformin 500 mg BID at home which was held during hospitalization. Pt also received IV contrast 1/22. Pt received moderate insulin sliding scale with meals with glucose range of 117-375. Pt did not have any     Hypertension - Patient presented with hypotension 80/33 with blood pressure range of 75/35 - 141/49. Pt's home ramipril and carvedilol were in setting of hypotension and on discharge.   Hypothyroidism - Pt with TSH of 0.664 on 1/22. Pt was continued on home levothyroxine daily during hospitalization.    Rheumatoid Arthritis - Pt with mild b/l MCP swelling without joint pain, warmth, or erythema to suggest exacerbation. Pt's home methotrexate 10 mg weekly was held in setting of acute infection. Pt to resume weekly dose at time of discharge and adjusted per palliative care recommendations.    Gout - Pt did not have acute flare during hospitalization and was continued on home allopurinol daily during hospitalization.    Chronic Thrombocytopenia - Pt with no active bleeding bleeding during hospitalization. Platelet count on admission of 196K with baseline 100-110K and stable levels during hospitalization. AP and AC therapy were held during hospitalization.  Anion Gap - Pt with AG of 20 on admission most likely due to lactic acidosis due to  sepsis that resolved during   hospitalization.  Hypokalemia - Pt with mild potassium of 3.4 during hospitalization that normalized most likely due to decreased PO intake. Pt with normal magnesium  levels.   Acute Kidney Injury - Pt with last Cr was 0.78 on 1/10, on admission 1.17. Most likely due to pre-renal azotemia (hypolemia) due to resolution with administration of IVF's and FeNa (0.2%) <1% . Nephrotoxic agents (including home ramipril)  were held during hospitalization.       Discharge Vitals:   BP 141/49  Pulse 68  Temp(Src) 97.5 F (36.4 C) (Axillary)  Resp 18  Ht 5' 4.17" (1.63 m)  Wt 60.8 kg (134 lb 0.6 oz)  BMI 22.88 kg/m2  SpO2 100%  Discharge Labs:  Results for orders placed during the hospital encounter of 10/01/13 (from the past 24 hour(s))  GLUCOSE, CAPILLARY     Status: Abnormal   Collection Time    10/07/13 12:04 PM      Result Value Range   Glucose-Capillary 141 (*) 70 - 99 mg/dL    Signed: Otis Brace, MD 10/08/2013, 11:56 AM   Time Spent on Discharge:  60 minutes Services Ordered on Discharge: Home health hospice Equipment Ordered on Discharge: over bed table, AP & P mattress

## 2013-10-08 NOTE — Progress Notes (Addendum)
Per RNCM, pt is going home with hospice. CSW still following case to assist with transportation via [facility] if needed.  Addendum: CSW received call from RN with [facility], stating address in pt's chart is not the address pt will be discharged to--instead, pt going to granddaughter's home ([address] [place], [place], [address]). Pt will need gold DNR form filled out by MD or NP. CSW called RN and asked that if there is no form on the chart, to please call the MD and request he/she fill out this form or NP fill out this form. RN states she will do this.  AddendumDoylene Canning DNR form filled out and placed on pt's chart. MD will work on discharge summary after finished rounding. CSW will facilitate discharge once this is complete.  Addendum: CSW put discharge summary and AVS in discharge packet. Called PTAR. RN informed. RN to call pt's granddaughter. Hospice aware, stating they plan to meet with the family at the home at 6pm. CSW signing off.  Maryclare Labrador, MSW, [employer] Clinical Social Worker [phone]

## 2013-10-08 NOTE — Progress Notes (Signed)
Ambulance was here and took pt to her residence. Granddaughter aware pt is coming. Pt in no distress on departure. Ccm notified. Foley left in place for end of life care. Iv removed.

## 2013-10-08 NOTE — Progress Notes (Signed)
Physical Therapy Treatment Patient Details Name: ALEIYAH HALPIN MRN: [id number] DOB: 17-Sep-1920 Today's Date: 10/08/2013 Time: 7824-2353 PT Time Calculation (min): 24 min  PT Assessment / Plan / Recommendation  History of Present Illness Pt admit with sepsis.  Recent right CVA with left hemiplegia   PT Comments   Pt admitted with above. Pt currently with functional limitations due to balance, weakness and endurance deficits.  Pt will benefit from skilled PT to increase their independence and safety with mobility to allow discharge to the venue listed below.   Follow Up Recommendations  SNF;Supervision/Assistance - 24 hour                 Equipment Recommendations  Other (comment) (TBA)        Frequency Min 2X/week   Progress towards PT Goals Progress towards PT goals: Progressing toward goals  Plan Current plan remains appropriate    Precautions / Restrictions Precautions Precautions: Fall Precaution Comments: L sided neglect and hemiplegia Restrictions Weight Bearing Restrictions: No   Pertinent Vitals/Pain VSS, nopain per pt    Mobility  Bed Mobility Overal bed mobility: Needs Assistance;+2 for physical assistance Bed Mobility: Rolling;Sit to Supine;Sidelying to Sit Rolling: +2 for physical assistance;Max assist Sidelying to sit: Total assist;+2 for physical assistance Supine to sit: +2 for physical assistance;Total assist Sit to supine: +2 for physical assistance;Total assist General bed mobility comments:  Pt needs full assist, minimal initiation of movement on command, could reach with arm for rail and attempt to push with RUE. Otherwise unable to perform transfer.  Transfers Overall transfer level: Needs assistance Equipment used: 2 person hand held assist Transfers: Sit to/from [employer] Sit to Stand: +2 physical assistance;Total assist Squat pivot transfers: Total assist;+2 physical assistance General transfer comment: pt required 2+ total  (A) for sit to stand to extend at hips and continued to have fwd flexed posture during standing; required LEs to be blocked to prevent buckling. Blocked knees and assited with squat pivot with use of pad to assist. RN notified of need to use Maximove to transfer back to bed.      PT Goals (current goals can now be found in the care plan section)    Visit Information  Last PT Received On: 10/08/13 Assistance Needed: +2 History of Present Illness: Pt admit with sepsis.  Recent right CVA with left hemiplegia    Subjective Data  Subjective: pt anxious during session   Cognition  Cognition Arousal/Alertness: Lethargic Behavior During Therapy: Flat affect Overall Cognitive Status: History of cognitive impairments - at baseline Area of Impairment: Problem solving;Attention Current Attention Level: Focused Problem Solving: Slow processing    Balance  Balance Overall balance assessment: Needs assistance;History of Falls Sitting-balance support: Single extremity supported;Feet supported Sitting balance-Leahy Scale: Zero Sitting balance - Comments: pt with left anterior lean with cues and assist to achieve midline but pt unable to maintain and reverts back to extreme flexion of neck and trunkSat EOB for 10 min.  Washed pts back and face while at EOB.   Postural control: Left lateral lean  End of Session PT - End of Session Equipment Utilized During Treatment: Gait belt Activity Tolerance: Patient limited by fatigue Patient left: in chair;with call bell/phone within reach Nurse Communication: Mobility status;Need for lift equipment        INGOLD,Kamia Insalaco 10/08/2013, 3:53 PM [facility] [phone] (pager)

## 2013-10-08 NOTE — Progress Notes (Signed)
Nutrition Brief Note  Chart reviewed. Patient's goal of care is now comfort feeds. No further nutrition interventions warranted at this time.  Please re-consult as needed.   Maureen Chatters, RD, LDN Pager #: [phone] After-Hours Pager #: [phone]

## 2013-10-20 ENCOUNTER — Telehealth: Payer: Self-pay | Admitting: Neurology

## 2013-10-20 NOTE — Telephone Encounter (Signed)
Vicky from [facility] calling to state that she faxed over on 10/01/13 home health certification and plan of care for patient and it has not been returned yet. Please fax it back to [phone] and if questions please call Vickie.

## 2013-10-20 NOTE — Telephone Encounter (Signed)
Called vicky from [facility] and left message informing her that the patient's paperwork has been faxed.

## 2013-11-08 DEATH — deceased

## 2014-01-07 ENCOUNTER — Ambulatory Visit: Payer: Self-pay | Admitting: Neurology

## 2014-04-15 NOTE — Telephone Encounter (Signed)
Noted  

## 2014-07-02 IMAGING — CT CT HEAD W/O CM
2 series · 15 of 30 positions shown, 19 images · non-contrast
Comparison: Prior MRI from 09/16/2013

CLINICAL DATA: Evaluate hemorrhagic stroke

EXAM:
CT HEAD WITHOUT CONTRAST
TECHNIQUE: Contiguous axial images were obtained from the base of the skull
through the vertex without intravenous contrast.

[Series 2: head w/o · axial · non-contrast · 0.49mm/px · z∈[+77,+207]mm · 13 of 32 slices shown, 17 images]
[im 3/32  brain]
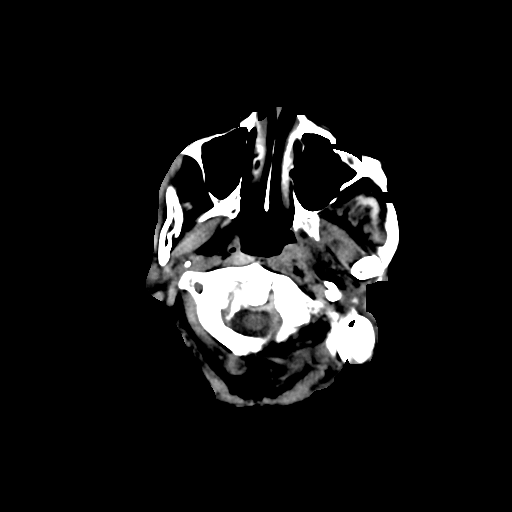
[im 3/32  bone]
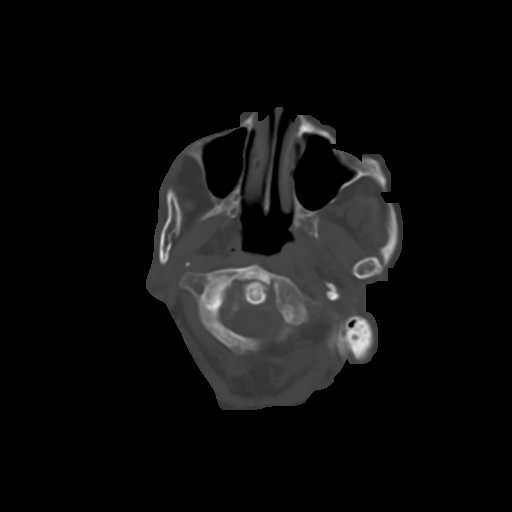
[im 5/32  brain]
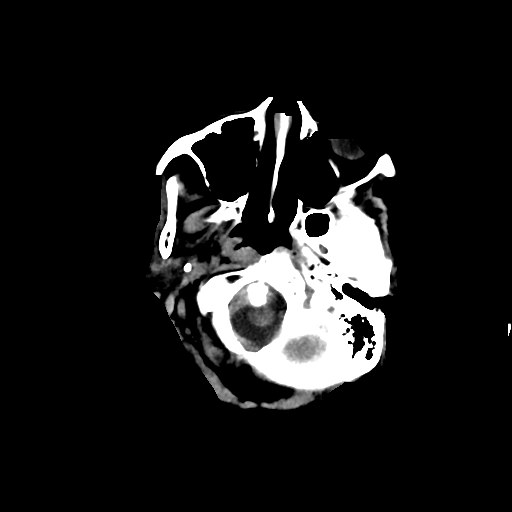
[im 7/32  brain]
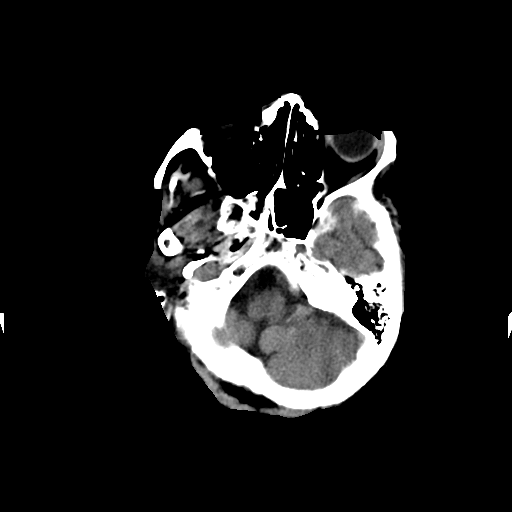
[im 9/32  brain]
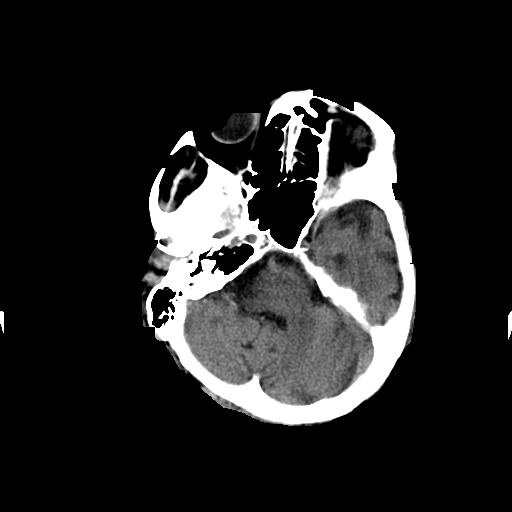
[im 12/32  brain]
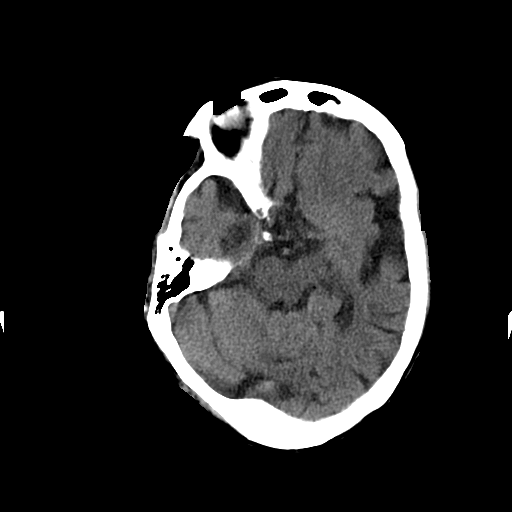
[im 12/32  bone]
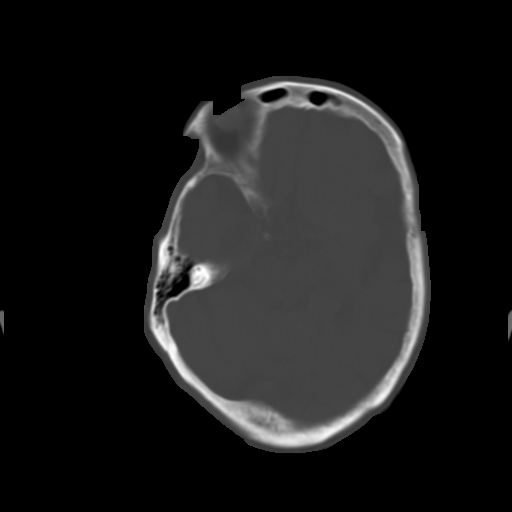
[im 14/32  brain]
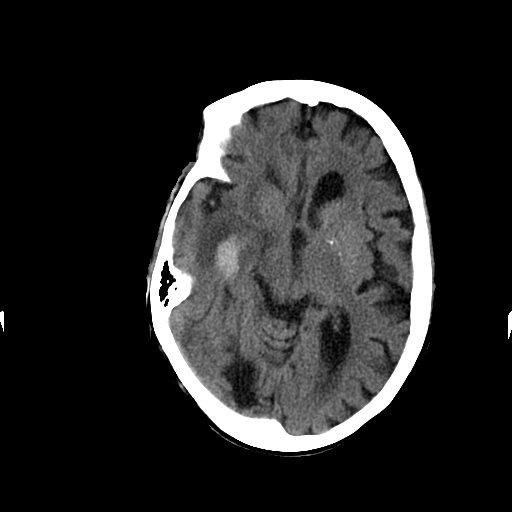
[im 16/32  brain]
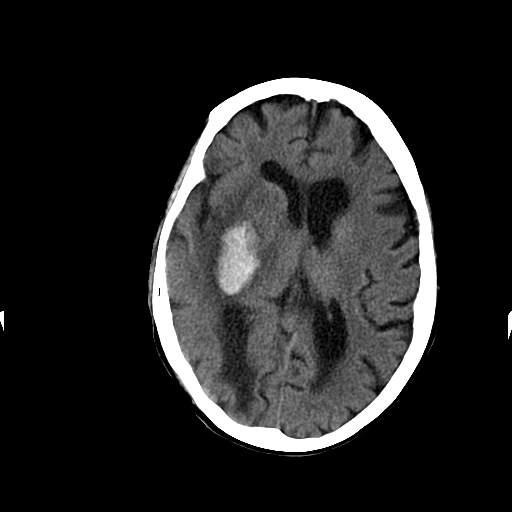
[im 18/32  brain]
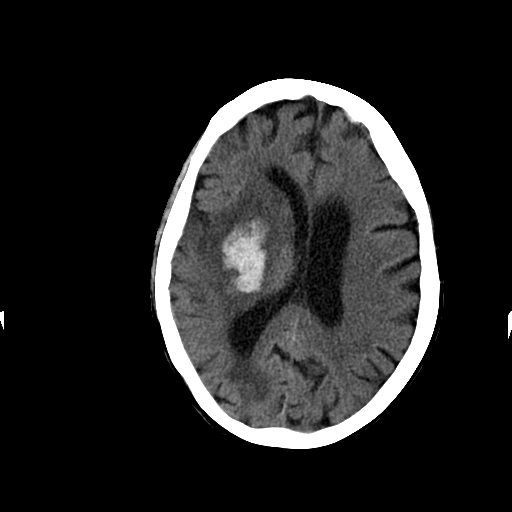
[im 20/32  brain]
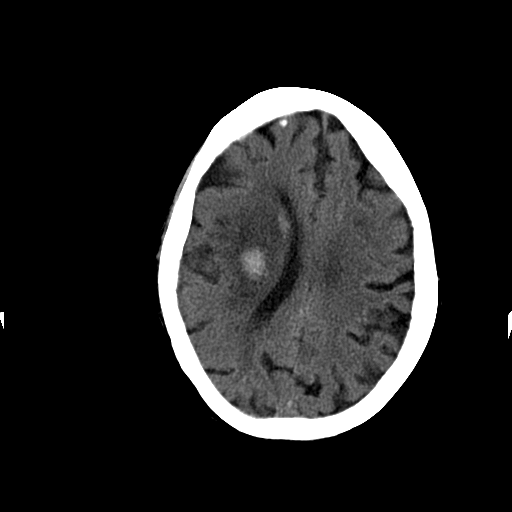
[im 20/32  bone]
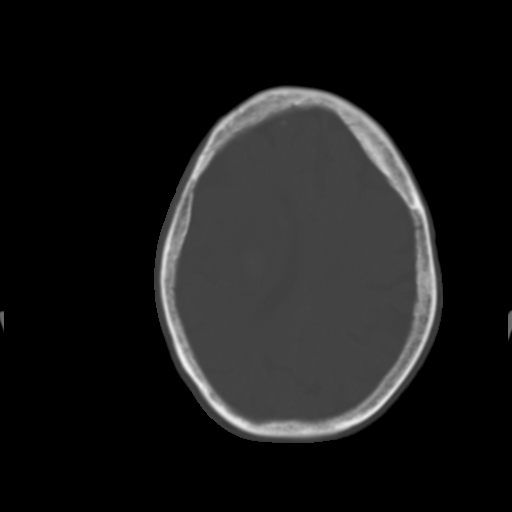
[im 23/32  brain]
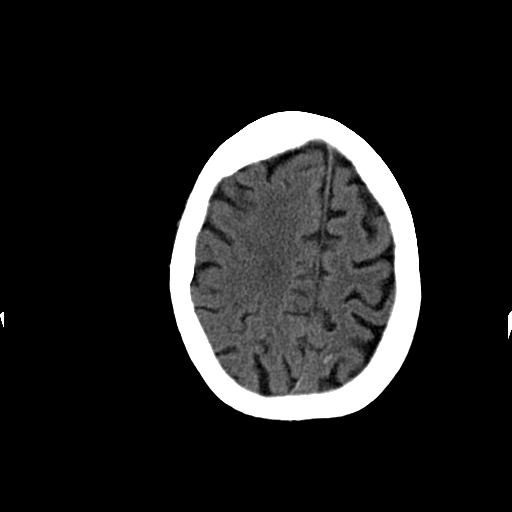
[im 25/32  brain]
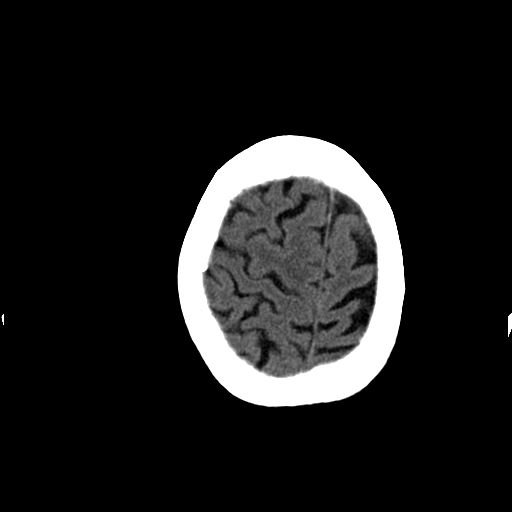
[im 27/32  brain]
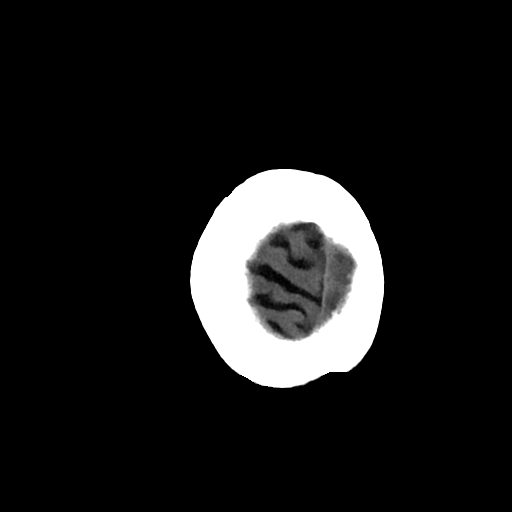
[im 29/32  brain]
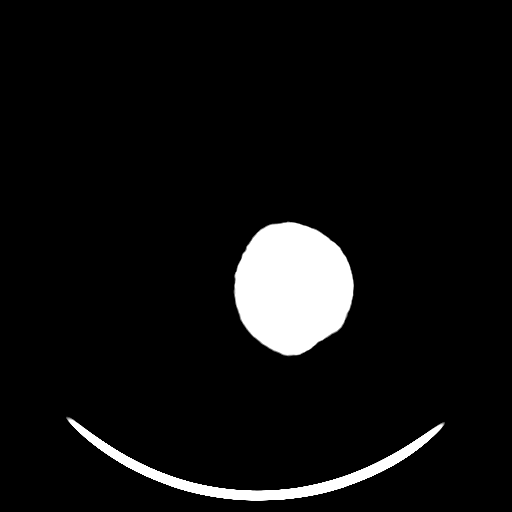
[im 29/32  bone]
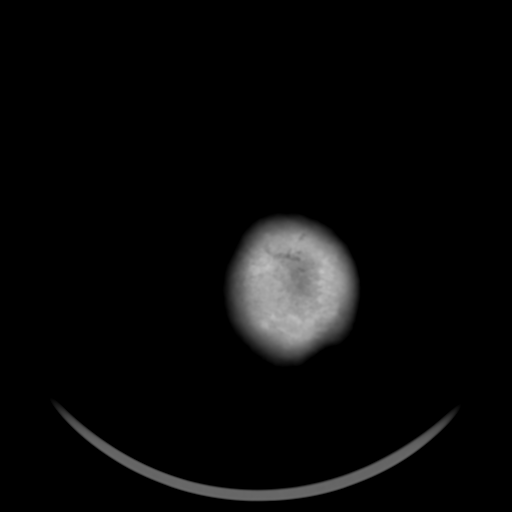

[Series 3: head w/o bone · axial · non-contrast · 0.49mm/px · z∈[+77,+97]mm · 2 of 32 slices shown]
[im 3/32  bone]
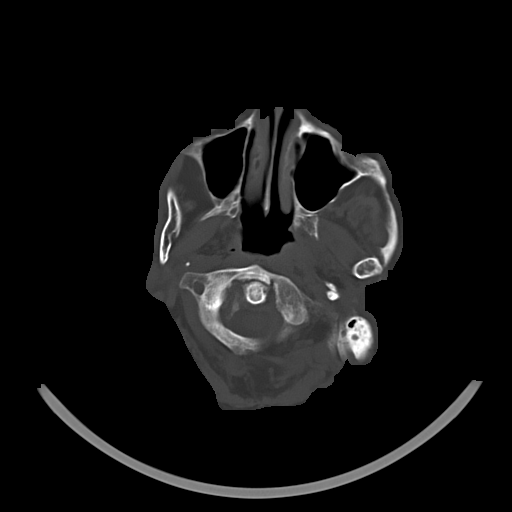
[im 7/32  bone]
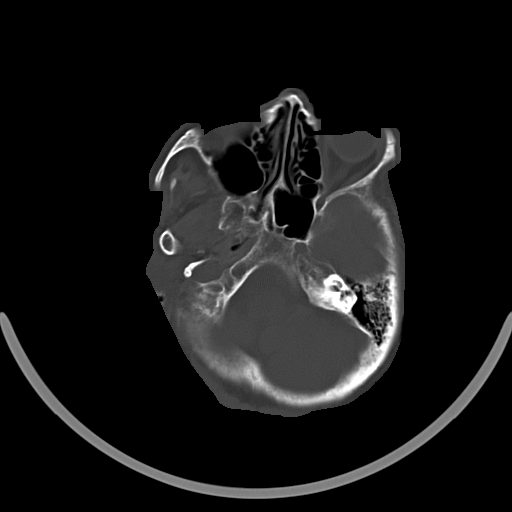

[15 of 30 positions shown; findings below may reference images not displayed]

FINDINGS: Hemorrhagic stroke involving primarily the right lentiform nucleus
is again seen, measuring approximately 3.8 x 2.3 cm, grossly similar
to prior study. No definite hyperdensity seen within the right
caudate head. There is increased associated vasogenic edema within
the adjacent basal ganglia and right external capsule. Partial
effacement of the right lateral ventricle is also increased with
approximately 3 mm of right-to-left midline shift now seen. No
hydrocephalus or intraventricular extension of hemorrhage. Extension
of the right MCA territory infarct to involve the medial right
temporal lobe is grossly similar.

Focal hypodensity within the right cerebellar hemisphere is
consistent with previous identified right cerebellar ischemic
infarct. No hemorrhage seen at this location.

Age-related atrophy with chronic microvascular ischemic disease
again seen. No extra-axial fluid collection. Encephalomalacia within
the right occipital lobe is consistent with remote infarct,
unchanged.

Calvarium remains intact. Orbits are unremarkable. Paranasal sinuses
and mastoid air cells remain clear.
IMPRESSION: 1. Similar size of hemorrhagic stroke involving the right lentiform
nucleus measuring 3.8 x 2.3 cm on today's study.
2. Increased vasogenic edema about the hemorrhagic stroke in the
right lentiform nucleus with slightly increased effacement of the
right lateral ventricle and slightly increased right-to-left midline
shift now measuring 3 mm. No hydrocephalus.
3. Subcentimeter hypodensity within the right cerebellar hemisphere,
compatible with previous identified ischemic infarct at this
location.
4. No new intracranial hemorrhage or infarct identified.
5. Remote right occipital lobe infarct, unchanged.
6. Advanced age-related atrophy and chronic microvascular ischemic
disease, unchanged.

## 2014-07-14 IMAGING — CT CT HEAD W/O CM
1 series · 15 of 29 positions shown, 19 images · non-contrast
Comparison: 09/19/2013.

CLINICAL DATA: Increasing lethargy.

EXAM:
CT HEAD WITHOUT CONTRAST
TECHNIQUE: Contiguous axial images were obtained from the base of the skull
through the vertex without intravenous contrast.

[Series 2: head 5.0 h30s · axial · 0.44mm/px · z∈[+1193,+1323]mm · 15 of 29 slices shown, 19 images]
[im 2/29  brain]
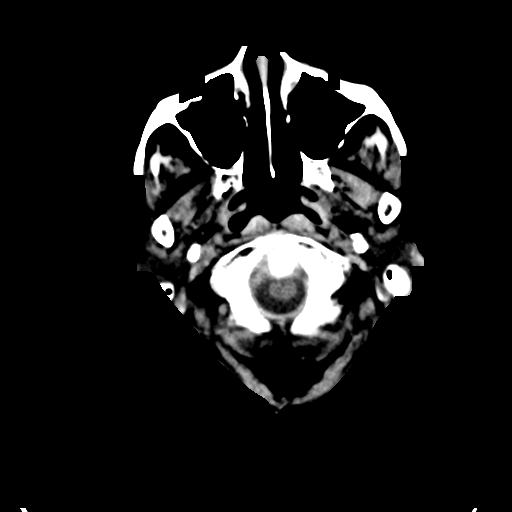
[im 2/29  bone]
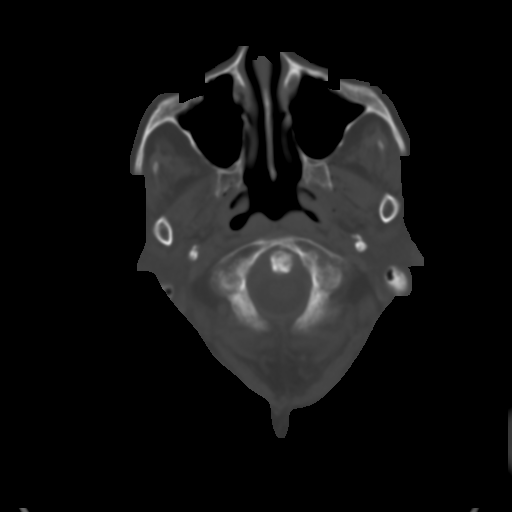
[im 4/29  brain]
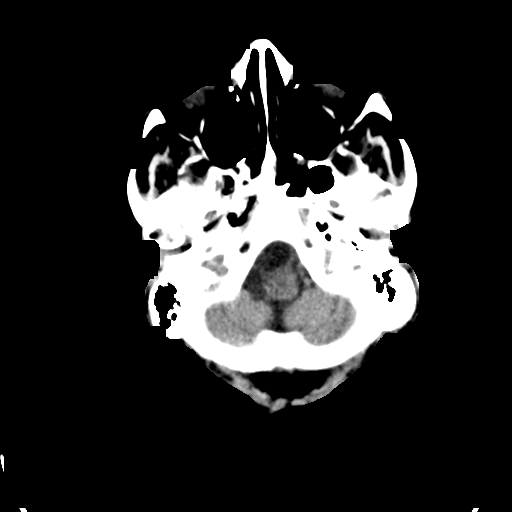
[im 6/29  brain]
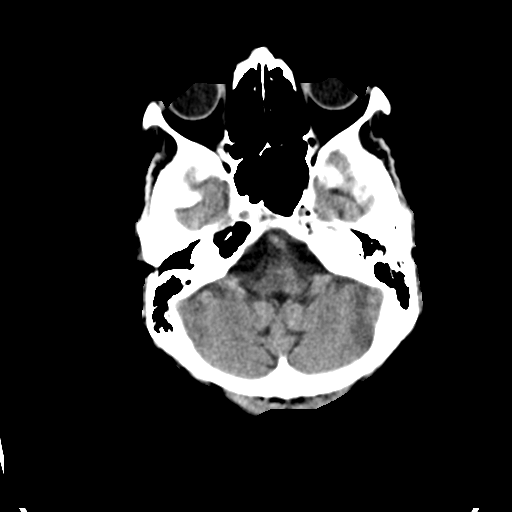
[im 8/29  brain]
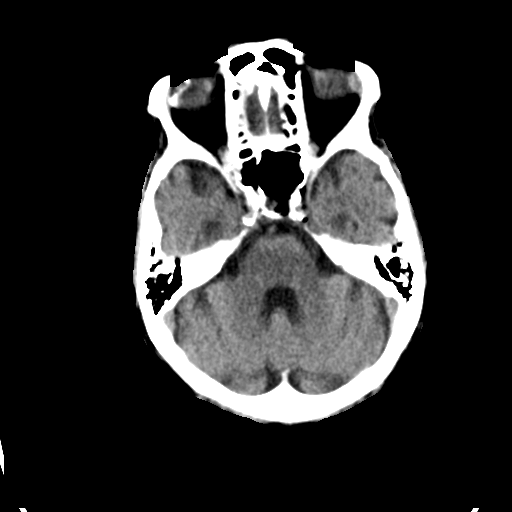
[im 10/29  brain]
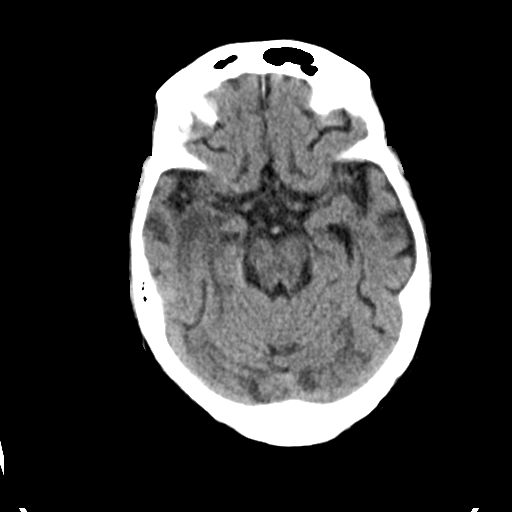
[im 10/29  bone]
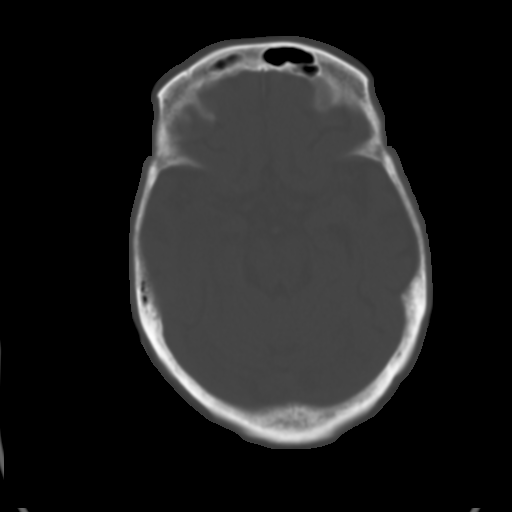
[im 11/29  brain]
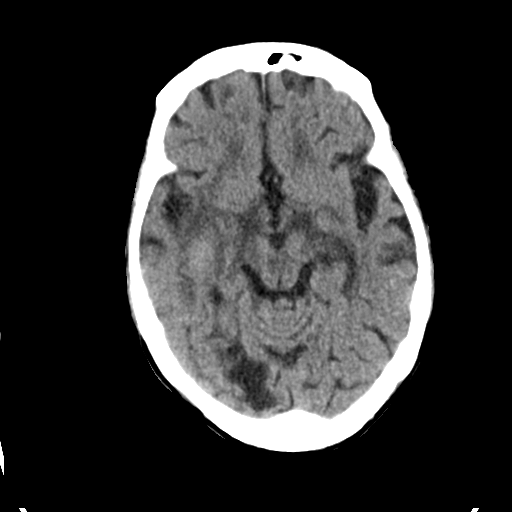
[im 13/29  brain]
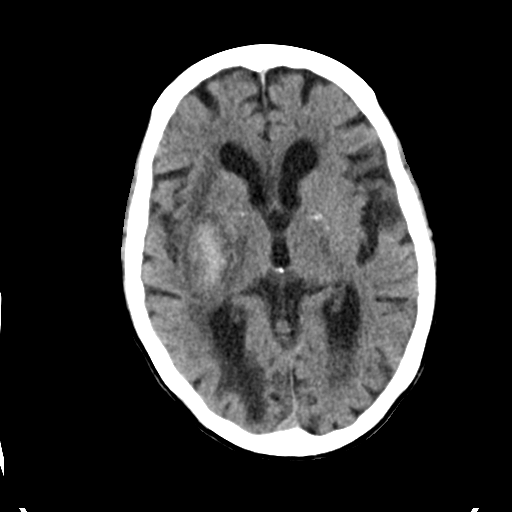
[im 15/29  brain]
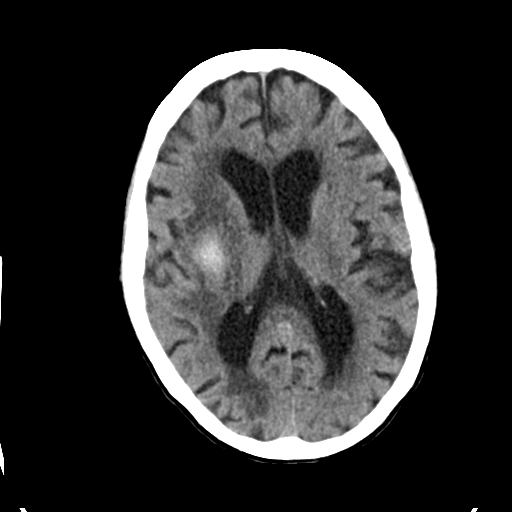
[im 17/29  brain]
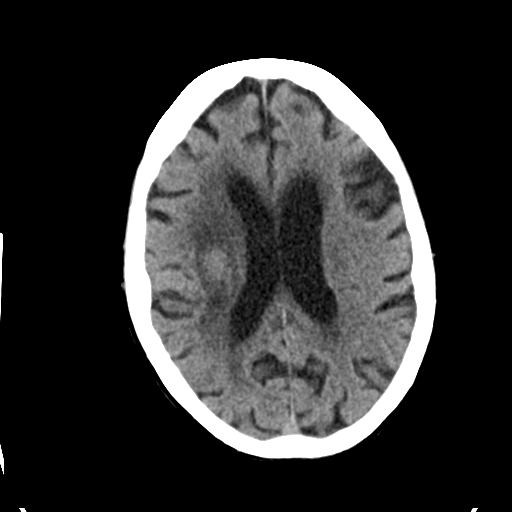
[im 17/29  bone]
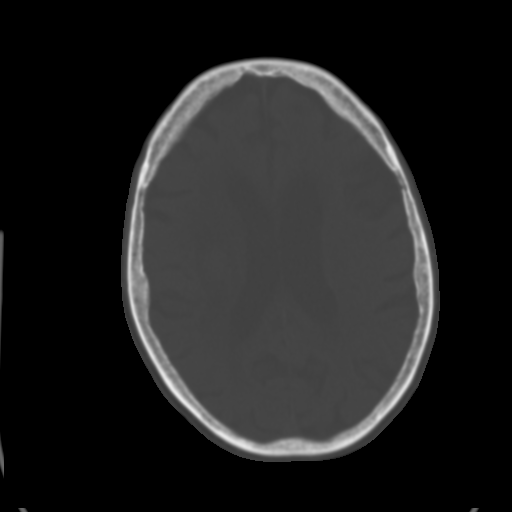
[im 19/29  brain]
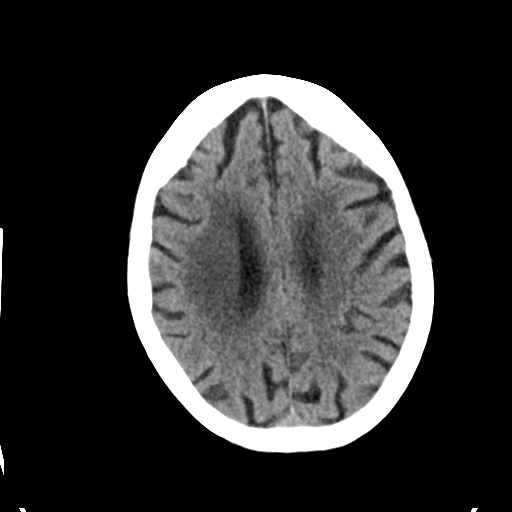
[im 20/29  brain]
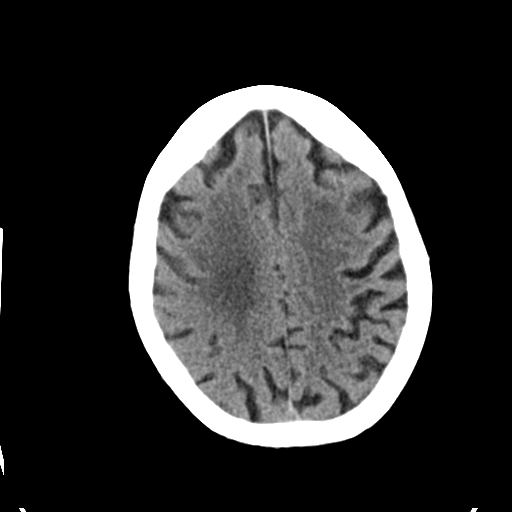
[im 22/29  brain]
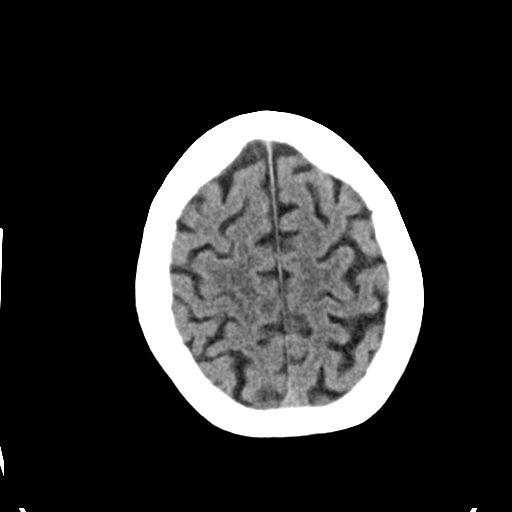
[im 24/29  brain]
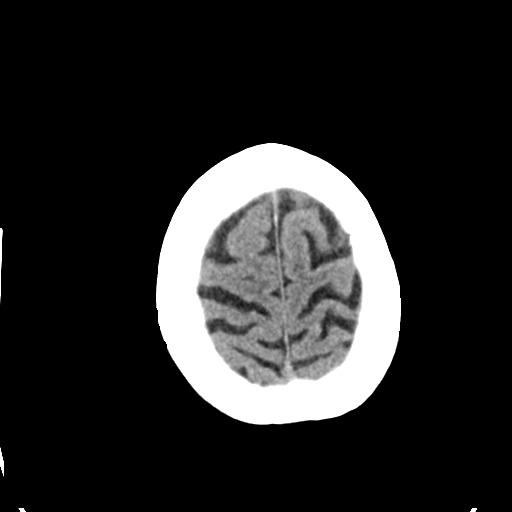
[im 24/29  bone]
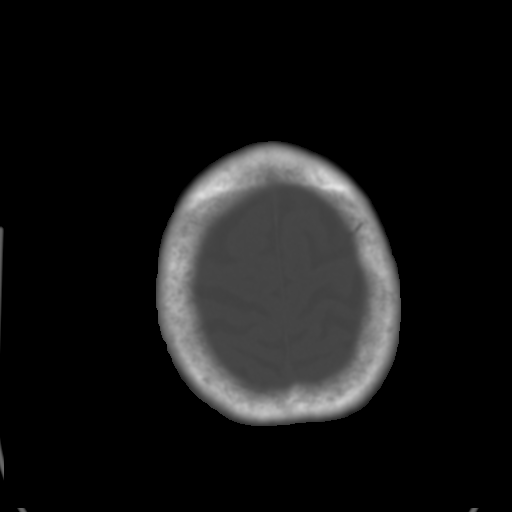
[im 26/29  brain]
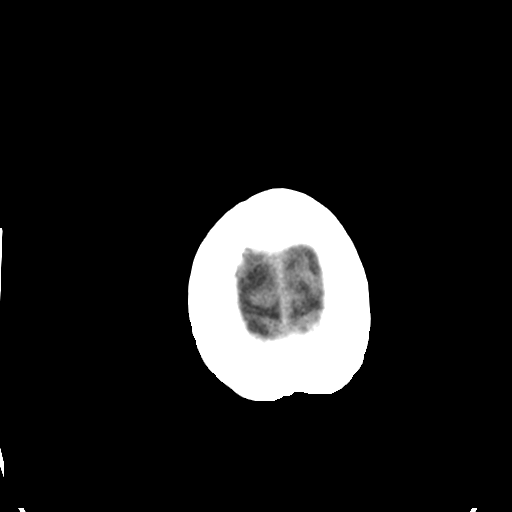
[im 28/29  brain]
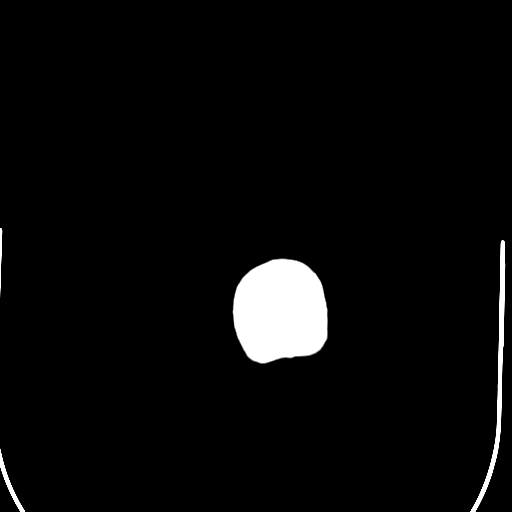

[15 of 29 positions shown; findings below may reference images not displayed]

FINDINGS: The right lenticular nucleus hemorrhage with extension into the
right corona radiata is less dense than on the prior examination
consistent with red cell lysis cysts with surrounding vasogenic
edema. Although slow, this represent expected evolution of
intracranial hemorrhage. Mass-effect upon the right ventricle has
decreased in the interim.

MR detected tiny amount hemorrhage left parietal lobe not
appreciated on present CT.

Remote right occipital lobe infarct.

Small vessel disease type changes without CT evidence of large acute
thrombotic infarct.

No intracranial mass lesion separate from above described findings.

Atrophy. Ventricular prominence probably related to atrophy rather
than hydrocephalus

Tiny amount of gas in the left cavernous sinus region may be related
to IV.
IMPRESSION: The right lenticular nucleus hemorrhage with extension into the
right corona radiata is less dense than on the prior examination
consistent with red cell lysis cysts with surrounding vasogenic
edema. Although slow, this represent expected evolution of
intracranial hemorrhage. Mass-effect upon the right ventricle has
decreased in the interim.

## 2014-08-19 ENCOUNTER — Encounter (HOSPITAL_COMMUNITY): Payer: Self-pay | Admitting: Cardiology
